# Patient Record
Sex: Male | Born: 1960 | Race: White | Hispanic: No | Smoking: Current every day smoker
Health system: Southern US, Community
[De-identification: ages and names within clinical notes are randomized; demographics above are authoritative.]

## PROBLEM LIST (undated history)

## (undated) DIAGNOSIS — I2510 Atherosclerotic heart disease of native coronary artery without angina pectoris: Secondary | ICD-10-CM

## (undated) DIAGNOSIS — E119 Type 2 diabetes mellitus without complications: Secondary | ICD-10-CM

## (undated) DIAGNOSIS — Q211 Atrial septal defect, unspecified: Secondary | ICD-10-CM

## (undated) DIAGNOSIS — Z8774 Personal history of (corrected) congenital malformations of heart and circulatory system: Secondary | ICD-10-CM

## (undated) DIAGNOSIS — I10 Essential (primary) hypertension: Secondary | ICD-10-CM

## (undated) DIAGNOSIS — M1990 Unspecified osteoarthritis, unspecified site: Secondary | ICD-10-CM

## (undated) DIAGNOSIS — R0902 Hypoxemia: Secondary | ICD-10-CM

## (undated) DIAGNOSIS — Z720 Tobacco use: Secondary | ICD-10-CM

## (undated) DIAGNOSIS — J449 Chronic obstructive pulmonary disease, unspecified: Secondary | ICD-10-CM

## (undated) DIAGNOSIS — E669 Obesity, unspecified: Secondary | ICD-10-CM

## (undated) DIAGNOSIS — I493 Ventricular premature depolarization: Secondary | ICD-10-CM

## (undated) DIAGNOSIS — R05 Cough: Secondary | ICD-10-CM

## (undated) DIAGNOSIS — D751 Secondary polycythemia: Secondary | ICD-10-CM

## (undated) DIAGNOSIS — Z1379 Encounter for other screening for genetic and chromosomal anomalies: Principal | ICD-10-CM

## (undated) DIAGNOSIS — J189 Pneumonia, unspecified organism: Secondary | ICD-10-CM

## (undated) DIAGNOSIS — E785 Hyperlipidemia, unspecified: Secondary | ICD-10-CM

## (undated) DIAGNOSIS — E039 Hypothyroidism, unspecified: Secondary | ICD-10-CM

## (undated) DIAGNOSIS — Z8719 Personal history of other diseases of the digestive system: Secondary | ICD-10-CM

## (undated) DIAGNOSIS — R7303 Prediabetes: Secondary | ICD-10-CM

## (undated) DIAGNOSIS — Z800 Family history of malignant neoplasm of digestive organs: Secondary | ICD-10-CM

## (undated) HISTORY — PX: TONSILLECTOMY: SUR1361

## (undated) HISTORY — DX: Secondary polycythemia: D75.1

## (undated) HISTORY — DX: Obesity, unspecified: E66.9

## (undated) HISTORY — DX: Encounter for other screening for genetic and chromosomal anomalies: Z13.79

## (undated) HISTORY — DX: Hyperlipidemia, unspecified: E78.5

## (undated) HISTORY — DX: Family history of malignant neoplasm of digestive organs: Z80.0

## (undated) HISTORY — PX: OTHER SURGICAL HISTORY: SHX169

## (undated) HISTORY — DX: Atrial septal defect, unspecified: Q21.10

## (undated) HISTORY — DX: Atrial septal defect: Q21.1

## (undated) HISTORY — DX: Atherosclerotic heart disease of native coronary artery without angina pectoris: I25.10

## (undated) HISTORY — DX: Cough: R05

## (undated) HISTORY — DX: Tobacco use: Z72.0

## (undated) HISTORY — DX: Prediabetes: R73.03

## (undated) HISTORY — DX: Personal history of (corrected) congenital malformations of heart and circulatory system: Z87.74

## (undated) HISTORY — DX: Hypothyroidism, unspecified: E03.9

---
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
## 2003-10-03 ENCOUNTER — Encounter: Admission: RE | Admit: 2003-10-03 | Discharge: 2003-10-03 | Payer: Self-pay | Admitting: Internal Medicine

## 2004-11-24 HISTORY — PX: OTHER SURGICAL HISTORY: SHX169

## 2007-01-04 ENCOUNTER — Emergency Department (HOSPITAL_COMMUNITY): Admission: EM | Admit: 2007-01-04 | Discharge: 2007-01-05 | Payer: Self-pay | Admitting: Emergency Medicine

## 2009-01-05 ENCOUNTER — Encounter: Admission: RE | Admit: 2009-01-05 | Discharge: 2009-01-05 | Payer: Self-pay | Admitting: Internal Medicine

## 2009-08-27 ENCOUNTER — Ambulatory Visit: Payer: Self-pay | Admitting: Cardiovascular Disease

## 2009-08-27 ENCOUNTER — Encounter: Payer: Self-pay | Admitting: Emergency Medicine

## 2009-08-28 ENCOUNTER — Inpatient Hospital Stay (HOSPITAL_COMMUNITY): Admission: EM | Admit: 2009-08-28 | Discharge: 2009-08-29 | Payer: Self-pay | Admitting: Cardiology

## 2009-08-28 HISTORY — PX: CARDIAC CATHETERIZATION: SHX172

## 2009-11-24 HISTORY — PX: COLONOSCOPY: SHX174

## 2010-06-19 ENCOUNTER — Encounter: Admission: RE | Admit: 2010-06-19 | Discharge: 2010-06-19 | Payer: Self-pay | Admitting: Internal Medicine

## 2010-07-06 ENCOUNTER — Encounter: Admission: RE | Admit: 2010-07-06 | Discharge: 2010-07-06 | Payer: Self-pay | Admitting: Gastroenterology

## 2010-07-09 ENCOUNTER — Ambulatory Visit: Payer: Self-pay | Admitting: Cardiology

## 2011-01-16 ENCOUNTER — Ambulatory Visit (INDEPENDENT_AMBULATORY_CARE_PROVIDER_SITE_OTHER): Payer: PRIVATE HEALTH INSURANCE | Admitting: Cardiology

## 2011-01-16 DIAGNOSIS — I10 Essential (primary) hypertension: Secondary | ICD-10-CM

## 2011-01-16 DIAGNOSIS — F17200 Nicotine dependence, unspecified, uncomplicated: Secondary | ICD-10-CM

## 2011-01-16 DIAGNOSIS — E7800 Pure hypercholesterolemia, unspecified: Secondary | ICD-10-CM

## 2011-02-27 LAB — LIPID PANEL
Cholesterol: 196 mg/dL (ref 0–200)
HDL: 33 mg/dL — ABNORMAL LOW (ref >39)
LDL Cholesterol: 116 mg/dL — ABNORMAL HIGH (ref 0–99)
Total CHOL/HDL Ratio: 5.9 RATIO
Triglycerides: 235 mg/dL — ABNORMAL HIGH (ref <150)
VLDL: 47 mg/dL — ABNORMAL HIGH (ref 0–40)

## 2011-02-27 LAB — BASIC METABOLIC PANEL
BUN: 14 mg/dL (ref 6–23)
BUN: 26 mg/dL — ABNORMAL HIGH (ref 6–23)
BUN: 26 mg/dL — ABNORMAL HIGH (ref 6–23)
CO2: 26 mEq/L (ref 19–32)
CO2: 27 mEq/L (ref 19–32)
CO2: 25 mEq/L (ref 19–32)
Calcium: 8.8 mg/dL (ref 8.4–10.5)
Calcium: 9 mg/dL (ref 8.4–10.5)
Calcium: 9.6 mg/dL (ref 8.4–10.5)
Chloride: 106 mEq/L (ref 96–112)
Chloride: 108 mEq/L (ref 96–112)
Chloride: 103 mEq/L (ref 96–112)
Creatinine, Ser: 1.02 mg/dL (ref 0.4–1.5)
Creatinine, Ser: 1.09 mg/dL (ref 0.4–1.5)
Creatinine, Ser: 1.13 mg/dL (ref 0.4–1.5)
GFR calc Af Amer: >60 mL/min (ref >60)
GFR calc Af Amer: >60 mL/min (ref >60)
GFR calc Af Amer: >60 mL/min (ref >60)
GFR calc non Af Amer: >60 mL/min (ref >60)
GFR calc non Af Amer: >60 mL/min (ref >60)
GFR calc non Af Amer: >60 mL/min (ref >60)
Glucose, Bld: 122 mg/dL — ABNORMAL HIGH (ref 70–99)
Glucose, Bld: 127 mg/dL — ABNORMAL HIGH (ref 70–99)
Glucose, Bld: 119 mg/dL — ABNORMAL HIGH (ref 70–99)
Potassium: 3.9 mEq/L (ref 3.5–5.1)
Potassium: 4.1 mEq/L (ref 3.5–5.1)
Potassium: 3.9 mEq/L (ref 3.5–5.1)
Sodium: 140 mEq/L (ref 135–145)
Sodium: 142 mEq/L (ref 135–145)
Sodium: 139 mEq/L (ref 135–145)

## 2011-02-27 LAB — DIFFERENTIAL
Basophils Absolute: 0 10*3/uL (ref 0.0–0.1)
Basophils Relative: 0 % (ref 0–1)
Eosinophils Absolute: 0.2 10*3/uL (ref 0.0–0.7)
Eosinophils Relative: 2 % (ref 0–5)
Lymphocytes Relative: 30 % (ref 12–46)
Lymphs Abs: 4 10*3/uL (ref 0.7–4.0)
Monocytes Absolute: 1.2 10*3/uL — ABNORMAL HIGH (ref 0.1–1.0)
Monocytes Relative: 9 % (ref 3–12)
Neutro Abs: 7.9 10*3/uL — ABNORMAL HIGH (ref 1.7–7.7)
Neutrophils Relative %: 59 % (ref 43–77)

## 2011-02-27 LAB — POCT CARDIAC MARKERS
CKMB, poc: 1.3 ng/mL (ref 1.0–8.0)
CKMB, poc: 1.6 ng/mL (ref 1.0–8.0)
Myoglobin, poc: 114 ng/mL (ref 12–200)
Myoglobin, poc: 71.5 ng/mL (ref 12–200)
Troponin i, poc: <0.05 ng/mL (ref 0.00–0.09)
Troponin i, poc: <0.05 ng/mL (ref 0.00–0.09)

## 2011-02-27 LAB — CBC
HCT: 44.9 % (ref 39.0–52.0)
HCT: 47.9 % (ref 39.0–52.0)
Hemoglobin: 15.1 g/dL (ref 13.0–17.0)
Hemoglobin: 16.6 g/dL (ref 13.0–17.0)
MCHC: 33.7 g/dL (ref 30.0–36.0)
MCHC: 34.7 g/dL (ref 30.0–36.0)
MCV: 93.7 fL (ref 78.0–100.0)
MCV: 92.5 fL (ref 78.0–100.0)
Platelets: 181 10*3/uL (ref 150–400)
Platelets: 216 10*3/uL (ref 150–400)
RBC: 4.79 MIL/uL (ref 4.22–5.81)
RBC: 5.17 MIL/uL (ref 4.22–5.81)
RDW: 13.3 % (ref 11.5–15.5)
RDW: 13 % (ref 11.5–15.5)
WBC: 8.5 10*3/uL (ref 4.0–10.5)
WBC: 13.3 10*3/uL — ABNORMAL HIGH (ref 4.0–10.5)

## 2011-02-27 LAB — CARDIAC PANEL(CRET KIN+CKTOT+MB+TROPI)
CK, MB: 1.1 ng/mL (ref 0.3–4.0)
CK, MB: 1.3 ng/mL (ref 0.3–4.0)
CK, MB: 1.4 ng/mL (ref 0.3–4.0)
Relative Index: 0.7 (ref 0.0–2.5)
Relative Index: 0.8 (ref 0.0–2.5)
Relative Index: 0.8 (ref 0.0–2.5)
Total CK: 160 U/L (ref 7–232)
Total CK: 172 U/L (ref 7–232)
Total CK: 184 U/L (ref 7–232)
Troponin I: 0.01 ng/mL (ref 0.00–0.06)
Troponin I: 0.01 ng/mL (ref 0.00–0.06)
Troponin I: 0.01 ng/mL (ref 0.00–0.06)

## 2011-02-27 LAB — PROTIME-INR
INR: 0.95 (ref 0.00–1.49)
Prothrombin Time: 12.6 seconds (ref 11.6–15.2)

## 2011-02-27 LAB — TSH: TSH: 5.085 u[IU]/mL — ABNORMAL HIGH (ref 0.350–4.500)

## 2011-05-08 ENCOUNTER — Other Ambulatory Visit: Payer: Self-pay | Admitting: *Deleted

## 2011-05-08 MED ORDER — METOPROLOL TARTRATE 50 MG PO TABS: ORAL_TABLET | ORAL | Status: DC

## 2011-05-08 NOTE — Telephone Encounter (Signed)
Fax received from pharmacy. Refill completed. Jodette Domingos Riggi RN  

## 2011-12-30 ENCOUNTER — Telehealth: Payer: Self-pay | Admitting: Cardiology

## 2011-12-30 NOTE — Telephone Encounter (Signed)
LOV,StressEcho,12 ( [place] Card) faxed to [place] @ [phone] 12/30/11/KM

## 2012-01-08 ENCOUNTER — Telehealth: Payer: Self-pay

## 2012-01-08 NOTE — Telephone Encounter (Signed)
ATTN: medical records. Patient needs to pick up a copy of his office notes for 09/21/11 and 10/27/11. Please call him when they are ready. Thank you

## 2012-01-29 NOTE — Telephone Encounter (Signed)
Record request printed and done by Elmyra Ricks.

## 2012-01-29 NOTE — Telephone Encounter (Signed)
Has this been taken care of,please f/u

## 2012-02-10 ENCOUNTER — Other Ambulatory Visit: Payer: Self-pay | Admitting: Cardiology

## 2012-02-13 ENCOUNTER — Encounter: Payer: Self-pay | Admitting: *Deleted

## 2012-02-16 ENCOUNTER — Ambulatory Visit (INDEPENDENT_AMBULATORY_CARE_PROVIDER_SITE_OTHER): Payer: PRIVATE HEALTH INSURANCE | Admitting: Nurse Practitioner

## 2012-02-16 ENCOUNTER — Encounter: Payer: Self-pay | Admitting: Nurse Practitioner

## 2012-02-16 VITALS — BP 122/100 | HR 68 | Ht 73.0 in | Wt 248.6 lb

## 2012-02-16 DIAGNOSIS — I2510 Atherosclerotic heart disease of native coronary artery without angina pectoris: Secondary | ICD-10-CM

## 2012-02-16 DIAGNOSIS — Z720 Tobacco use: Secondary | ICD-10-CM

## 2012-02-16 DIAGNOSIS — F17200 Nicotine dependence, unspecified, uncomplicated: Secondary | ICD-10-CM

## 2012-02-16 MED ORDER — METOPROLOL TARTRATE 50 MG PO TABS: 25.0000 mg | ORAL_TABLET | Freq: Two times a day (BID) | ORAL | Status: DC

## 2012-02-16 NOTE — Assessment & Plan Note (Signed)
He has mild CAD per cath back in 2010. Has multiple CV risk factors. Smoking cessation is encouraged. He has had recent labs with his PCP. I have refilled his metoprolol. We will tentatively see him back in one year. Would give consideration for repeat stress testing on his return visit. Patient is agreeable to this plan and will call if any problems develop in the interim.

## 2012-02-16 NOTE — Progress Notes (Signed)
   Chris Long Date of Birth: 1961/10/15 Medical Record #[id number]  History of Present Illness: Mr. Stooksbury is seen today for a follow up visit. He is seen for Dr. [place]. He is a former patient of Dr. Ronnald Nian. He has known mild CAD per cath back in 2010. Last stress echo in 2006. Has had remote ASD repair in 1972. He has ongoing tobacco abuse and HLD. He is here for medication refills.  He comes in today. He is here alone. He is doing ok from our standpoint. No chest pain. Not short of breath. He did try to stop smoking for about 3 weeks last year. Then his mom died. His dog died. His partner's mother died. He returned to smoking. He fell earlier this year and tore his rotator cuff. He had surgery back in February and did fine. He has had his labs checked with Dr. Timothy Lasso.   Current Outpatient Prescriptions on File Prior to Visit  Medication Sig Dispense Refill  . aspirin 325 MG tablet Take 325 mg by mouth daily.      . fish oil-omega-3 fatty acids 1000 MG capsule Take 2 g by mouth daily.      Marland Kitchen levothyroxine (SYNTHROID, LEVOTHROID) 88 MCG tablet Take 88 mcg by mouth daily.      . metoprolol (LOPRESSOR) 50 MG tablet TAKE 1/2 TABLET BY MOUTH TWICE DAILY  15 tablet  0  . Multiple Vitamin (MULTIVITAMIN) capsule Take 1 capsule by mouth daily.      . rosuvastatin (CRESTOR) 10 MG tablet Take 10 mg by mouth daily.        No Known Allergies  Past Medical History  Diagnosis Date  . Coronary artery disease     Mild CAD per cath in 2010  . Hyperlipidemia   . Hypothyroidism   . Obesity   . Tobacco abuse   . ASD (atrial septal defect)     with repair in 1972    Past Surgical History  Procedure Date  . Cardiac catheterization 08/28/2009    EF 60%; Mild CAD with normal LV function  . Asd repair [id number]  . Stress echo test 2006    NO ISCHEMIA, NORMAL    History  Smoking status  . Current Everyday Smoker  Smokeless tobacco  . Not on file    History  Alcohol Use No     Family History  Problem Relation Age of Onset  . Heart attack Mother     X2  . Hypertension Father     Review of Systems: The review of systems is per the HPI.  All other systems were reviewed and are negative.  Physical Exam: BP 122/100  Pulse 68  Ht 6\' 1"  (1.854 m)  Wt 248 lb 9.6 oz (112.764 kg)  BMI 32.80 kg/m2 Patient is very pleasant and in no acute distress. Skin is warm and dry. Color is normal.  HEENT is unremarkable. Normocephalic/atraumatic. PERRL. Sclera are nonicteric. Neck is supple. No masses. No JVD. Lungs are clear. Cardiac exam shows a regular rate and rhythm. Abdomen is soft. Extremities are without edema. Gait and ROM are intact. No gross neurologic deficits noted.   LABORATORY DATA:   Assessment / Plan:

## 2012-02-16 NOTE — Assessment & Plan Note (Signed)
Smoking cessation is encouraged 

## 2012-02-16 NOTE — Patient Instructions (Signed)
I encourage you to try and stop smoking.  I have refilled your Metoprolol today.  Stay active.  We will see you in a year.  Call the [facility] office at [phone] if you have any questions, problems or concerns.

## 2012-04-27 ENCOUNTER — Ambulatory Visit (INDEPENDENT_AMBULATORY_CARE_PROVIDER_SITE_OTHER): Payer: PRIVATE HEALTH INSURANCE | Admitting: Family Medicine

## 2012-04-27 VITALS — BP 146/82 | HR 88 | Temp 98.3°F | Resp 20 | Ht 72.0 in | Wt 247.2 lb

## 2012-04-27 DIAGNOSIS — H5710 Ocular pain, unspecified eye: Secondary | ICD-10-CM

## 2012-04-27 DIAGNOSIS — H109 Unspecified conjunctivitis: Secondary | ICD-10-CM

## 2012-04-27 MED ORDER — TOBRAMYCIN 0.3 % OP SOLN: 1.0000 [drp] | Freq: Four times a day (QID) | OPHTHALMIC | Status: AC

## 2012-04-27 NOTE — Patient Instructions (Signed)
Eye, Foreign Body  A foreign body is an object that should not be there. The object could be near, on, or in the eye.  HOME CARE  If your doctor prescribes an eye patch:   Keep the eye patch on. Do this until you see your doctor again.   Do not remove the patch to put in medicine unless your doctor tells you.   Retape it as it was before:   When replacing the patch.   If the patch comes loose.   Do not drive or use machinery.   Only take medicine as told by your doctor.  If your doctor does not prescribe an eye patch:   Keep the eye closed as much as possible.   Do not rub the eye.   Wear dark glasses in bright light.   Do not wear contact lenses until the eye feels normal, or as told by your doctor.   Wear protective eye covering, especially when using high speed tools.   Only take medicine as told by your doctor.  GET HELP RIGHT AWAY IF:    Your pain gets worse.   Your vision changes.   You have problems with the eye patch.   The injury gets larger.   There is fluid (discharge) coming from the eye.   You get puffiness (swelling) and soreness.   You have an oral temperature above 102 F (38.9 C), not controlled by medicine.   Your baby is older than 3 months with a rectal temperature of 102 F (38.9 C) or higher.   Your baby is 3 months old or younger with a rectal temperature of 100.4 F (38 C) or higher.  MAKE SURE YOU:    Understand these instructions.   Will watch your condition.   Will get help right away if you are not doing well or get worse.  Document Released: 04/30/2010 Document Revised: 10/30/2011 Document Reviewed: 04/30/2010  [employer] Patient Information 2012 [employer].

## 2012-04-27 NOTE — Progress Notes (Signed)
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    O:  Eyelash embedded into the conjunctiva Eye anesth with proparacaine Eyelash removed  A:  Conjunctival f.b./inflammation  P: tobrex

## 2012-08-18 ENCOUNTER — Ambulatory Visit
Admission: RE | Admit: 2012-08-18 | Discharge: 2012-08-18 | Disposition: A | Discharge status: Home / Self Care | Payer: PRIVATE HEALTH INSURANCE | Source: Ambulatory Visit | Attending: Internal Medicine | Admitting: Internal Medicine

## 2012-08-18 ENCOUNTER — Other Ambulatory Visit: Payer: Self-pay | Admitting: Internal Medicine

## 2012-08-18 DIAGNOSIS — R109 Unspecified abdominal pain: Secondary | ICD-10-CM

## 2012-08-18 DIAGNOSIS — R319 Hematuria, unspecified: Secondary | ICD-10-CM

## 2012-11-04 ENCOUNTER — Telehealth: Payer: Self-pay | Admitting: Oncology

## 2012-11-04 NOTE — Telephone Encounter (Signed)
C/D 11/04/12 for appt. 11/12/12

## 2012-11-04 NOTE — Telephone Encounter (Signed)
S/W pt in re NP appt 12/20 @ 10:30 w/Dr. Gaylyn Rong.  Referring Dr. [name] Dx-Eryhrocythosis Welcome packet mailed.

## 2012-11-11 ENCOUNTER — Encounter: Payer: Self-pay | Admitting: Oncology

## 2012-11-11 DIAGNOSIS — D751 Secondary polycythemia: Secondary | ICD-10-CM | POA: Insufficient documentation

## 2012-11-12 ENCOUNTER — Ambulatory Visit: Payer: PRIVATE HEALTH INSURANCE

## 2012-11-12 ENCOUNTER — Encounter: Payer: Self-pay | Admitting: Oncology

## 2012-11-12 ENCOUNTER — Telehealth: Payer: Self-pay | Admitting: Oncology

## 2012-11-12 ENCOUNTER — Other Ambulatory Visit (HOSPITAL_BASED_OUTPATIENT_CLINIC_OR_DEPARTMENT_OTHER): Payer: PRIVATE HEALTH INSURANCE | Admitting: Lab

## 2012-11-12 ENCOUNTER — Ambulatory Visit (HOSPITAL_BASED_OUTPATIENT_CLINIC_OR_DEPARTMENT_OTHER): Payer: PRIVATE HEALTH INSURANCE | Admitting: Oncology

## 2012-11-12 VITALS — BP 128/78 | HR 73 | Temp 97.9°F | Resp 18 | Ht 73.0 in | Wt 217.0 lb

## 2012-11-12 DIAGNOSIS — D45 Polycythemia vera: Secondary | ICD-10-CM

## 2012-11-12 DIAGNOSIS — F17200 Nicotine dependence, unspecified, uncomplicated: Secondary | ICD-10-CM

## 2012-11-12 DIAGNOSIS — D751 Secondary polycythemia: Secondary | ICD-10-CM

## 2012-11-12 LAB — CBC WITH DIFFERENTIAL/PLATELET
Basophils Absolute: 0.1 10*3/uL (ref 0.0–0.1)
Eosinophils Absolute: 0.2 10*3/uL (ref 0.0–0.5)
HCT: 51.9 % — ABNORMAL HIGH (ref 38.4–49.9)
HGB: 18 g/dL — ABNORMAL HIGH (ref 13.0–17.1)
LYMPH%: 33 % (ref 14.0–49.0)
MCV: 93 fL (ref 79.3–98.0)
MONO#: 0.8 10*3/uL (ref 0.1–0.9)
MONO%: 8.8 % (ref 0.0–14.0)
NEUT#: 5.2 10*3/uL (ref 1.5–6.5)
NEUT%: 55.4 % (ref 39.0–75.0)
Platelets: 202 10*3/uL (ref 140–400)
RBC: 5.58 10*6/uL (ref 4.20–5.82)
WBC: 9.4 10*3/uL (ref 4.0–10.3)

## 2012-11-12 LAB — CHCC SMEAR

## 2012-11-12 NOTE — Progress Notes (Signed)
[facility]  [phone] [phone]     INITIAL HEMATOLOGY CONSULTATION    Referral MD:  Dr. [name], M.D.  Reason for Referral: polycythemia.     HPI:  Mr. Chris Long is a 51 year-old man with current smoking habit, obesity among others.  He was recently noted by his PCP for slight leukocytosis and polycythemia.  A routine CBC on 11/01/2012 showed WBC 11.4; Hgb 19; Plt 211.  He was kindly referred to the [facility] for evaluation.  Chris Long presented to the Clinic by himself today.  He reported felling well.  He has been trying to lose weight intentionally. His face is rudy; however, this has been like this for years.  He denied fatigue, anorexia, adenopathy, erythromelalgia.  The rest of the 14 point review of system was negative.     Past Medical History  Diagnosis Date  . Coronary artery disease     Mild CAD per cath in 2010  . Hyperlipidemia   . Hypothyroidism   . Obesity   . Tobacco abuse   . ASD (atrial septal defect)     with repair in 1972  . Polycythemia   . Prediabetes   :    Past Surgical History  Procedure Date  . Cardiac catheterization 08/28/2009    EF 60%; Mild CAD with normal LV function  . Asd repair [id number]  . Stress echo test 2006    NO ISCHEMIA, NORMAL  . Tonsillectomy   . Colonoscopy 2011    outlaw; 5 polyps   :   CURRENT MEDS: Current Outpatient Prescriptions  Medication Sig Dispense Refill  . aspirin 325 MG tablet Take 325 mg by mouth daily.      . fish oil-omega-3 fatty acids 1000 MG capsule Take 2 g by mouth daily.      Marland Kitchen levothyroxine (SYNTHROID, LEVOTHROID) 88 MCG tablet Take 88 mcg by mouth daily.      . metoprolol (LOPRESSOR) 50 MG tablet Take 0.5 tablets (25 mg total) by mouth 2 (two) times daily.  90 tablet  3  . Multiple Vitamin (MULTIVITAMIN) capsule Take 1 capsule by mouth daily.      . rosuvastatin (CRESTOR) 10 MG tablet Take 20 mg by mouth daily.           No  Known Allergies:  Family History  Problem Relation Age of Onset  . Heart attack Mother     X2  . Heart failure Mother   . Hypertension Father   . COPD Father   . Cancer Father     bladder  . Cancer Paternal Uncle     GI cancer  :  History   Social History  . Marital Status: Divorced    Spouse Name: N/A    Number of Children: 0  . Years of Education: N/A   Occupational History  .      real estate.     Social History Main Topics  . Smoking status: Current Every Day Smoker -- 0.5 packs/day for 30 years    Types: Cigarettes  . Smokeless tobacco: Never Used  . Alcohol Use: No  . Drug Use: No  . Sexually Active: Yes   Other Topics Concern  . Not on file   Social History Narrative  . No narrative on file  :  REVIEW OF SYSTEM:  The rest of the 14-point review of sytem was negative.   Exam: ECOG 0  General:  well-nourished man, in  no acute distress.  Eyes:  no scleral icterus.  ENT:  There were no oropharyngeal lesions.  Neck was without thyromegaly.  Lymphatics:  Negative cervical, supraclavicular or axillary adenopathy.  Respiratory: lungs were clear bilaterally without wheezing or crackles.  Cardiovascular:  Regular rate and rhythm, S1/S2, without murmur, rub or gallop.  There was no pedal edema.  GI:  abdomen was soft, flat, nontender, nondistended, without organomegaly.  Muscoloskeletal:  no spinal tenderness of palpation of vertebral spine.  Skin exam was without echymosis, petichae.  Neuro exam was nonfocal.  Patient was able to get on and off exam table without assistance.  Gait was normal.  Patient was alerted and oriented.  Attention was good.   Language was appropriate.  Mood was normal without depression.  Speech was not pressured.  Thought content was not tangential.    LABS:  Lab Results  Component Value Date   WBC 9.4 11/12/2012   HGB 18.0* 11/12/2012   HCT 51.9* 11/12/2012   PLT 202 11/12/2012   GLUCOSE 127* 08/29/2009   CHOL  Value: 196        ATP III  CLASSIFICATION:  <200     mg/dL   Desirable  161-096  mg/dL   Borderline High  >=045    mg/dL   High        40/07/8118   TRIG 235* 08/28/2009   HDL 33* 08/28/2009   LDLCALC  Value: 116        Total Cholesterol/HDL:CHD Risk Coronary Heart Disease Risk Table                     Men   Women  1/2 Average Risk   3.4   3.3  Average Risk       5.0   4.4  2 X Average Risk   9.6   7.1  3 X Average Risk  23.4   11.0        Use the calculated Patient Ratio above and the CHD Risk Table to determine the patient's CHD Risk.        ATP III CLASSIFICATION (LDL):  <100     mg/dL   Optimal  147-829  mg/dL   Near or Above                    Optimal  130-159  mg/dL   Borderline  562-130  mg/dL   High  >865     mg/dL   Very High* 78/02/6961   NA 142 08/29/2009   K 4.1 08/29/2009   CL 108 08/29/2009   CREATININE 1.02 08/29/2009   BUN 14 DELTA CHECK NOTED 08/29/2009   CO2 26 08/29/2009   INR 0.95 08/28/2009     ASSESSMENT AND PLAN:   1.  Smoking:  I advised him to stop smoking since he has family history of vascular disease.  He is trying to use electronic cigarettes. 2.  HLP:  He is on rosuvastatin. 3.  Hypothyroidism:  He is on Synthroid. 4.  Polycythemia.  - Potential causes:  Smoking, COPD, obesity, sleep apnea.  Need to rule out polycythemia vera (PV).  PV is a condition where the bone marrow independently produces red blood cell without feedback mechanism.  PV patients are at slightly higher risk of blood clot. -  Work up:  JAK-2 mutation testing.   Consider work up for sleep apnea if hasn't done so.   - Treatment:  Aspirin 325mg  PO daily.  Stop smoking.  CPAP if he has OSA.  Even if testing shows PV, there is no indication for Hydrea given that he is only 52 years old without history of thrombosis. I will arrange for phlebotomy if he has PV.  There is no benefit in phlebotomy in patients with secondary polycythemia.  -  Follow up: CBC at the [facility] in about 6 months.  Follow up in about 1 year.  In the future, if  your Hgb significantly increases, we may consider bone marrow biopsy.  At this time, bone marrow biopsy has low utility.   Chris Long expressed informed understanding and wished to follow up with the recommendation.     Thank you for this referral.

## 2012-11-12 NOTE — Telephone Encounter (Signed)
appts made from chart note,no pof     anne

## 2012-11-12 NOTE — Patient Instructions (Addendum)
1.  Issue:  Elevated red blood cell. 2.  Potential causes:  Smoking, COPD, obesity, sleep apnea.  Need to rule out polycythemia vera (PV).  PV is a condition where the bone marrow independently produces red blood cell without feedback mechanism.  PV patients are at slightly higher risk of blood clot. 3.  Work up:  JAK-2 mutation testing.   Consider work up for sleep apnea if hasn't done so? 4.  Follow up:  Lab test in about 6 months.  Follow up in about 1 year.  In the future, if your Hgb significantly increases, we may consider bone marrow biopsy.  At this time, bone marrow biopsy has low utility.

## 2012-11-12 NOTE — Progress Notes (Signed)
Checked in new pt with non financial concerns.

## 2012-11-13 LAB — HEPATIC FUNCTION PANEL
ALT: 23 U/L (ref 0–53)
AST: 17 U/L (ref 0–37)
Albumin: 4.3 g/dL (ref 3.5–5.2)
Alkaline Phosphatase: 65 U/L (ref 39–117)
Total Bilirubin: 0.6 mg/dL (ref 0.3–1.2)

## 2012-11-18 ENCOUNTER — Encounter: Payer: Self-pay | Admitting: Oncology

## 2012-12-25 ENCOUNTER — Ambulatory Visit: Payer: BC Managed Care – PPO

## 2012-12-25 ENCOUNTER — Ambulatory Visit (INDEPENDENT_AMBULATORY_CARE_PROVIDER_SITE_OTHER): Payer: BC Managed Care – PPO | Admitting: Family Medicine

## 2012-12-25 VITALS — BP 124/79 | HR 96 | Temp 98.0°F | Resp 16 | Ht 73.0 in | Wt 242.6 lb

## 2012-12-25 DIAGNOSIS — M79605 Pain in left leg: Secondary | ICD-10-CM

## 2012-12-25 DIAGNOSIS — M79662 Pain in left lower leg: Secondary | ICD-10-CM

## 2012-12-25 DIAGNOSIS — S86119A Strain of other muscle(s) and tendon(s) of posterior muscle group at lower leg level, unspecified leg, initial encounter: Secondary | ICD-10-CM

## 2012-12-25 DIAGNOSIS — S838X9A Sprain of other specified parts of unspecified knee, initial encounter: Secondary | ICD-10-CM

## 2012-12-25 DIAGNOSIS — S86819A Strain of other muscle(s) and tendon(s) at lower leg level, unspecified leg, initial encounter: Secondary | ICD-10-CM

## 2012-12-25 DIAGNOSIS — M79609 Pain in unspecified limb: Secondary | ICD-10-CM

## 2012-12-25 NOTE — Patient Instructions (Signed)
Medial Head Gastrocnemius Tear (Tennis Leg)  with Rehab Medial head gastrocnemius tear, also called tennis leg, is a tear (strain) in a muscle or tendon of the inner portion (medial head) of one of the calf muscles (gastrocnemius). The inner portion of the calf muscle attaches to the thigh bone (femur) and is responsible for bending the knee and straightening the foot (standing on "tippy toes"). Strains are classified into three categories. Grade 1 strains cause pain, but the tendon is not lengthened. Grade 2 strains include a lengthened ligament, due to the ligament being stretched or partially ruptured. With grade 2 strains there is still function, although function may be decreased. Grade 3 strains involve a complete tear of the tendon or muscle, and function is usually impaired. SYMPTOMS   Sudden "pop" or tear felt at the time of injury.  Pain, tenderness, swelling, warmth, or redness over the middle inner calf.  Pain and weakness with ankle motion, especially flexing the ankle against resistance, as well as pain with lifting the foot up (extending the ankle).  Bruising (contusion) of the calf, heel and, sometimes, foot within 48 hours of injury.  Muscle spasm in the calf. CAUSES  Muscle and ligament strains occur when a force is placed on the muscle or ligament that is greater than it can handle. Common causes of injury include:  Direct hit (trauma) to the calf.  Sudden forceful pushing off or landing on the foot (jumping, landing, serving a tennis ball, lunging). RISK INCREASES WITH:  Sports that require sudden, explosive calf muscle contraction, such as those involving jumping (basketball), hill running, quick starts (running), or lunging (racquetball, tennis).  Contact sports (football, soccer, hockey).  Poor strength and flexibility.  Previous lower limb injury. PREVENTION  Warm up and stretch properly before activity.  Allow for adequate recovery between  workouts.  Maintain physical fitness:  Strength, flexibility, and endurance.  Cardiovascular fitness.  Learn and use proper exercise technique.  Complete rehabilitation after lower limb injury, before returning to competition or practice. PROGNOSIS  If treated properly, tennis leg usually heals within 6 weeks of non-surgical treatment.  RELATED COMPLICATIONS   Longer healing time, if not properly treated or if not given enough time to heal.  Recurring symptoms and injury, if activity is resumed too soon, with overuse, with a direct blow, or with poor technique.  If untreated, may progress to a complete tear (rare) or other injury, due to limping and favoring of the injured leg.  Persistent limping, due to scarring and shortening of the calf muscles, as a result of inadequate rehabilitation.  Prolonged disability. TREATMENT  Treatment first involves the use of ice and medication to help reduce pain and inflammation. The use of strengthening and stretching exercises may help reduce pain with activity. These exercises may be performed at home or with a therapist. For severe injuries, referral to a therapist may be needed for further evaluation and treatment. Your caregiver may advise that you wear a brace to help healing. Sometimes, crutches are needed until you can walk without limping. Rarely, surgery is needed.  MEDICATION   If pain medicine is needed, nonsteroidal anti-inflammatory medicines (aspirin and ibuprofen), or other minor pain relievers (acetaminophen), are often advised.  Do not take pain medicine for 7 days before surgery.  Prescription pain relievers may be given, if your caregiver thinks they are needed. Use only as directed and only as much as you need. HEAT AND COLD  Cold treatment (icing) should be applied for 10 to   15 minutes every 2 to 3 hours for inflammation and pain, and immediately after activity that aggravates your symptoms. Use ice packs or an ice  massage.  Heat treatment may be used before performing stretching and strengthening activities prescribed by your caregiver, physical therapist, or athletic trainer. Use a heat pack or a warm water soak. SEEK MEDICAL CARE IF:   Symptoms get worse or do not improve in 2 weeks, despite treatment.  Numbness or tingling develops.  New, unexplained symptoms develop. (Drugs used in treatment may produce side effects.) EXERCISES  RANGE OF MOTION (ROM) AND STRETCHING EXERCISES - Medial Head Gastrocnemius Tear (Tennis Leg) These exercises may help you when beginning to rehabilitate your injury. Your symptoms may resolve with or without further involvement from your physician, physical therapist or athletic trainer. While completing these exercises, remember:   Restoring tissue flexibility helps normal motion to return to the joints. This allows healthier, less painful movement and activity.  An effective stretch should be held for at least 30 seconds.  A stretch should never be painful. You should only feel a gentle lengthening or release in the stretched tissue. STRETCH - Gastrocsoleus  Sit with your right / left leg extended. Holding onto both ends of a belt or towel, loop it around the ball of your foot.  Keeping your right / left ankle and foot relaxed and your knee straight, pull your foot and ankle toward you using the belt.  You should feel a gentle stretch behind your calf or knee. Hold this position for __________ seconds. Repeat __________ times. Complete this stretch __________ times per day.  RANGE OF MOTION - Ankle Dorsiflexion, Active Assisted   Remove your shoes and sit on a chair, preferably not on a carpeted surface.  Place your right / left foot directly under the knee. Extend your opposite leg for support.  Keeping your heel down, slide your right / left foot back toward the chair, until you feel a stretch at your ankle or calf. If you do not feel a stretch, slide your  bottom forward to the edge of the chair, while still keeping your heel down.  Hold this stretch for __________ seconds. Repeat __________ times. Complete this stretch __________ times per day.  STRETCH  Gastroc, Standing   Place your hands on a wall.  Extend your right / left leg behind you, keeping the front knee somewhat bent.  Slightly point your toes inward on your back foot.  Keeping your right / left heel on the floor and your knee straight, shift your weight toward the wall, not allowing your back to arch.  You should feel a gentle stretch in the right / left calf. Hold this position for __________ seconds. Repeat __________ times. Complete this stretch __________ times per day. STRETCH  Soleus, Standing   Place your hands on a wall.  Extend your right / left leg behind you, keeping the other knee somewhat bent.  Slightly point your toes inward on your back foot.  Keep your right / left heel on the floor, bend your back knee, and slightly shift your weight over the back leg so that you feel a gentle stretch deep in your back calf.  Hold this position for __________ seconds. Repeat __________ times. Complete this stretch __________ times per day. STRETCH  Gastrocsoleus, Standing Note: This exercise can place a lot of stress on your foot and ankle. Please complete this exercise only if specifically instructed by your caregiver.   Place the ball   of your right / left foot on a step, keeping your other foot firmly on the same step.  Hold on to the wall or a rail for balance.  Slowly lift your other foot, allowing your body weight to press your heel down over the edge of the step.  You should feel a stretch in your right / left calf.  Hold this position for __________ seconds.  Repeat this exercise with a slight bend in your right / left knee. Repeat __________ times. Complete this stretch __________ times per day.  STRENGTHENING EXERCISES - Medial Head Gastrocnemius Tear  (Tennis Leg) These exercises may help you when beginning to rehabilitate your injury. They may resolve your symptoms with or without further involvement from your physician, physical therapist or athletic trainer. While completing these exercises, remember:   Muscles can gain both the endurance and the strength needed for everyday activities through controlled exercises.  Complete these exercises as instructed by your physician, physical therapist or athletic trainer. Increase the resistance and repetitions only as guided by your caregiver. STRENGTH - Plantar-flexors  Sit with your right / left leg extended. Holding onto both ends of a rubber exercise band or tubing, loop it around the ball of your foot. Keep a slight tension in the band.  Slowly push your toes away from you, pointing them downward.  Hold this position for __________ seconds. Return slowly, controlling the tension in the band. Repeat __________ times. Complete this exercise __________ times per day.  STRENGTH - Plantar-flexors  Stand with your feet shoulder width apart. Steady yourself with a wall or table, using as little support as needed.  Keeping your weight evenly spread over the width of your feet, rise up on your toes.*  Hold this position for __________ seconds. Repeat __________ times. Complete this exercise __________ times per day.  *If this is too easy, shift your weight toward your right / left leg until you feel challenged. Ultimately, you may be asked to do this exercise while standing on your right / left foot only. STRENGTH  Plantar-flexors, Eccentric Note: This exercise can place a lot of stress on your foot and ankle. Please complete this exercise only if specifically instructed by your caregiver.   Place the balls of your feet on a step. With your hands, use only enough support from a wall or rail to keep your balance.  Keep your knees straight and rise up on your toes.  Slowly shift your weight  entirely to your right / left toes and pick up your opposite foot. Gently and with controlled movement, lower your weight through your right / left foot so that your heel drops below the level of the step. You will feel a slight stretch in the back of your right / left calf.  Use the healthy leg to help rise up onto the balls of both feet, then lower weight only onto the right / left leg again. Build up to 15 repetitions. Then progress to 3 sets of 15 repetitions.*  After completing the above exercise, complete the same exercise with a slight knee bend (about 30 degrees). Again, build up to 15 repetitions. Then progress to 3 sets of 15 repetitions.* Perform this exercise __________ times per day.  *When you easily complete 3 sets of 15, your physician, physical therapist or athletic trainer may advise you to add resistance, by wearing a backpack filled with additional weight. Document Released: 11/10/2005 Document Revised: 02/02/2012 Document Reviewed: 02/22/2009 [facility] Patient Information 2013 [place], [place].

## 2012-12-25 NOTE — Progress Notes (Signed)
[facility]:  Office Visit  Chief Complaint:  Chief Complaint  Patient presents with  . Leg Pain    walking today and felt something pop is his left calf    HPI: Chris Long is a 52 y.o. male who complains of acute left calf pain s/p hearing a pop while walking this AM at work. Has tried ice for it. No recent  prior injuries/surgeries. NO SOB. He had swelling he felt and pain with ambulation and certain ROM of foot. 8-10/10 when moving.   Past Medical History  Diagnosis Date  . Coronary artery disease     Mild CAD per cath in 2010  . Hyperlipidemia   . Hypothyroidism   . Obesity   . Tobacco abuse   . ASD (atrial septal defect)     with repair in 1972  . Polycythemia   . Prediabetes    Past Surgical History  Procedure Date  . Cardiac catheterization 08/28/2009    EF 60%; Mild CAD with normal LV function  . Asd repair [id number]  . Stress echo test 2006    NO ISCHEMIA, NORMAL  . Tonsillectomy   . Colonoscopy 2011    outlaw; 5 polyps    History   Social History  . Marital Status: Divorced    Spouse Name: N/A    Number of Children: 0  . Years of Education: N/A   Occupational History  .      real estate.     Social History Main Topics  . Smoking status: Current Every Day Smoker -- 0.5 packs/day for 30 years    Types: Cigarettes  . Smokeless tobacco: Never Used  . Alcohol Use: No  . Drug Use: No  . Sexually Active: Yes   Other Topics Concern  . None   Social History Narrative  . None   Family History  Problem Relation Age of Onset  . Heart attack Mother     X2  . Heart failure Mother   . Hypertension Father   . COPD Father   . Cancer Father     bladder  . Cancer Paternal Uncle     GI cancer   No Known Allergies Prior to Admission medications   Medication Sig Start Date End Date Taking? Authorizing Provider  aspirin 325 MG tablet Take 325 mg by mouth daily.   Yes Historical Provider, MD  fish oil-omega-3 fatty acids 1000 MG  capsule Take 2 g by mouth daily.   Yes Historical Provider, MD  levothyroxine (SYNTHROID, LEVOTHROID) 88 MCG tablet Take 88 mcg by mouth daily.   Yes Historical Provider, MD  metoprolol (LOPRESSOR) 50 MG tablet Take 0.5 tablets (25 mg total) by mouth 2 (two) times daily. 02/16/12  Yes Rosalio Macadamia, NP  Multiple Vitamin (MULTIVITAMIN) capsule Take 1 capsule by mouth daily.   Yes Historical Provider, MD  rosuvastatin (CRESTOR) 10 MG tablet Take 20 mg by mouth daily.    Yes Historical Provider, MD     ROS: The patient denies fevers, chills, night sweats, unintentional weight loss, chest pain, palpitations, wheezing, dyspnea on exertion, nausea, vomiting, abdominal pain, dysuria, hematuria, melena, numbness, weakness, or tingling.   All other systems have been reviewed and were otherwise negative with the exception of those mentioned in the HPI and as above.    PHYSICAL EXAM: Filed Vitals:   12/25/12 1746  BP: 124/79  Pulse: 96  Temp: 98 F (36.7 C)  Resp: 16   Filed Vitals:  12/25/12 1746  Height: 6\' 1"  (1.854 m)  Weight: 242 lb 9.6 oz (110.043 kg)   Body mass index is 32.01 kg/(m^2).  General: Alert, no acute distress HEENT:  Normocephalic, atraumatic, oropharynx patent.  Cardiovascular:  Regular rate and rhythm, no rubs murmurs or gallops.  No Carotid bruits, radial pulse intact. No pedal edema.  Respiratory: Clear to auscultation bilaterally.  No wheezes, rales, or rhonchi.  No cyanosis, no use of accessory musculature GI: No organomegaly, abdomen is soft and non-tender, positive bowel sounds.  No masses. Skin: No rashes. Neurologic: Facial musculature symmetric. Psychiatric: Patient is appropriate throughout our interaction. Lymphatic: No cervical lymphadenopathy Musculoskeletal: Gait intact. + left medial calf tenderness, slight warmath but on both sides. + pain with ankle ROM, dorsiflexion. Nopain with plantarflexion.  + DP, sensationintact. 17 cm diameter of calf on both  sides.   LABS: Results for orders placed in visit on 11/12/12  CBC WITH DIFFERENTIAL      Component Value Range   WBC 9.4  4.0 - 10.3 10e3/uL   NEUT# 5.2  1.5 - 6.5 10e3/uL   HGB 18.0 (*) 13.0 - 17.1 g/dL   HCT 16.1 (*) 09.6 - 04.5 %   Platelets 202  140 - 400 10e3/uL   MCV 93.0  79.3 - 98.0 fL   MCH 32.3  27.2 - 33.4 pg   MCHC 34.7  32.0 - 36.0 g/dL   RBC 4.09  8.11 - 9.14 10e6/uL   RDW 13.1  11.0 - 14.6 %   lymph# 3.1  0.9 - 3.3 10e3/uL   MONO# 0.8  0.1 - 0.9 10e3/uL   Eosinophils Absolute 0.2  0.0 - 0.5 10e3/uL   Basophils Absolute 0.1  0.0 - 0.1 10e3/uL   NEUT% 55.4  39.0 - 75.0 %   LYMPH% 33.0  14.0 - 49.0 %   MONO% 8.8  0.0 - 14.0 %   EOS% 2.2  0.0 - 7.0 %   BASO% 0.6  0.0 - 2.0 %  HEPATIC FUNCTION PANEL      Component Value Range   Total Bilirubin 0.6  0.3 - 1.2 mg/dL   Bilirubin, Direct 0.1  0.0 - 0.3 mg/dL   Indirect Bilirubin 0.5  0.0 - 0.9 mg/dL   Alkaline Phosphatase 65  39 - 117 U/L   AST 17  0 - 37 U/L   ALT 23  0 - 53 U/L   Total Protein 6.7  6.0 - 8.3 g/dL   Albumin 4.3  3.5 - 5.2 g/dL  [facility] SMEAR      Component Value Range   Smear Result Smear Available       EKG/XRAY:   Primary read interpreted by Dr. Conley Rolls at [facility]. No fx/dislocation, ? Soft tissue swelling   ASSESSMENT/PLAN: Encounter Diagnoses  Name Primary?  . Left leg pain Yes  . Pain of left calf    Left Gastrocnemius strain/sprain vs tear Crutches and camwalker Weightbearing as tolerated Patient declined rx meds, will take OTC NSAIDs prn Monitor for blood clot sxs F/u in 1 week prn   LE, THAO PHUONG, DO 12/25/2012 7:12 PM

## 2013-03-16 ENCOUNTER — Telehealth: Payer: Self-pay | Admitting: Neurology

## 2013-03-16 NOTE — Telephone Encounter (Signed)
Dr. [name] is referring this patient to rule out OSA.  Wt. 239 lbs., HT. 72 in., BMI 32.41  Obesity Witnessed Apneas Snoring Fatigue Excessive Daytime Sleepiness   Medications: Aspirin 325 MG Fish oil-omega 1000 MG Levothyroxine 88 MCG Metoprolol 50 MG Multivitamin Rosuvastatin 10 MG Chantix 0.5 MG    Dr. [name] requesting sleep study for this patient due to risk of sleep apnea.  Patient notes excessive daytime sleepiness, witnessed apneas, and non-restorative sleep.  He endorses [facility] at 9.  Insurance: [employer]

## 2013-03-17 ENCOUNTER — Other Ambulatory Visit: Payer: Self-pay | Admitting: Neurology

## 2013-03-17 DIAGNOSIS — G4733 Obstructive sleep apnea (adult) (pediatric): Secondary | ICD-10-CM

## 2013-03-22 ENCOUNTER — Telehealth: Payer: Self-pay | Admitting: *Deleted

## 2013-03-22 NOTE — Telephone Encounter (Signed)
Message left requesting a call back to schedule a sleep study

## 2013-03-30 ENCOUNTER — Telehealth: Payer: Self-pay | Admitting: *Deleted

## 2013-03-30 NOTE — Telephone Encounter (Signed)
Message left that we have a referral from Dr. Timothy Lasso for a Sleep Study.  Please call to schedule.

## 2013-04-19 ENCOUNTER — Ambulatory Visit (INDEPENDENT_AMBULATORY_CARE_PROVIDER_SITE_OTHER): Payer: BC Managed Care – PPO | Admitting: Neurology

## 2013-04-19 VITALS — BP 126/78

## 2013-04-19 DIAGNOSIS — G4710 Hypersomnia, unspecified: Secondary | ICD-10-CM

## 2013-04-19 DIAGNOSIS — G4733 Obstructive sleep apnea (adult) (pediatric): Secondary | ICD-10-CM

## 2013-04-19 DIAGNOSIS — R0683 Snoring: Secondary | ICD-10-CM

## 2013-04-20 ENCOUNTER — Telehealth: Payer: Self-pay | Admitting: Neurology

## 2013-04-20 NOTE — Telephone Encounter (Signed)
Please change provider to me, my order was a SPLIt at lowest  AHI possible, 3% score. CD

## 2013-04-20 NOTE — Patient Instructions (Signed)

## 2013-05-05 ENCOUNTER — Telehealth: Payer: Self-pay | Admitting: *Deleted

## 2013-05-05 NOTE — Telephone Encounter (Signed)
Left message for patient regarding sleep study results, asked patient to call me back to discuss results and have questions answered.  Explained that a copy of the sleep study was sent to referring physician and copy of study is coming to them in the mail.  Mentioned that Dr. Vickey Huger states she will be happy to meet with patient to discuss treatment options.  Study was faxed to Dr. [name] 05/05/2013

## 2013-05-10 ENCOUNTER — Encounter: Payer: Self-pay | Admitting: *Deleted

## 2013-05-10 NOTE — Progress Notes (Deleted)
Subjective:    Patient ID: Chris Long is a 52 y.o. male.  HPI {Common ambulatory SmartLinks:[id number]}  Review of Systems  Objective:  Neurologic Exam  Physical Exam  Assessment:   ***  Plan:   ***

## 2013-05-10 NOTE — Progress Notes (Signed)
See media tab for full report  

## 2013-05-10 NOTE — Progress Notes (Signed)
Quick Note:  This study was read and signed on 04-25-13 . Marvell Stavola, MD  ______

## 2013-05-12 ENCOUNTER — Other Ambulatory Visit: Payer: Self-pay | Admitting: Oncology

## 2013-05-12 DIAGNOSIS — D751 Secondary polycythemia: Secondary | ICD-10-CM

## 2013-05-13 ENCOUNTER — Other Ambulatory Visit: Payer: PRIVATE HEALTH INSURANCE | Admitting: Lab

## 2013-11-10 ENCOUNTER — Other Ambulatory Visit: Payer: Self-pay | Admitting: Hematology and Oncology

## 2013-11-10 DIAGNOSIS — D751 Secondary polycythemia: Secondary | ICD-10-CM

## 2013-11-11 ENCOUNTER — Ambulatory Visit: Payer: PRIVATE HEALTH INSURANCE | Admitting: Hematology and Oncology

## 2013-11-11 ENCOUNTER — Other Ambulatory Visit: Payer: PRIVATE HEALTH INSURANCE

## 2013-11-11 ENCOUNTER — Encounter: Payer: Self-pay | Admitting: *Deleted

## 2014-01-02 ENCOUNTER — Telehealth: Payer: Self-pay | Admitting: Hematology and Oncology

## 2014-01-02 NOTE — Telephone Encounter (Signed)
NEW PATIENT SCHEDULED FOR 02/10 @ 8 W/DR. [place].  REFERRING DR. Jenny Reichmann RUSSO DX- ERYTHROCYTOSIS.   PATIENT CONFIRMED APPT

## 2014-01-02 NOTE — Telephone Encounter (Signed)
C/D 01/02/14 for appt. 01/03/14

## 2014-01-03 ENCOUNTER — Encounter: Payer: Self-pay | Admitting: Hematology and Oncology

## 2014-01-03 ENCOUNTER — Ambulatory Visit: Payer: 59

## 2014-01-03 ENCOUNTER — Encounter (INDEPENDENT_AMBULATORY_CARE_PROVIDER_SITE_OTHER): Payer: Self-pay

## 2014-01-03 ENCOUNTER — Telehealth: Payer: Self-pay | Admitting: Hematology and Oncology

## 2014-01-03 ENCOUNTER — Ambulatory Visit (HOSPITAL_BASED_OUTPATIENT_CLINIC_OR_DEPARTMENT_OTHER): Payer: 59 | Admitting: Hematology and Oncology

## 2014-01-03 VITALS — BP 123/81 | HR 72 | Temp 98.5°F | Resp 18 | Ht 73.0 in | Wt 248.2 lb

## 2014-01-03 DIAGNOSIS — F17200 Nicotine dependence, unspecified, uncomplicated: Secondary | ICD-10-CM

## 2014-01-03 DIAGNOSIS — E669 Obesity, unspecified: Secondary | ICD-10-CM

## 2014-01-03 DIAGNOSIS — G4730 Sleep apnea, unspecified: Secondary | ICD-10-CM

## 2014-01-03 DIAGNOSIS — D751 Secondary polycythemia: Secondary | ICD-10-CM

## 2014-01-03 NOTE — Progress Notes (Signed)
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 Peripheral blood for JAK 2 mutation was negative. The patient was advised to stop smoking. He also underwent sleep study and was told he had mild obstructive sleep apnea but declined treatment for now INTERVAL HISTORY: Chris Long 53 y.o. male returns for further followup. His primary care referred the patient back here because of persistent erythrocytosis. On 12/13/2013, repeat CBC showed white count 10.7, hemoglobin 18.9, hematocrit of 53.5% and platelet count of 222,000. He denies any sensation of headache, leg cramps, chest pain or shortness of breath. Denies prior diagnosis of blood clots. The patient continued to smoke half a packs of cigarettes a day and is attempting to quit on his own.  I have reviewed the past medical history, past surgical history, social history and family history with the patient and they are unchanged from previous note.  ALLERGIES:  has No Known Allergies.  MEDICATIONS:  Current Outpatient Prescriptions  Medication Sig Dispense Refill  . aspirin 325 MG tablet Take 325 mg by mouth daily.      . fish oil-omega-3 fatty acids 1000 MG capsule Take 2 g by mouth daily.      Marland Kitchen levothyroxine (SYNTHROID, LEVOTHROID) 88 MCG tablet Take 88 mcg by mouth daily.      . metoprolol (LOPRESSOR) 50 MG tablet Take 0.5 tablets (25 mg total) by mouth 2 (two) times daily.  90 tablet  3  . Multiple Vitamin (MULTIVITAMIN) capsule Take 1 capsule by mouth daily.      . rosuvastatin (CRESTOR) 10 MG tablet Take 20 mg by mouth daily.        No current facility-administered medications for this visit.     REVIEW OF SYSTEMS:   Constitutional: Denies fevers, chills or night sweats Eyes:  Denies blurriness of vision Ears, nose, mouth, throat, and face: Denies mucositis or sore throat Respiratory: Denies cough, dyspnea or wheezes Cardiovascular: Denies palpitation, chest discomfort or lower extremity swelling Gastrointestinal:  Denies nausea, heartburn or change in bowel habits Skin: Denies abnormal skin rashes Lymphatics: Denies new lymphadenopathy or easy bruising Neurological:Denies numbness, tingling or new weaknesses Behavioral/Psych: Mood is stable, no new changes  All other systems were reviewed with the patient and are negative.  PHYSICAL EXAMINATION: ECOG PERFORMANCE STATUS: 0 - Asymptomatic  Filed Vitals:   01/03/14 0811  BP: 123/81  Pulse: 72  Temp: 98.5 F (36.9 C)  Resp: 18   Filed Weights   01/03/14 0811  Weight: 248 lb 3.2 oz (112.583 kg)    GENERAL:alert, no distress and comfortable. He is morbidly obese SKIN: skin color, texture, turgor are normal, no rashes or significant lesions EYES: normal, Conjunctiva are pink and non-injected, sclera clear OROPHARYNX:no exudate, no erythema and lips, buccal mucosa, and tongue normal  NECK: supple, thyroid normal size, non-tender, without nodularity LYMPH:  no palpable lymphadenopathy in the cervical, axillary or inguinal LUNGS: clear to auscultation and percussion with normal breathing effort HEART: regular rate & rhythm and no murmurs and no lower extremity edema ABDOMEN:abdomen soft, non-tender and normal bowel sounds. No palpable splenomegaly Musculoskeletal:no cyanosis of digits and no clubbing  NEURO: alert & oriented x 3 with fluent speech, no focal motor/sensory deficits  LABORATORY DATA:  I have reviewed the data as listed No results found for this  or any previous visit (from the past 48 hour(s)).  Lab Results  Component Value Date   WBC 9.4 11/12/2012   HGB 18.0* 11/12/2012   HCT 51.9* 11/12/2012   MCV 93.0 11/12/2012   PLT 202 11/12/2012    ASSESSMENT & PLAN:  #1 secondary  erythrocytosis #2 tobacco abuse #3 obesity #4 mild obstructive sleep apnea, untreated I discussed with the patient the cause of his erythrocytosis is likely benign, secondary to all the above. I am concerned about his risk of thrombosis. I recommend he continue on aspirin. I recommend phlebotomy session. The risks, benefits and side effects of phlebotomy would discuss with the patient and he agreed to proceed. However, due to his time schedule, he would not be able to have it done until end of next week. I will proceed with ordering one unit of blood to be removed and have his CBC, ferritin and erythropoietin level drawn after the phlebotomy session to serve as a new baseline. I plan to see him on a yearly basis. He has an appointment to see his PCP in about 3-4 months. At that time I recommend he has his CBC rechecked and if his hemoglobin is above 16, the patient will call me and we will order another phlebotomy session. I spent some time educating the patient the importance of nicotine cessation and the patient wants to try to quit smoking himself All questions were answered. The patient knows to call the clinic with any problems, questions or concerns. No barriers to learning was detected.  I spent 25 minutes counseling the patient face to face. The total time spent in the appointment was 40 minutes and more than 50% was on counseling.     [facility], [place], MD 01/03/2014 8:48 AM

## 2014-01-03 NOTE — Telephone Encounter (Signed)
gv and printed appt sched and avs forpt for Feb 2015 and 2016

## 2014-01-13 ENCOUNTER — Other Ambulatory Visit (HOSPITAL_BASED_OUTPATIENT_CLINIC_OR_DEPARTMENT_OTHER): Payer: 59

## 2014-01-13 ENCOUNTER — Ambulatory Visit (HOSPITAL_BASED_OUTPATIENT_CLINIC_OR_DEPARTMENT_OTHER): Payer: 59

## 2014-01-13 DIAGNOSIS — D751 Secondary polycythemia: Secondary | ICD-10-CM

## 2014-01-13 DIAGNOSIS — F17200 Nicotine dependence, unspecified, uncomplicated: Secondary | ICD-10-CM

## 2014-01-13 LAB — CBC & DIFF AND RETIC
BASO%: 0.6 % (ref 0.0–2.0)
Basophils Absolute: 0.1 10*3/uL (ref 0.0–0.1)
EOS ABS: 0.2 10*3/uL (ref 0.0–0.5)
EOS%: 2.9 % (ref 0.0–7.0)
HCT: 51 % — ABNORMAL HIGH (ref 38.4–49.9)
HGB: 17.3 g/dL — ABNORMAL HIGH (ref 13.0–17.1)
Immature Retic Fract: 2.2 % — ABNORMAL LOW (ref 3.00–10.60)
LYMPH%: 39.5 % (ref 14.0–49.0)
MCH: 31.3 pg (ref 27.2–33.4)
MCHC: 33.9 g/dL (ref 32.0–36.0)
MCV: 92.4 fL (ref 79.3–98.0)
MONO#: 0.8 10*3/uL (ref 0.1–0.9)
MONO%: 10.1 % (ref 0.0–14.0)
NEUT%: 46.9 % (ref 39.0–75.0)
NEUTROS ABS: 3.9 10*3/uL (ref 1.5–6.5)
PLATELETS: 203 10*3/uL (ref 140–400)
RBC: 5.52 10*6/uL (ref 4.20–5.82)
RDW: 13.2 % (ref 11.0–14.6)
RETIC %: 1.17 % (ref 0.80–1.80)
Retic Ct Abs: 64.58 10*3/uL (ref 34.80–93.90)
WBC: 8.3 10*3/uL (ref 4.0–10.3)
lymph#: 3.3 10*3/uL (ref 0.9–3.3)

## 2014-01-13 LAB — FERRITIN CHCC: Ferritin: 143 ng/ml (ref 22–316)

## 2014-01-13 NOTE — Patient Instructions (Signed)

## 2014-01-13 NOTE — Progress Notes (Signed)
Pt provided snack and drink prior to start of phlebotomy.  Performed via right antecubital.  500cc removed, pt tolerated well. Pt observed for 30 minutes post procedure.

## 2014-01-16 LAB — ERYTHROPOIETIN: ERYTHROPOIETIN: 6 m[IU]/mL (ref 2.6–18.5)

## 2014-02-09 ENCOUNTER — Ambulatory Visit (INDEPENDENT_AMBULATORY_CARE_PROVIDER_SITE_OTHER): Payer: 59 | Admitting: Cardiology

## 2014-02-09 ENCOUNTER — Encounter: Payer: Self-pay | Admitting: Cardiology

## 2014-02-09 VITALS — BP 142/88 | HR 74 | Ht 73.0 in | Wt 241.0 lb

## 2014-02-09 DIAGNOSIS — F17200 Nicotine dependence, unspecified, uncomplicated: Secondary | ICD-10-CM

## 2014-02-09 DIAGNOSIS — Z8774 Personal history of (corrected) congenital malformations of heart and circulatory system: Secondary | ICD-10-CM

## 2014-02-09 DIAGNOSIS — I2510 Atherosclerotic heart disease of native coronary artery without angina pectoris: Secondary | ICD-10-CM

## 2014-02-09 DIAGNOSIS — Z9889 Other specified postprocedural states: Secondary | ICD-10-CM

## 2014-02-09 DIAGNOSIS — Z720 Tobacco use: Secondary | ICD-10-CM

## 2014-02-09 HISTORY — DX: Personal history of (corrected) congenital malformations of heart and circulatory system: Z87.74

## 2014-02-09 NOTE — Progress Notes (Signed)
Chris Long Date of Birth: [birth year] Medical Record #[id number]  History of Present Illness: Mr. Chris Long is seen today for a follow up visit.  He has known mild CAD per cath back in 2010. Last stress echo in 2006. Has had remote surgical ASD repair in 1972. He has ongoing tobacco abuse and HLD. He continues to do well from a cardiac standpoint. He denies any chest pain or SOB. He notes infrequent flutters in his chest that only last a few seconds. He is interested in smoking cessation. He states nicotine products have not helped in the past. Chantix helped before but caused severe nightmares.  Current Outpatient Prescriptions on File Prior to Visit  Medication Sig Dispense Refill  . aspirin 325 MG tablet Take 325 mg by mouth daily.      . fish oil-omega-3 fatty acids 1000 MG capsule Take 2 g by mouth daily.      Marland Kitchen levothyroxine (SYNTHROID, LEVOTHROID) 88 MCG tablet Take 88 mcg by mouth daily.      . metoprolol (LOPRESSOR) 50 MG tablet Take 0.5 tablets (25 mg total) by mouth 2 (two) times daily.  90 tablet  3  . Multiple Vitamin (MULTIVITAMIN) capsule Take 1 capsule by mouth daily.      . rosuvastatin (CRESTOR) 10 MG tablet Take 20 mg by mouth daily.        No current facility-administered medications on file prior to visit.    No Known Allergies  Past Medical History  Diagnosis Date  . Coronary artery disease     Mild CAD per cath in 2010  . Hyperlipidemia   . Hypothyroidism   . Obesity   . Tobacco abuse   . ASD (atrial septal defect)     with repair in 1972  . Polycythemia   . Prediabetes   . Status post patch closure of ASD 02/09/2014    Past Surgical History  Procedure Laterality Date  . Cardiac catheterization  08/28/2009    EF 60%; Mild CAD with normal LV function  . Asd repair  [id number]  . Stress echo test  2006    NO ISCHEMIA, NORMAL  . Tonsillectomy    . Colonoscopy  2011    outlaw; 5 polyps     History  Smoking status  . Current Every Day Smoker --  0.50 packs/day for 30 years  . Types: Cigarettes  Smokeless tobacco  . Never Used    History  Alcohol Use No    Family History  Problem Relation Age of Onset  . Heart attack Mother     X2  . Heart failure Mother   . Hypertension Father   . COPD Father   . Cancer Father     bladder  . Cancer Paternal Uncle     GI cancer    Review of Systems: The review of systems is per the HPI.  All other systems were reviewed and are negative.  Physical Exam: BP 142/88  Pulse 74  Ht 6\' 1"  (1.854 m)  Wt 241 lb (109.317 kg)  BMI 31.80 kg/m2 Patient is very pleasant and in no acute distress. Skin is warm and dry. Color is normal.  HEENT is unremarkable. Normocephalic/atraumatic. PERRL. Sclera are nonicteric. Neck is supple. No masses. No JVD. Lungs are clear. Cardiac exam shows a regular rate and rhythm. Abdomen is soft. Extremities are without edema. Gait and ROM are intact. No gross neurologic deficits noted.   LABORATORY DATA: Ecg: NSR, normal Ecg. Rate 74 bpm  Assessment / Plan: 1. S/p ASD repair. Normal exam and Ecg.  2. Palpitations. Mild. With history of ASD repair he is at higher risk of atrial arrhythmias. Will continue metoprolol.   3. Mild CAD  4. Tobacco abuse. Consider trial of Wellbutrin. He will discuss with Dr. Virgina Jock.  5. Hyperlipidemia- on Crestor and fish oil.

## 2014-02-09 NOTE — Patient Instructions (Signed)
Continue your current therapy.  I will see you in one year.  Consider Wellbutrin to help you quit smoking -- discuss with Dr. Virgina Jock.

## 2014-07-20 ENCOUNTER — Other Ambulatory Visit: Payer: Self-pay | Admitting: Hematology and Oncology

## 2014-07-20 ENCOUNTER — Telehealth: Payer: Self-pay | Admitting: *Deleted

## 2014-07-20 NOTE — Telephone Encounter (Signed)
I ordered phlebotomy. Can you ask if he can get CBC in 1 month with PCP of does he wants to come here for CBC and then phlebotomy

## 2014-07-20 NOTE — Telephone Encounter (Signed)
Received Labs from [facility]..   Dr. Alvy Bimler reviewed and instructs for pt to either donate blood or come in to clinic for Phlebotomy due to Hgb greater than 17.0.   Hgb was 17.7 and Hct 50.7 on 07/19/14.  Spoke w/ pt and he understands need for phlebotomy and prefers to come into clinic.  Informed him to expect call from Scheduler to get scheduled w/i one week.  He verbalized understanding.  Order sent to [facility].

## 2014-07-21 ENCOUNTER — Other Ambulatory Visit: Payer: Self-pay | Admitting: *Deleted

## 2014-07-21 ENCOUNTER — Telehealth: Payer: Self-pay | Admitting: Hematology and Oncology

## 2014-07-21 NOTE — Telephone Encounter (Signed)
lvm for pt regarding to Sept appt.... °

## 2014-07-21 NOTE — Telephone Encounter (Signed)
Left VM for pt Dr. Alvy Bimler recommends repeat CBC in one month. Please let us know if he wants to have it checked at PCP or done here?

## 2014-07-24 ENCOUNTER — Telehealth: Payer: Self-pay | Admitting: Hematology and Oncology

## 2014-07-24 NOTE — Telephone Encounter (Signed)
lvm for pt regarding to Sept and Feb 2016 appt....mailed pt appt sched and letter

## 2014-08-04 ENCOUNTER — Ambulatory Visit (HOSPITAL_BASED_OUTPATIENT_CLINIC_OR_DEPARTMENT_OTHER): Payer: 59

## 2014-08-04 VITALS — BP 106/74 | HR 75 | Temp 98.0°F | Resp 18

## 2014-08-04 DIAGNOSIS — Z23 Encounter for immunization: Secondary | ICD-10-CM

## 2014-08-04 DIAGNOSIS — D751 Secondary polycythemia: Secondary | ICD-10-CM

## 2014-08-04 MED ORDER — INFLUENZA VAC SPLIT QUAD 0.5 ML IM SUSY
0.5000 mL | PREFILLED_SYRINGE | Freq: Once | INTRAMUSCULAR | Status: AC
  Administered 2014-08-04: 0.5 mL via INTRAMUSCULAR
  Filled 2014-08-04: qty 0.5

## 2014-08-04 NOTE — Progress Notes (Signed)
500 grams removed from left AC. Patient tolerated well. Drinks provided before and after phlebotomy

## 2014-08-04 NOTE — Progress Notes (Signed)
Pt monitored for 30 minutes post phlebotomy. States he had breakfast prior to and was going to get some more food when leaving. Pt refused snacks but had drink during and after phlebotomy procedure. Pt denied any symptoms of dizziness or light-headedness upon standing. Pt ambulatory with without distress upon discharge.

## 2014-08-04 NOTE — Patient Instructions (Signed)

## 2014-09-13 ENCOUNTER — Emergency Department (HOSPITAL_COMMUNITY): Payer: 59

## 2014-09-13 ENCOUNTER — Emergency Department (HOSPITAL_COMMUNITY)
Admission: EM | Admit: 2014-09-13 | Discharge: 2014-09-13 | Disposition: A | Discharge status: Home / Self Care | Payer: 59 | Attending: Emergency Medicine | Admitting: Emergency Medicine

## 2014-09-13 ENCOUNTER — Encounter (HOSPITAL_COMMUNITY): Payer: Self-pay | Admitting: Emergency Medicine

## 2014-09-13 DIAGNOSIS — I2510 Atherosclerotic heart disease of native coronary artery without angina pectoris: Secondary | ICD-10-CM | POA: Insufficient documentation

## 2014-09-13 DIAGNOSIS — R002 Palpitations: Secondary | ICD-10-CM

## 2014-09-13 DIAGNOSIS — E039 Hypothyroidism, unspecified: Secondary | ICD-10-CM | POA: Diagnosis not present

## 2014-09-13 DIAGNOSIS — R079 Chest pain, unspecified: Secondary | ICD-10-CM | POA: Diagnosis present

## 2014-09-13 DIAGNOSIS — Z862 Personal history of diseases of the blood and blood-forming organs and certain disorders involving the immune mechanism: Secondary | ICD-10-CM | POA: Diagnosis not present

## 2014-09-13 DIAGNOSIS — E669 Obesity, unspecified: Secondary | ICD-10-CM | POA: Insufficient documentation

## 2014-09-13 DIAGNOSIS — I10 Essential (primary) hypertension: Secondary | ICD-10-CM | POA: Insufficient documentation

## 2014-09-13 DIAGNOSIS — E785 Hyperlipidemia, unspecified: Secondary | ICD-10-CM | POA: Diagnosis not present

## 2014-09-13 DIAGNOSIS — Z720 Tobacco use: Secondary | ICD-10-CM | POA: Insufficient documentation

## 2014-09-13 DIAGNOSIS — Z79899 Other long term (current) drug therapy: Secondary | ICD-10-CM | POA: Diagnosis not present

## 2014-09-13 DIAGNOSIS — I491 Atrial premature depolarization: Secondary | ICD-10-CM

## 2014-09-13 DIAGNOSIS — Z9889 Other specified postprocedural states: Secondary | ICD-10-CM | POA: Diagnosis not present

## 2014-09-13 HISTORY — DX: Essential (primary) hypertension: I10

## 2014-09-13 LAB — COMPREHENSIVE METABOLIC PANEL
ALT: 32 U/L (ref 0–53)
AST: 21 U/L (ref 0–37)
Albumin: 3.9 g/dL (ref 3.5–5.2)
Alkaline Phosphatase: 60 U/L (ref 39–117)
Anion gap: 14 (ref 5–15)
BUN: 15 mg/dL (ref 6–23)
CALCIUM: 9.7 mg/dL (ref 8.4–10.5)
CO2: 23 mEq/L (ref 19–32)
Chloride: 102 mEq/L (ref 96–112)
Creatinine, Ser: 0.9 mg/dL (ref 0.50–1.35)
GFR calc Af Amer: >90 mL/min (ref >90)
GFR calc non Af Amer: >90 mL/min (ref >90)
Glucose, Bld: 114 mg/dL — ABNORMAL HIGH (ref 70–99)
Potassium: 4.7 mEq/L (ref 3.7–5.3)
Sodium: 139 mEq/L (ref 137–147)
TOTAL PROTEIN: 7.2 g/dL (ref 6.0–8.3)
Total Bilirubin: 0.3 mg/dL (ref 0.3–1.2)

## 2014-09-13 LAB — CBC WITH DIFFERENTIAL/PLATELET
Basophils Absolute: 0.1 10*3/uL (ref 0.0–0.1)
Basophils Relative: 1 % (ref 0–1)
EOS PCT: 2 % (ref 0–5)
Eosinophils Absolute: 0.2 10*3/uL (ref 0.0–0.7)
HCT: 49.2 % (ref 39.0–52.0)
Hemoglobin: 16.9 g/dL (ref 13.0–17.0)
LYMPHS ABS: 2.5 10*3/uL (ref 0.7–4.0)
Lymphocytes Relative: 28 % (ref 12–46)
MCH: 31.5 pg (ref 26.0–34.0)
MCHC: 34.3 g/dL (ref 30.0–36.0)
MCV: 91.8 fL (ref 78.0–100.0)
Monocytes Absolute: 0.9 10*3/uL (ref 0.1–1.0)
Monocytes Relative: 11 % (ref 3–12)
Neutro Abs: 5.1 10*3/uL (ref 1.7–7.7)
Neutrophils Relative %: 58 % (ref 43–77)
Platelets: 212 10*3/uL (ref 150–400)
RBC: 5.36 MIL/uL (ref 4.22–5.81)
RDW: 13 % (ref 11.5–15.5)
WBC: 8.7 10*3/uL (ref 4.0–10.5)

## 2014-09-13 LAB — PROTIME-INR
INR: 0.98 (ref 0.00–1.49)
Prothrombin Time: 13.1 seconds (ref 11.6–15.2)

## 2014-09-13 LAB — I-STAT TROPONIN, ED: TROPONIN I, POC: 0 ng/mL (ref 0.00–0.08)

## 2014-09-13 LAB — MAGNESIUM: Magnesium: 2.1 mg/dL (ref 1.5–2.5)

## 2014-09-13 LAB — PHOSPHORUS: PHOSPHORUS: 4 mg/dL (ref 2.3–4.6)

## 2014-09-13 LAB — PRO B NATRIURETIC PEPTIDE: PRO B NATRI PEPTIDE: 58.8 pg/mL (ref 0–125)

## 2014-09-13 LAB — D-DIMER, QUANTITATIVE: D-Dimer, Quant: <0.27 ug/mL-FEU (ref 0.00–0.48)

## 2014-09-13 LAB — APTT: APTT: 28 s (ref 24–37)

## 2014-09-13 NOTE — Discharge Instructions (Signed)
Premature Beats A premature beat is an extra heartbeat that happens earlier than normal. Premature beats are called premature atrial contractions (PACs) or premature ventricular contractions (PVCs) depending on the area of the heart where they start. CAUSES  Premature beats may be brought on by a variety of factors including:  Emotional stress.  Lack of sleep.  Caffeine.  Asthma medicines.  Stimulants.  Herbal teas.  Dietary supplements.  Alcohol. In most cases, premature beats are not dangerous and are not a sign of serious heart disease. Most patients evaluated for premature beats have completely normal heart function. Rarely, premature beats may be a sign of more significant heart problems or medical illness. SYMPTOMS  Premature beats may cause palpitations. This means you feel like your heart is skipping a beat or beating harder than usual. Sometimes, slight chest pain occurs with premature beats, lasting only a few seconds. This pain has been described as a "flopping" feeling inside the chest. In many cases, premature beats do not cause any symptoms and they are only detected when an electrocardiography test (EKG) or heart monitoring is performed. DIAGNOSIS  Your caregiver may run some tests to evaluate your heart such as an EKG or echocardiography. You may need to wear a portable heart monitor for several days to record the electrical activity of your heart. Blood testing may also be performed to check your electrolytes and thyroid function. TREATMENT  Premature beats usually go away with rest. If the problem continues, your caregiver will determine a treatment plan for you.  HOME CARE INSTRUCTIONS  Get plenty of rest over the next few days until your symptoms improve.  Avoid coffee, tea, alcohol, and soda (pop, cola).  Do not smoke. SEEK MEDICAL CARE IF:  Your symptoms continue after 1 to 2 days of rest.  You have new symptoms, such as chest pain or trouble  breathing. SEEK IMMEDIATE MEDICAL CARE IF:  You have severe chest pain or abdominal pain.  You have pain that radiates into the neck, arm, or jaw.  You faint or have extreme weakness.  You have shortness of breath.  Your heartbeat races for more than 5 seconds. MAKE SURE YOU:  Understand these instructions.  Will watch your condition.  Will get help right away if you are not doing well or get worse. Document Released: 12/18/2004 Document Revised: 02/02/2012 Document Reviewed: 07/14/2011 Exit[employer] Patient Information 2015 [employer]. This information is not intended to replace advice given to you by your health care provider. Make sure you discuss any questions you have with your health care provider.  

## 2014-09-13 NOTE — ED Notes (Signed)
Pt c/o central chest discomfort, SOB, dizziness, nausea, and back pain x 3 hours.  Pain score 2/10.  Pt reports previously having this pain, but was not given a diagnosis.  Hx of smoking, HTN, and high cholesterol.

## 2014-09-13 NOTE — ED Provider Notes (Signed)
CSN: [id number]     Arrival date & time 09/13/14  1210 History   First MD Initiated Contact with Patient 09/13/14 1235     Chief Complaint  Patient presents with  . Chest Pain     (Consider location/radiation/quality/duration/timing/severity/associated sxs/prior Treatment) HPI Patient experienced palpitations this morning. He had a sensation of pressure or tightness in his chest and intermittent thumping. In association with this he felt slightly nauseated mildly short of breath and dizzy. No radiation of pain. Patient denies history of similar symptoms. It spontaneously resolved. Patient has a history of cardiac catheterization 5 years ago without significant abnormality. He reports that as a 53 year old he had an ASO repair. No subsequent complications. No medication changes.  Past Medical History  Diagnosis Date  . Coronary artery disease     Mild CAD per cath in 2010  . Hyperlipidemia   . Hypothyroidism   . Obesity   . Tobacco abuse   . ASD (atrial septal defect)     with repair in 1972  . Polycythemia   . Prediabetes   . Status post patch closure of ASD 02/09/2014  . Hypertension    Past Surgical History  Procedure Laterality Date  . Cardiac catheterization  08/28/2009    EF 60%; Mild CAD with normal LV function  . Asd repair  [id number]  . Stress echo test  2006    NO ISCHEMIA, NORMAL  . Tonsillectomy    . Colonoscopy  2011    outlaw; 5 polyps    Family History  Problem Relation Age of Onset  . Heart attack Mother     X2  . Heart failure Mother   . Hypertension Father   . COPD Father   . Cancer Father     bladder  . Cancer Paternal Uncle     GI cancer   History  Substance Use Topics  . Smoking status: Current Every Day Smoker -- 0.50 packs/day for 30 years    Types: Cigarettes  . Smokeless tobacco: Never Used  . Alcohol Use: No    Review of Systems  10 Systems reviewed and are negative for acute change except as noted in the HPI.   Allergies  Review of  patient's allergies indicates no known allergies.  Home Medications   Prior to Admission medications   Medication Sig Start Date End Date Taking? Authorizing Provider  aspirin 325 MG tablet Take 325 mg by mouth daily.   Yes Historical Provider, MD  fish oil-omega-3 fatty acids 1000 MG capsule Take 2 g by mouth daily.   Yes Historical Provider, MD  levothyroxine (SYNTHROID, LEVOTHROID) 88 MCG tablet Take 88 mcg by mouth daily.   Yes Historical Provider, MD  metoprolol (LOPRESSOR) 50 MG tablet Take 0.5 tablets (25 mg total) by mouth 2 (two) times daily. 02/16/12  Yes Burtis Junes, NP  Multiple Vitamin (MULTIVITAMIN) capsule Take 1 capsule by mouth daily.   Yes Historical Provider, MD  rosuvastatin (CRESTOR) 10 MG tablet Take 20 mg by mouth daily.    Yes Historical Provider, MD   BP 128/83  Pulse 70  Temp(Src) 98.1 F (36.7 C) (Oral)  Resp 15  SpO2 95% Physical Exam  Constitutional: He is oriented to person, place, and time. He appears well-developed and well-nourished.  HENT:  Head: Normocephalic and atraumatic.  Eyes: EOM are normal. Pupils are equal, round, and reactive to light.  Neck: Neck supple.  Cardiovascular: Normal rate, regular rhythm, normal heart sounds and intact distal pulses.  Pulmonary/Chest: Effort normal and breath sounds normal.  Abdominal: Soft. Bowel sounds are normal. He exhibits no distension. There is no tenderness.  Musculoskeletal: Normal range of motion. He exhibits no edema.  Neurological: He is alert and oriented to person, place, and time. He has normal strength. Coordination normal. GCS eye subscore is 4. GCS verbal subscore is 5. GCS motor subscore is 6.  Skin: Skin is warm, dry and intact.  Psychiatric: He has a normal mood and affect.    ED Course  Procedures (including critical care time) Labs Review Labs Reviewed  COMPREHENSIVE METABOLIC PANEL - Abnormal; Notable for the following:    Glucose, Bld 114 (*)    All other components within  normal limits  CBC WITH DIFFERENTIAL  PRO B NATRIURETIC PEPTIDE  APTT  PROTIME-INR  MAGNESIUM  PHOSPHORUS  D-DIMER, QUANTITATIVE  I-STAT TROPOININ, ED    Imaging Review Dg Chest 2 View  09/13/2014   CLINICAL DATA:  Mid thoracic non radiating chest pain; history of hypertension and high cholesterol and mild coronary artery disease on previous catheterization; chronic tobacco use  EXAM: CHEST  2 VIEW  COMPARISON:  PA and lateral chest X ray of August 27, 2009.  FINDINGS: The lungs are adequately inflated. There is no focal infiltrate. The interstitial markings are chronically increased but stable. The heart and pulmonary vascularity are normal. The mediastinum is normal in width. There is no pleural effusion or pneumothorax. There is degenerative disc change at multiple thoracic levels.  IMPRESSION: There is no evidence of CHF nor of pneumonia. Mild prominence of the pulmonary interstitial markings likely reflects the patient's chronic smoking history.   Electronically Signed   By: David  Martinique   On: 09/13/2014 14:15     EKG Interpretation   Date/Time:  Wednesday September 13 2014 12:16:23 EDT Ventricular Rate:  85 PR Interval:  176 QRS Duration: 99 QT Interval:  382 QTC Calculation: 143 R Axis:   34 Text Interpretation:  Sinus rhythm Atrial premature complexes Otherwise  normal ECG Confirmed by KOHUT  MD, STEPHEN ([id number]) on 09/13/2014 12:25:14  PM      MDM   Final diagnoses:  Heart palpitations  PAC (premature atrial contraction)   The patient has been perceiving palpitations. EKG confirms PACs. These are occasional. Patient also reported some chest discomfort in terms of a pressure-like sensation while this was occurring. This was self-limited and spontaneously resolved. Patient does have a history of cardiac catheterization without significant any coronary artery disease. EKG is nonischemic and cardiac enzymes are negative. At this time for patient safety followup with his  family physician for repeat assessment. He does take daily metoprolol which 11 continue and provide precautions for any caffeine and stimulant use.   Charlesetta Shanks, MD 09/13/14 [phone]

## 2014-09-13 NOTE — ED Notes (Signed)
MD at bedside. 

## 2014-10-04 ENCOUNTER — Telehealth: Payer: Self-pay | Admitting: Cardiology

## 2014-10-04 NOTE — Telephone Encounter (Signed)
Chris Long is calling because he had to go to E/R and he would rather see Dr.Jordan versus seeing the PA . Please Call    Thanks

## 2014-10-06 NOTE — Telephone Encounter (Signed)
Returned call to patient follow up ER visit scheduled with Truitt Merle NP 10/31/14 at 10:00 am at [facility] office.

## 2014-10-31 ENCOUNTER — Ambulatory Visit (INDEPENDENT_AMBULATORY_CARE_PROVIDER_SITE_OTHER): Payer: 59 | Admitting: Nurse Practitioner

## 2014-10-31 ENCOUNTER — Encounter: Payer: Self-pay | Admitting: Nurse Practitioner

## 2014-10-31 VITALS — BP 126/92 | HR 91 | Ht 73.0 in | Wt 244.4 lb

## 2014-10-31 DIAGNOSIS — R002 Palpitations: Secondary | ICD-10-CM

## 2014-10-31 DIAGNOSIS — I2510 Atherosclerotic heart disease of native coronary artery without angina pectoris: Secondary | ICD-10-CM

## 2014-10-31 DIAGNOSIS — E785 Hyperlipidemia, unspecified: Secondary | ICD-10-CM

## 2014-10-31 DIAGNOSIS — Q211 Atrial septal defect, unspecified: Secondary | ICD-10-CM

## 2014-10-31 DIAGNOSIS — Z720 Tobacco use: Secondary | ICD-10-CM

## 2014-10-31 NOTE — Patient Instructions (Addendum)
Stay on your current medicines  We will get an echocardiogram   See Dr. Martinique in 6 months  Try to limit caffeine/stimulant  Ok to take an extra dose of your metoprolol for palpitations if needed.  Call the [place] office at [phone] if you have any questions, problems or concerns.

## 2014-10-31 NOTE — Progress Notes (Signed)
Chris Long Date of Birth: 1961-07-02 Medical Record #[id number]  History of Present Illness: Chris Long is seen today for a follow up visit.  This is a 7 month check. He is seen for Chris Long.  He has known mild CAD per cath back in 2010. Last stress echo in 2006. Has had remote ASD repair in 1972. He has ongoing tobacco abuse and HLD. Other issues include prediabetes, polycythemia, HTN, obesity and hypothyroidism.   No recent studies noted in EPIC.   Last seen by Chris Long in March of 2015 - was doing ok. Still smoking. Was going to discuss with his PCP about Wellbutrin.   In the ER in October with palpitations/chest pain - PACs on EKG noted. Negative enzymes. Continued with beta blocker and advised to cut out stimulants/caffeine.   Comes in today. Here alone. He is doing ok. Has had some rare palpitations - nothing like what took him to the ER in October. He was a little lightheaded with the spells in October. No chest pain. BP fair. Has had phlebotomy x 2 recently. Still smoking. 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 Labs are followed by Chris Long.  Current Outpatient Prescriptions  Medication Sig Dispense Refill  . aspirin 325 MG tablet Take 325 mg by mouth daily.    . fish oil-omega-3 fatty acids 1000 MG capsule Take 2 g by mouth daily.    Marland Kitchen levothyroxine (SYNTHROID, LEVOTHROID) 88 MCG tablet Take 88 mcg by mouth daily.    . metoprolol (LOPRESSOR) 50 MG tablet Take 0.5 tablets (25 mg total) by mouth 2 (two) times daily. 90 tablet 3  . Multiple Vitamin (MULTIVITAMIN) capsule Take 1 capsule by mouth daily.    . rosuvastatin (CRESTOR) 10 MG tablet Take 20 mg by mouth daily.      No current facility-administered medications for this visit.    No Known Allergies  Past Medical History  Diagnosis Date  . Coronary artery disease     Mild CAD per cath in 2010  . Hyperlipidemia   . Hypothyroidism   . Obesity   . Tobacco abuse   . ASD (atrial septal  defect)     with repair in 1972  . Polycythemia   . Prediabetes   . Status post patch closure of ASD 02/09/2014  . Hypertension     Past Surgical History  Procedure Laterality Date  . Cardiac catheterization  08/28/2009    EF 60%; Mild CAD with normal LV function  . Asd repair  [id number]  . Stress echo test  2006    NO ISCHEMIA, NORMAL  . Tonsillectomy    . Colonoscopy  2011    outlaw; 5 polyps     History  Smoking status  . Current Every Day Smoker -- 0.50 packs/day for 30 years  . Types: Cigarettes  Smokeless tobacco  . Never Used    History  Alcohol Use No    Family History  Problem Relation Age of Onset  . Heart attack Mother     X2  . Heart failure Mother   . Hypertension Father   . COPD Father   . Cancer Father     bladder  . Cancer Paternal Uncle     GI cancer    Review of Systems: The review of systems is per the HPI.  All other systems were reviewed and are negative.  Physical Exam: BP 126/92 mmHg  Pulse 91  Ht 6\' 1"  (1.854 m)  Wt  244 lb 6.4 oz (110.859 kg)  BMI 32.25 kg/m2  SpO2 96% Patient is very pleasant and in no acute distress. Skin is warm and dry. Color is normal.  HEENT is unremarkable. Normocephalic/atraumatic. PERRL. Sclera are nonicteric. Neck is supple. No masses. No JVD. Lungs are clear. Cardiac exam shows a regular rate and rhythm. Abdomen is soft. Extremities are without edema. Gait and ROM are intact. No gross neurologic deficits noted.  Wt Readings from Last 3 Encounters:  10/31/14 244 lb 6.4 oz (110.859 kg)  02/09/14 241 lb (109.317 kg)  01/03/14 248 lb 3.2 oz (112.583 kg)    LABORATORY DATA/PROCEDURES:  Lab Results  Component Value Date   WBC 8.7 09/13/2014   HGB 16.9 09/13/2014   HCT 49.2 09/13/2014   PLT 212 09/13/2014   GLUCOSE 114* 09/13/2014   CHOL  08/28/2009    196        ATP III CLASSIFICATION:  <200     mg/dL   Desirable  200-239  mg/dL   Borderline High  >=240    mg/dL   High          TRIG 235* 08/28/2009     HDL 33* 08/28/2009   LDLCALC * 08/28/2009    116        Total Cholesterol/HDL:CHD Risk Coronary Heart Disease Risk Table                     Men   Women  1/2 Average Risk   3.4   3.3  Average Risk       5.0   4.4  2 X Average Risk   9.6   7.1  3 X Average Risk  23.4   11.0        Use the calculated Patient Ratio above and the CHD Risk Table to determine the patient's CHD Risk.        ATP III CLASSIFICATION (LDL):  <100     mg/dL   Optimal  100-129  mg/dL   Near or Above                    Optimal  130-159  mg/dL   Borderline  160-189  mg/dL   High  >190     mg/dL   Very High   ALT 32 09/13/2014   AST 21 09/13/2014   NA 139 09/13/2014   K 4.7 09/13/2014   CL 102 09/13/2014   CREATININE 0.90 09/13/2014   BUN 15 09/13/2014   CO2 23 09/13/2014   TSH 5.085 Test methodology is 3rd generation TSH 08/28/2009   INR 0.98 09/13/2014    BNP (last 3 results)  Recent Labs  09/13/14 1315  PROBNP 58.8     Assessment / Plan: 1. CAD - mild per cath from 2010 - no active symptoms - continue with CV risk factor management.  2. Prior ASD repair - will get his echo updated.   3. Palpitations - PACs noted on prior EKG - will get his echo updated. He may take extra dose of his metoprolol prn.   4. Ongoing tobacco abuse -  Counseled at length -  Has not tolerated Chantix due to nightmares.   5. HLD  Will get his echo updated. Encouraged to stop smoking. Limit caffeine/stimulant use. See back in 6 months. I will be happy to see back as well. Labs by Chris Long.   Patient is agreeable to this plan and will call if any problems develop  in the interim.   Chris Junes, RN, [facility] [address] [place], [place]  [id number] [phone]

## 2014-11-13 ENCOUNTER — Ambulatory Visit (HOSPITAL_COMMUNITY): Payer: 59 | Attending: Cardiovascular Disease | Admitting: Cardiology

## 2014-11-13 DIAGNOSIS — E669 Obesity, unspecified: Secondary | ICD-10-CM | POA: Diagnosis not present

## 2014-11-13 DIAGNOSIS — Q211 Atrial septal defect, unspecified: Secondary | ICD-10-CM

## 2014-11-13 DIAGNOSIS — F17210 Nicotine dependence, cigarettes, uncomplicated: Secondary | ICD-10-CM | POA: Diagnosis not present

## 2014-11-13 DIAGNOSIS — I10 Essential (primary) hypertension: Secondary | ICD-10-CM | POA: Diagnosis not present

## 2014-11-13 DIAGNOSIS — I2510 Atherosclerotic heart disease of native coronary artery without angina pectoris: Secondary | ICD-10-CM | POA: Insufficient documentation

## 2014-11-13 DIAGNOSIS — R002 Palpitations: Secondary | ICD-10-CM | POA: Diagnosis not present

## 2014-11-13 DIAGNOSIS — Z720 Tobacco use: Secondary | ICD-10-CM

## 2014-11-13 DIAGNOSIS — E785 Hyperlipidemia, unspecified: Secondary | ICD-10-CM | POA: Diagnosis not present

## 2014-11-13 NOTE — Progress Notes (Signed)
Echo performed. 

## 2014-12-04 ENCOUNTER — Telehealth: Payer: Self-pay | Admitting: Hematology and Oncology

## 2014-12-04 NOTE — Telephone Encounter (Signed)
s.w pt and advised on 2.12.16 appt moved to 2.19 due to MD on pal..Marland KitchenMarland KitchenMarland Kitchenpt ok and aware

## 2015-01-05 ENCOUNTER — Ambulatory Visit: Payer: 59 | Admitting: Hematology and Oncology

## 2015-01-12 ENCOUNTER — Ambulatory Visit (HOSPITAL_BASED_OUTPATIENT_CLINIC_OR_DEPARTMENT_OTHER): Payer: 59 | Admitting: Hematology and Oncology

## 2015-01-12 ENCOUNTER — Ambulatory Visit (HOSPITAL_BASED_OUTPATIENT_CLINIC_OR_DEPARTMENT_OTHER): Payer: 59

## 2015-01-12 ENCOUNTER — Encounter: Payer: Self-pay | Admitting: Hematology and Oncology

## 2015-01-12 ENCOUNTER — Telehealth: Payer: Self-pay | Admitting: Hematology and Oncology

## 2015-01-12 ENCOUNTER — Other Ambulatory Visit: Payer: Self-pay | Admitting: Hematology and Oncology

## 2015-01-12 VITALS — BP 142/84 | HR 76 | Temp 98.3°F | Resp 18 | Ht 73.0 in | Wt 244.6 lb

## 2015-01-12 DIAGNOSIS — D751 Secondary polycythemia: Secondary | ICD-10-CM

## 2015-01-12 DIAGNOSIS — Z720 Tobacco use: Secondary | ICD-10-CM

## 2015-01-12 LAB — CBC WITH DIFFERENTIAL/PLATELET
BASO%: 0.6 % (ref 0.0–2.0)
Basophils Absolute: 0.1 10*3/uL (ref 0.0–0.1)
EOS%: 2.2 % (ref 0.0–7.0)
Eosinophils Absolute: 0.2 10*3/uL (ref 0.0–0.5)
HEMATOCRIT: 51.3 % — AB (ref 38.4–49.9)
HGB: 17.5 g/dL — ABNORMAL HIGH (ref 13.0–17.1)
LYMPH%: 32 % (ref 14.0–49.0)
MCH: 31.9 pg (ref 27.2–33.4)
MCHC: 34.1 g/dL (ref 32.0–36.0)
MCV: 93.6 fL (ref 79.3–98.0)
MONO#: 0.8 10*3/uL (ref 0.1–0.9)
MONO%: 9.8 % (ref 0.0–14.0)
NEUT#: 4.7 10*3/uL (ref 1.5–6.5)
NEUT%: 55.4 % (ref 39.0–75.0)
PLATELETS: 203 10*3/uL (ref 140–400)
RBC: 5.48 10*6/uL (ref 4.20–5.82)
RDW: 13.1 % (ref 11.0–14.6)
WBC: 8.5 10*3/uL (ref 4.0–10.3)
lymph#: 2.7 10*3/uL (ref 0.9–3.3)

## 2015-01-12 NOTE — Telephone Encounter (Signed)
gv and printd appt sched and avs for pt for today...added phlbot per charge nurse

## 2015-01-12 NOTE — Patient Instructions (Signed)

## 2015-01-12 NOTE — Progress Notes (Signed)
Phlebotomy performed via left AC.  500gm removed over approximately 5 minutes without problems.  Drink provided/pt refused snack.  Pt observed for 30 minutes post procedure.  Pt tolerated well/without difficultly.

## 2015-01-13 ENCOUNTER — Encounter: Payer: Self-pay | Admitting: Hematology and Oncology

## 2015-01-13 NOTE — Assessment & Plan Note (Signed)
I spent some time counseling the patient the importance of tobacco cessation. he is currently attempting to quit on his own  I gave him patient education handout and encouraged him to sign up for smoking cessation class.  

## 2015-01-13 NOTE — Assessment & Plan Note (Signed)
The patient has not quit smoking. He has persistent polycythemia related to smoking. Spent a lot of time with him just discussing the importance of nicotine cessation. I recommend 1 unit of phlebotomy He has appointment to see PCP in 2 months with plan to get his blood count rechecked. In the future, once he stops smoking, the secondary erythrocytosis should resolve.

## 2015-01-13 NOTE — Progress Notes (Signed)
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 Peripheral blood for JAK 2 mutation was negative. The patient was advised to stop smoking. He also underwent sleep study and was told he had mild obstructive sleep apnea but declined treatment for now. In 2015, he had multiple phlebotomy sessions INTERVAL HISTORY: Chris Long 54 y.o. male returns for further follow-up. He continues to smoke. He complained of occasional chest palpitation. Denies recent chest pain. No recent diagnosis of blood clot. I have reviewed the past medical history, past surgical history, social history and family history with the patient and they are unchanged from previous note.  ALLERGIES:  has No Known Allergies.  MEDICATIONS:  Current Outpatient Prescriptions  Medication Sig Dispense Refill  . aspirin 325 MG tablet Take 325 mg by mouth daily.    . fish oil-omega-3 fatty acids 1000 MG capsule Take 2 g by mouth daily.    Marland Kitchen levothyroxine (SYNTHROID, LEVOTHROID) 88 MCG tablet Take 88 mcg by mouth daily.    . metoprolol (LOPRESSOR) 50 MG tablet Take 0.5 tablets (25 mg total) by mouth 2 (two) times daily. 90 tablet 3  . Multiple Vitamin (MULTIVITAMIN) capsule Take 1 capsule by mouth daily.    . rosuvastatin (CRESTOR) 10 MG tablet Take 20 mg by mouth daily.      No current facility-administered medications for this visit.     REVIEW OF SYSTEMS:   Constitutional: Denies fevers, chills or night sweats Eyes: Denies blurriness of vision Ears, nose, mouth, throat, and face: Denies mucositis or sore throat Respiratory: Denies cough, dyspnea or wheezes Cardiovascular: Denies palpitation, chest discomfort or lower extremity  swelling Gastrointestinal:  Denies nausea, heartburn or change in bowel habits Skin: Denies abnormal skin rashes Lymphatics: Denies new lymphadenopathy or easy bruising Neurological:Denies numbness, tingling or new weaknesses Behavioral/Psych: Mood is stable, no new changes  All other systems were reviewed with the patient and are negative.  PHYSICAL EXAMINATION: ECOG PERFORMANCE STATUS: 0 - Asymptomatic  Filed Vitals:   01/12/15 0912  BP: 142/84  Pulse: 76  Temp: 98.3 F (36.8 C)  Resp: 18   Filed Weights   01/12/15 0912  Weight: 244 lb 9.6 oz (110.95 kg)    GENERAL:alert, no distress and comfortable SKIN: skin color, texture, turgor are normal, no rashes or significant lesions EYES: normal, Conjunctiva are pink and non-injected, sclera clear OROPHARYNX:no exudate, no erythema and lips, buccal mucosa, and tongue normal  NECK: supple, thyroid normal size, non-tender, without nodularity LYMPH:  no palpable lymphadenopathy in the cervical, axillary or inguinal LUNGS: clear to auscultation and percussion with normal breathing effort HEART: regular rate & rhythm and no murmurs and no lower extremity edema ABDOMEN:abdomen soft, non-tender and normal bowel sounds Musculoskeletal:no cyanosis of digits and no clubbing  NEURO: alert & oriented x 3 with fluent speech, no focal motor/sensory deficits  LABORATORY DATA:  I have reviewed the data as listed No results found for this or any previous visit (from the past 48 hour(s)).  Lab Results  Component Value Date   WBC 8.5 01/12/2015   HGB 17.5* 01/12/2015   HCT 51.3* 01/12/2015   MCV 93.6 01/12/2015   PLT 203 01/12/2015   ASSESSMENT & PLAN:  Polycythemia The patient has not quit smoking. He has persistent polycythemia  related to smoking. Spent a lot of time with him just discussing the importance of nicotine cessation. I recommend 1 unit of phlebotomy He has appointment to see PCP in 2 months with plan to get his blood count  rechecked. In the future, once he stops smoking, the secondary erythrocytosis should resolve.    Tobacco abuse I spent some time counseling the patient the importance of tobacco cessation. he is currently attempting to quit on his own  I gave him patient education handout and encouraged him to sign up for smoking cessation class.     All questions were answered. The patient knows to call the clinic with any problems, questions or concerns. No barriers to learning was detected.  I spent 15 minutes counseling the patient face to face. The total time spent in the appointment was 20 minutes and more than 50% was on counseling.     [facility], Lisa-Marie Rueger, MD 2/20/20168:20 AM

## 2015-08-06 ENCOUNTER — Ambulatory Visit: Payer: 59 | Admitting: Hematology and Oncology

## 2015-08-06 ENCOUNTER — Telehealth: Payer: Self-pay | Admitting: Hematology and Oncology

## 2015-08-06 NOTE — Telephone Encounter (Signed)
s.w. pt and advised on Sept appt pt ok and aware °

## 2015-08-08 ENCOUNTER — Telehealth: Payer: Self-pay | Admitting: *Deleted

## 2015-08-08 ENCOUNTER — Ambulatory Visit: Payer: 59 | Admitting: Hematology and Oncology

## 2015-08-08 ENCOUNTER — Telehealth: Payer: Self-pay | Admitting: Hematology and Oncology

## 2015-08-08 ENCOUNTER — Encounter: Payer: Self-pay | Admitting: Hematology and Oncology

## 2015-08-08 NOTE — Telephone Encounter (Signed)
Spoke with patient re NG/phleb 9/26 @ 3 pm.

## 2015-08-08 NOTE — Telephone Encounter (Signed)
Called pt regarding his missed appt today.  He says he forgot about the appts because he has been so busy at work.  Informed pt I will ask a Scheduler to call him back to get him r/s as soon as possible. Informed him it is important for him to keep appt to see Dr. Alvy Bimler and he needs phlebotomy.  He verbalized understanding.

## 2015-08-14 ENCOUNTER — Telehealth: Payer: Self-pay | Admitting: Hematology and Oncology

## 2015-08-14 NOTE — Telephone Encounter (Signed)
s.w. pt and confirmed appt....pt ok and aware °

## 2015-08-20 ENCOUNTER — Ambulatory Visit (HOSPITAL_BASED_OUTPATIENT_CLINIC_OR_DEPARTMENT_OTHER): Payer: 59

## 2015-08-20 ENCOUNTER — Telehealth: Payer: Self-pay | Admitting: Hematology and Oncology

## 2015-08-20 ENCOUNTER — Encounter: Payer: Self-pay | Admitting: Hematology and Oncology

## 2015-08-20 ENCOUNTER — Ambulatory Visit (HOSPITAL_BASED_OUTPATIENT_CLINIC_OR_DEPARTMENT_OTHER): Payer: 59 | Admitting: Hematology and Oncology

## 2015-08-20 VITALS — BP 129/79 | HR 80 | Temp 98.6°F | Resp 18 | Ht 73.0 in | Wt 245.7 lb

## 2015-08-20 VITALS — BP 113/76 | HR 68 | Temp 98.7°F

## 2015-08-20 DIAGNOSIS — Z720 Tobacco use: Secondary | ICD-10-CM | POA: Diagnosis not present

## 2015-08-20 DIAGNOSIS — D751 Secondary polycythemia: Secondary | ICD-10-CM

## 2015-08-20 DIAGNOSIS — Z299 Encounter for prophylactic measures, unspecified: Secondary | ICD-10-CM

## 2015-08-20 DIAGNOSIS — Z418 Encounter for other procedures for purposes other than remedying health state: Secondary | ICD-10-CM

## 2015-08-20 DIAGNOSIS — Z23 Encounter for immunization: Secondary | ICD-10-CM

## 2015-08-20 MED ORDER — INFLUENZA VAC SPLIT QUAD 0.5 ML IM SUSY
0.5000 mL | PREFILLED_SYRINGE | Freq: Once | INTRAMUSCULAR | Status: AC
  Administered 2015-08-20: 0.5 mL via INTRAMUSCULAR
  Filled 2015-08-20: qty 0.5

## 2015-08-20 NOTE — Assessment & Plan Note (Signed)
I spent some time counseling the patient the importance of tobacco cessation. The patient is not ready to quit smoking.

## 2015-08-20 NOTE — Progress Notes (Signed)
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 Peripheral blood for JAK 2 mutation was negative. The patient was advised to stop smoking. He also underwent sleep study and was told he had mild obstructive sleep apnea but declined treatment for now. In 2015 & 2016, he had multiple phlebotomy sessions. INTERVAL HISTORY: Chris Long 54 y.o. male returns for further follow-up. He continues to smoke and unwilling to quit. He denies any chest pain or shortness of breath. He has occasional headaches. Denies recent diagnosis of blood clots.  I have reviewed the past medical history, past surgical history, social history and family history with the patient and they are unchanged from previous note.  ALLERGIES:  has No Known Allergies.  MEDICATIONS:  Current Outpatient Prescriptions  Medication Sig Dispense Refill  . aspirin 325 MG tablet Take 325 mg by mouth daily.    . fish oil-omega-3 fatty acids 1000 MG capsule Take 2 g by mouth daily.    Marland Kitchen levothyroxine (SYNTHROID, LEVOTHROID) 88 MCG tablet Take 88 mcg by mouth daily.    . metoprolol (LOPRESSOR) 50 MG tablet Take 0.5 tablets (25 mg total) by mouth 2 (two) times daily. 90 tablet 3  . Multiple Vitamin (MULTIVITAMIN) capsule Take 1 capsule by mouth daily.    . rosuvastatin (CRESTOR) 10 MG tablet Take 20 mg by mouth daily.      No current facility-administered medications for this visit.     REVIEW OF SYSTEMS:   Constitutional: Denies fevers, chills or night sweats Eyes: Denies blurriness of vision Ears, nose, mouth, throat, and face: Denies mucositis or sore throat Respiratory: Denies cough, dyspnea or wheezes Cardiovascular: Denies palpitation, chest discomfort or lower extremity  swelling Gastrointestinal:  Denies nausea, heartburn or change in bowel habits Skin: Denies abnormal skin rashes Lymphatics: Denies new lymphadenopathy or easy bruising Neurological:Denies numbness, tingling or new weaknesses Behavioral/Psych: Mood is stable, no new changes  All other systems were reviewed with the patient and are negative.  PHYSICAL EXAMINATION: ECOG PERFORMANCE STATUS: 1 - Symptomatic but completely ambulatory  Filed Vitals:   08/20/15 1501  BP: 129/79  Pulse: 80  Temp: 98.6 F (37 C)  Resp: 18   Filed Weights   08/20/15 1501  Weight: 245 lb 11.2 oz (111.449 kg)    GENERAL:alert, no distress and comfortable. He is obese and appears plethoric SKIN: skin color, texture, turgor are normal, no rashes or significant lesions. There is a lipoma on his right forehead and on his back EYES: normal, Conjunctiva are pink and non-injected, sclera clear OROPHARYNX:no exudate, no erythema and lips, buccal mucosa, and tongue normal  Musculoskeletal:no cyanosis of digits and no clubbing  NEURO: alert & oriented x 3 with fluent speech, no focal motor/sensory deficits  LABORATORY DATA:  I have reviewed the data as listed No results found for this or any previous visit (from the past 48 hour(s)).  Lab Results  Component Value Date   WBC 8.5 01/12/2015   HGB 17.5* 01/12/2015   HCT 51.3* 01/12/2015   MCV 93.6 01/12/2015   PLT 203 01/12/2015  ASSESSMENT & PLAN:  Polycythemia The patient has not quit smoking. He has persistent polycythemia related to smoking. Spent a lot of time with him just discussing the importance of nicotine cessation. I recommend 1 unit of phlebotomy monthly  to keep hemoglobin less than 15 g. We will get CBC check on the monthly basis. He will continue aspirin therapy to reduce risk of blood clots.    Tobacco abuse I spent some time counseling the patient the importance of tobacco cessation. The patient is not ready to quit smoking.  Preventive  measure We discussed the importance of preventive care and reviewed the vaccination programs. He does not have any prior allergic reactions to influenza vaccination. He agrees to proceed with influenza vaccination today and we will administer it today at the clinic.    All questions were answered. The patient knows to call the clinic with any problems, questions or concerns. No barriers to learning was detected.  I spent 15 minutes counseling the patient face to face. The total time spent in the appointment was 20 minutes and more than 50% was on counseling.     [facility], NI, MD 9/26/20163:35 PM

## 2015-08-20 NOTE — Assessment & Plan Note (Signed)
The patient has not quit smoking. He has persistent polycythemia related to smoking. Spent a lot of time with him just discussing the importance of nicotine cessation. I recommend 1 unit of phlebotomy monthly to keep hemoglobin less than 15 g. We will get CBC check on the monthly basis. He will continue aspirin therapy to reduce risk of blood clots.

## 2015-08-20 NOTE — Progress Notes (Signed)
Phlebotomy - 512 ml removed.  Pt observed for 30 minutes.  Discharged to home ambulatory

## 2015-08-20 NOTE — Assessment & Plan Note (Signed)
We discussed the importance of preventive care and reviewed the vaccination programs. He does not have any prior allergic reactions to influenza vaccination. He agrees to proceed with influenza vaccination today and we will administer it today at the clinic.  

## 2015-08-20 NOTE — Telephone Encounter (Signed)
Gave and printed pt appt for OCT..his work sched changes from month to month. So he will stop by every month to sched that up coming month.

## 2015-08-21 ENCOUNTER — Other Ambulatory Visit: Payer: Self-pay | Admitting: Hematology and Oncology

## 2015-09-21 ENCOUNTER — Other Ambulatory Visit: Payer: 59

## 2015-10-09 ENCOUNTER — Telehealth: Payer: Self-pay | Admitting: Hematology and Oncology

## 2015-10-09 NOTE — Telephone Encounter (Signed)
S.w. Pt and r/s missed appt...the patient ok and aware of new d.t °

## 2015-10-12 ENCOUNTER — Other Ambulatory Visit: Payer: Self-pay | Admitting: Hematology and Oncology

## 2015-10-16 ENCOUNTER — Other Ambulatory Visit (HOSPITAL_BASED_OUTPATIENT_CLINIC_OR_DEPARTMENT_OTHER): Payer: 59

## 2015-10-16 ENCOUNTER — Ambulatory Visit (HOSPITAL_BASED_OUTPATIENT_CLINIC_OR_DEPARTMENT_OTHER): Payer: 59

## 2015-10-16 ENCOUNTER — Other Ambulatory Visit: Payer: Self-pay | Admitting: *Deleted

## 2015-10-16 ENCOUNTER — Telehealth: Payer: Self-pay | Admitting: Hematology and Oncology

## 2015-10-16 VITALS — BP 106/82 | HR 80 | Temp 98.0°F | Resp 20

## 2015-10-16 DIAGNOSIS — D751 Secondary polycythemia: Secondary | ICD-10-CM | POA: Diagnosis not present

## 2015-10-16 LAB — CBC WITH DIFFERENTIAL/PLATELET
BASO%: 0.6 % (ref 0.0–2.0)
BASOS ABS: 0.1 10*3/uL (ref 0.0–0.1)
EOS ABS: 0.2 10*3/uL (ref 0.0–0.5)
EOS%: 2.2 % (ref 0.0–7.0)
HEMATOCRIT: 48.4 % (ref 38.4–49.9)
HGB: 16.2 g/dL (ref 13.0–17.1)
LYMPH#: 3.3 10*3/uL (ref 0.9–3.3)
LYMPH%: 29.7 % (ref 14.0–49.0)
MCH: 31.4 pg (ref 27.2–33.4)
MCHC: 33.5 g/dL (ref 32.0–36.0)
MCV: 93.8 fL (ref 79.3–98.0)
MONO#: 1.1 10*3/uL — ABNORMAL HIGH (ref 0.1–0.9)
MONO%: 10.1 % (ref 0.0–14.0)
NEUT#: 6.3 10*3/uL (ref 1.5–6.5)
NEUT%: 57.4 % (ref 39.0–75.0)
Platelets: 205 10*3/uL (ref 140–400)
RBC: 5.16 10*6/uL (ref 4.20–5.82)
RDW: 12.7 % (ref 11.0–14.6)
WBC: 11 10*3/uL — ABNORMAL HIGH (ref 4.0–10.3)

## 2015-10-16 NOTE — Patient Instructions (Signed)
Therapeutic Phlebotomy, Care After  Refer to this sheet in the next few weeks. These instructions provide you with information about caring for yourself after your procedure. Your health care provider may also give you more specific instructions. Your treatment has been planned according to current medical practices, but problems sometimes occur. Call your health care provider if you have any problems or questions after your procedure.  WHAT TO EXPECT AFTER THE PROCEDURE  After your procedure, it is common to have:   Light-headedness or dizziness. You may feel faint.   Nausea.   Tiredness.  HOME CARE INSTRUCTIONS  Activities   Return to your normal activities as directed by your health care provider. Most people can go back to their normal activities right away.   Avoid strenuous physical activity and heavy lifting or pulling for about 5 hours after the procedure. Do not lift anything that is heavier than 10 lb (4.5 kg).   Athletes should avoid strenuous exercise for at least 12 hours.   Change positions slowly for the remainder of the day. This will help to prevent light-headedness or fainting.   If you feel light-headed, lie down until the feeling goes away.  Eating and Drinking   Be sure to eat well-balanced meals for the next 24 hours.   Drink enough fluid to keep your urine clear or pale yellow.   Avoid drinking alcohol on the day that you had the procedure.  Care of the Needle Insertion Site   Keep your bandage dry. You can remove the bandage after about 5 hours or as directed by your health care provider.   If you have bleeding from the needle insertion site, elevate your arm and press firmly on the site until the bleeding stops.   If you have bruising at the site, apply ice to the area:   Put ice in a plastic bag.   Place a towel between your skin and the bag.   Leave the ice on for 20 minutes, 2-3 times a day for the first 24 hours.   If the swelling does not go away after 24 hours, apply  a warm, moist washcloth to the area for 20 minutes, 2-3 times a day.  General Instructions   Avoid smoking for at least 30 minutes after the procedure.   Keep all follow-up visits as directed by your health care provider. It is important to continue with further therapeutic phlebotomy treatments as directed.  SEEK MEDICAL CARE IF:   You have redness, swelling, or pain at the needle insertion site.   You have fluid, blood, or pus coming from the needle insertion site.   You feel light-headed, dizzy, or nauseated, and the feeling does not go away.   You notice new bruising at the needle insertion site.   You feel weaker than normal.   You have a fever or chills.  SEEK IMMEDIATE MEDICAL CARE IF:   You have severe nausea or vomiting.   You have chest pain.   You have trouble breathing.    This information is not intended to replace advice given to you by your health care provider. Make sure you discuss any questions you have with your health care provider.    Document Released: 04/14/2011 Document Revised: 03/27/2015 Document Reviewed: 11/06/2014  Elsevier Interactive Patient Education 2016 [employer] Inc[employer]

## 2015-10-16 NOTE — Progress Notes (Signed)
Phlebotomy to left ac without difficulty.  550 grams removed from 1039-1044AM.  Pressure dressing applied to site at left ac.  Pt ate snack and drink after phlebotomy.  Pt discharged 30 minutes after phleobotmy complete with no difficulties or complaints.  Dressing remains clean, dry and intact to left ac.  Phlebotomy discharge instructions reviewed with pt prior to discharge and he has no questions at this time.  VS stable.

## 2015-10-16 NOTE — Telephone Encounter (Signed)
s.w. pt and advised on appt....pt ok and aware...Chris Kitchenhe could not do any appts before Jan due to being out of town for the holidays

## 2015-10-19 ENCOUNTER — Ambulatory Visit (INDEPENDENT_AMBULATORY_CARE_PROVIDER_SITE_OTHER): Payer: 59 | Admitting: Family Medicine

## 2015-10-19 VITALS — BP 112/64 | HR 80 | Temp 98.5°F | Resp 16 | Ht 73.0 in | Wt 240.0 lb

## 2015-10-19 DIAGNOSIS — L02212 Cutaneous abscess of back [any part, except buttock]: Secondary | ICD-10-CM | POA: Diagnosis not present

## 2015-10-19 NOTE — Progress Notes (Signed)
Patient ID: Chris Long, male    DOB: 04-02-1961  Age: 54 y.o. MRN: [id number]  Chief Complaint  Patient presents with  . 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 It then seemed to be festering up and he went in about 3 days ago and they opened a section backup, draining and packing the wound. He was placed on sulfamethoxazole. He had the packing removed this morning and there is a lot of bloody drainage became with it. Over here to get it checked. That is only been about 2 hours ago.  Current allergies, medications, problem list, past/family and social histories reviewed.  Objective:  BP 112/64 mmHg  Pulse 80  Temp(Src) 98.5 F (36.9 C) (Oral)  Resp 16  Ht 6\' 1"  (1.854 m)  Wt 240 lb (108.863 kg)  BMI 31.67 kg/m2  SpO2 97%  The wound line is already sealed fairly tight, with gentle traction I could not open it. It does not feel fluctuant and does not look inflamed. Decided to not try and reopen for any further drainage.  Assessment & Plan:   Assessment: No diagnosis found.    Plan: Return if he gets worse.  No orders of the defined types were placed in this encounter.    Meds ordered this encounter  Medications  . sulfamethoxazole-trimethoprim (BACTRIM DS,SEPTRA DS) 800-160 MG tablet    Sig: Take 1 tablet by mouth 2 (two) times daily.         There are no Patient Instructions on file for this visit.   No Follow-up on file.   Renella Steig, MD 10/19/2015

## 2015-10-19 NOTE — Patient Instructions (Signed)
Continue to do local care of the wound, but it is my impression that this should heal on up uneventfully from this point. Return if problems. Continue your antibiotics.

## 2015-11-15 ENCOUNTER — Telehealth: Payer: Self-pay | Admitting: *Deleted

## 2015-11-15 NOTE — Telephone Encounter (Signed)
I have called and left a message from the patient to call me back. Need to move appts from the 1/3 to 1/4.

## 2015-11-27 ENCOUNTER — Other Ambulatory Visit: Payer: 59

## 2015-12-25 ENCOUNTER — Other Ambulatory Visit (HOSPITAL_BASED_OUTPATIENT_CLINIC_OR_DEPARTMENT_OTHER): Payer: 59

## 2015-12-25 ENCOUNTER — Ambulatory Visit (HOSPITAL_BASED_OUTPATIENT_CLINIC_OR_DEPARTMENT_OTHER): Payer: 59

## 2015-12-25 VITALS — BP 106/82 | HR 80 | Temp 98.7°F | Resp 18

## 2015-12-25 DIAGNOSIS — D751 Secondary polycythemia: Secondary | ICD-10-CM | POA: Diagnosis not present

## 2015-12-25 LAB — CBC WITH DIFFERENTIAL/PLATELET
BASO%: 0.4 % (ref 0.0–2.0)
BASOS ABS: 0 10*3/uL (ref 0.0–0.1)
EOS%: 2.8 % (ref 0.0–7.0)
Eosinophils Absolute: 0.3 10*3/uL (ref 0.0–0.5)
HCT: 47.4 % (ref 38.4–49.9)
HEMOGLOBIN: 16.1 g/dL (ref 13.0–17.1)
LYMPH#: 3.1 10*3/uL (ref 0.9–3.3)
LYMPH%: 30.7 % (ref 14.0–49.0)
MCH: 31.1 pg (ref 27.2–33.4)
MCHC: 34 g/dL (ref 32.0–36.0)
MCV: 91.5 fL (ref 79.3–98.0)
MONO#: 1 10*3/uL — AB (ref 0.1–0.9)
MONO%: 9.7 % (ref 0.0–14.0)
NEUT%: 56.4 % (ref 39.0–75.0)
NEUTROS ABS: 5.7 10*3/uL (ref 1.5–6.5)
Platelets: 196 10*3/uL (ref 140–400)
RBC: 5.18 10*6/uL (ref 4.20–5.82)
RDW: 12.8 % (ref 11.0–14.6)
WBC: 10.1 10*3/uL (ref 4.0–10.3)

## 2015-12-25 NOTE — Patient Instructions (Signed)
Therapeutic Phlebotomy, Care After  Refer to this sheet in the next few weeks. These instructions provide you with information about caring for yourself after your procedure. Your health care provider may also give you more specific instructions. Your treatment has been planned according to current medical practices, but problems sometimes occur. Call your health care provider if you have any problems or questions after your procedure.  WHAT TO EXPECT AFTER THE PROCEDURE  After your procedure, it is common to have:   Light-headedness or dizziness. You may feel faint.   Nausea.   Tiredness.  HOME CARE INSTRUCTIONS  Activities   Return to your normal activities as directed by your health care provider. Most people can go back to their normal activities right away.   Avoid strenuous physical activity and heavy lifting or pulling for about 5 hours after the procedure. Do not lift anything that is heavier than 10 lb (4.5 kg).   Athletes should avoid strenuous exercise for at least 12 hours.   Change positions slowly for the remainder of the day. This will help to prevent light-headedness or fainting.   If you feel light-headed, lie down until the feeling goes away.  Eating and Drinking   Be sure to eat well-balanced meals for the next 24 hours.   Drink enough fluid to keep your urine clear or pale yellow.   Avoid drinking alcohol on the day that you had the procedure.  Care of the Needle Insertion Site   Keep your bandage dry. You can remove the bandage after about 5 hours or as directed by your health care provider.   If you have bleeding from the needle insertion site, elevate your arm and press firmly on the site until the bleeding stops.   If you have bruising at the site, apply ice to the area:   Put ice in a plastic bag.   Place a towel between your skin and the bag.   Leave the ice on for 20 minutes, 2-3 times a day for the first 24 hours.   If the swelling does not go away after 24 hours, apply  a warm, moist washcloth to the area for 20 minutes, 2-3 times a day.  General Instructions   Avoid smoking for at least 30 minutes after the procedure.   Keep all follow-up visits as directed by your health care provider. It is important to continue with further therapeutic phlebotomy treatments as directed.  SEEK MEDICAL CARE IF:   You have redness, swelling, or pain at the needle insertion site.   You have fluid, blood, or pus coming from the needle insertion site.   You feel light-headed, dizzy, or nauseated, and the feeling does not go away.   You notice new bruising at the needle insertion site.   You feel weaker than normal.   You have a fever or chills.  SEEK IMMEDIATE MEDICAL CARE IF:   You have severe nausea or vomiting.   You have chest pain.   You have trouble breathing.    This information is not intended to replace advice given to you by your health care provider. Make sure you discuss any questions you have with your health care provider.    Document Released: 04/14/2011 Document Revised: 03/27/2015 Document Reviewed: 11/06/2014  Elsevier Interactive Patient Education 2016 [employer] Inc[employer]

## 2016-01-11 ENCOUNTER — Other Ambulatory Visit: Payer: Self-pay | Admitting: Hematology and Oncology

## 2016-01-22 ENCOUNTER — Other Ambulatory Visit: Payer: 59

## 2016-02-22 ENCOUNTER — Other Ambulatory Visit (HOSPITAL_BASED_OUTPATIENT_CLINIC_OR_DEPARTMENT_OTHER): Payer: 59

## 2016-02-22 ENCOUNTER — Ambulatory Visit (HOSPITAL_BASED_OUTPATIENT_CLINIC_OR_DEPARTMENT_OTHER): Payer: 59 | Admitting: Hematology and Oncology

## 2016-02-22 ENCOUNTER — Ambulatory Visit (HOSPITAL_BASED_OUTPATIENT_CLINIC_OR_DEPARTMENT_OTHER): Payer: 59

## 2016-02-22 ENCOUNTER — Encounter: Payer: Self-pay | Admitting: Hematology and Oncology

## 2016-02-22 VITALS — BP 128/82 | HR 87 | Temp 98.9°F | Resp 18 | Ht 73.0 in | Wt 240.2 lb

## 2016-02-22 VITALS — BP 111/74 | HR 90 | Temp 98.3°F

## 2016-02-22 DIAGNOSIS — R05 Cough: Secondary | ICD-10-CM

## 2016-02-22 DIAGNOSIS — D751 Secondary polycythemia: Secondary | ICD-10-CM

## 2016-02-22 DIAGNOSIS — Z720 Tobacco use: Secondary | ICD-10-CM | POA: Diagnosis not present

## 2016-02-22 DIAGNOSIS — R053 Chronic cough: Secondary | ICD-10-CM

## 2016-02-22 HISTORY — DX: Chronic cough: R05.3

## 2016-02-22 LAB — CBC WITH DIFFERENTIAL/PLATELET
BASO%: 0.9 % (ref 0.0–2.0)
Basophils Absolute: 0.1 10*3/uL (ref 0.0–0.1)
EOS ABS: 0.2 10*3/uL (ref 0.0–0.5)
EOS%: 1.9 % (ref 0.0–7.0)
HCT: 51.9 % — ABNORMAL HIGH (ref 38.4–49.9)
HGB: 17 g/dL (ref 13.0–17.1)
LYMPH%: 31.2 % (ref 14.0–49.0)
MCH: 29.8 pg (ref 27.2–33.4)
MCHC: 32.7 g/dL (ref 32.0–36.0)
MCV: 91.3 fL (ref 79.3–98.0)
MONO#: 1.2 10*3/uL — AB (ref 0.1–0.9)
MONO%: 9.9 % (ref 0.0–14.0)
NEUT#: 6.9 10*3/uL — ABNORMAL HIGH (ref 1.5–6.5)
NEUT%: 56.1 % (ref 39.0–75.0)
Platelets: 205 10*3/uL (ref 140–400)
RBC: 5.68 10*6/uL (ref 4.20–5.82)
RDW: 13.4 % (ref 11.0–14.6)
WBC: 12.3 10*3/uL — ABNORMAL HIGH (ref 4.0–10.3)
lymph#: 3.8 10*3/uL — ABNORMAL HIGH (ref 0.9–3.3)

## 2016-02-22 NOTE — Patient Instructions (Signed)

## 2016-02-22 NOTE — Assessment & Plan Note (Signed)
He has mild, chronic persistent coughing. The patient is a chronic smoker. Examination is benign but I recommend low-dose CT chest for lung cancer screening. I will call him with test results next week.

## 2016-02-22 NOTE — Progress Notes (Signed)
Therapeutic phlebotomy performed. Tolerated procedure well. PO fluids and snack provided. Discharged in stable condition.

## 2016-02-22 NOTE — Progress Notes (Signed)
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 Peripheral blood for JAK 2 mutation was negative. The patient was advised to stop smoking. He also underwent sleep study and was told he had mild obstructive sleep apnea but declined treatment for now. Since 2015 he had multiple phlebotomy sessions.  INTERVAL HISTORY: Please see below for problem oriented charting. His only complaint is chronic cough. The cough is typically dry cough. He denies hemoptysis. Denies chest pain or recent weight loss. He continues to smoke and unwilling to quit. He tolerated phlebotomy well.  REVIEW OF SYSTEMS:   Constitutional: Denies fevers, chills or abnormal weight loss Eyes: Denies blurriness of vision Ears, nose, mouth, throat, and face: Denies mucositis or sore throat Cardiovascular: Denies palpitation, chest discomfort or lower extremity swelling Gastrointestinal:  Denies nausea, heartburn or change in bowel habits Skin: Denies abnormal skin rashes Lymphatics: Denies new lymphadenopathy or easy bruising Neurological:Denies numbness, tingling or new weaknesses Behavioral/Psych: Mood is stable, no new changes  All other systems were reviewed with the patient and are negative.  I have reviewed the past medical history, past surgical history, social history and family history with the patient and they are unchanged from previous note.  ALLERGIES:  has No Known Allergies.  MEDICATIONS:  Current Outpatient Prescriptions  Medication Sig Dispense Refill  . aspirin  325 MG tablet Take 325 mg by mouth daily.    Marland Kitchen ezetimibe (ZETIA) 10 MG tablet Take 10 mg by mouth daily.  3  . fish oil-omega-3 fatty acids 1000 MG capsule Take 2 g by mouth daily.    Marland Kitchen JARDIANCE 10 MG TABS tablet Take 10 mg by mouth daily.  6  . levothyroxine (SYNTHROID, LEVOTHROID) 88 MCG tablet Take 88 mcg by mouth daily.    Marland Kitchen lisinopril (PRINIVIL,ZESTRIL) 10 MG tablet Take 10 mg by mouth daily.  3  . metFORMIN (GLUCOPHAGE-XR) 500 MG 24 hr tablet Take 500 mg by mouth daily.  6  . metoprolol (LOPRESSOR) 50 MG tablet Take 0.5 tablets (25 mg total) by mouth 2 (two) times daily. 90 tablet 3  . Multiple Vitamin (MULTIVITAMIN) capsule Take 1 capsule by mouth daily.    . rosuvastatin (CRESTOR) 10 MG tablet Take 20 mg by mouth daily.      No current facility-administered medications for this visit.    PHYSICAL EXAMINATION: ECOG PERFORMANCE STATUS: 0 - Asymptomatic  Filed Vitals:   02/22/16 1247  BP: 128/82  Pulse: 87  Temp: 98.9 F (37.2 C)  Resp: 18   Filed Weights   02/22/16 1247  Weight: 240 lb 3.2 oz (108.954 kg)    GENERAL:alert, no distress and comfortable SKIN: skin color, texture, turgor are normal, no rashes or significant lesions EYES: normal, Conjunctiva are pink and non-injected, sclera clear OROPHARYNX:no exudate, no erythema and lips, buccal mucosa, and tongue normal  NECK: supple, thyroid normal size, non-tender, without nodularity LYMPH:  no palpable lymphadenopathy in the cervical, axillary or inguinal LUNGS: clear to auscultation and percussion with normal breathing effort HEART: regular rate & rhythm and no murmurs and no lower extremity edema ABDOMEN:abdomen soft, non-tender and normal bowel sounds Musculoskeletal:no  cyanosis of digits and no clubbing  NEURO: alert & oriented x 3 with fluent speech, no focal motor/sensory deficits  LABORATORY DATA:  I have reviewed the data as listed    Component Value Date/Time   NA 139 09/13/2014 1315   K 4.7 09/13/2014  1315   CL 102 09/13/2014 1315   CO2 23 09/13/2014 1315   GLUCOSE 114* 09/13/2014 1315   BUN 15 09/13/2014 1315   CREATININE 0.90 09/13/2014 1315   CALCIUM 9.7 09/13/2014 1315   PROT 7.2 09/13/2014 1315   ALBUMIN 3.9 09/13/2014 1315   AST 21 09/13/2014 1315   ALT 32 09/13/2014 1315   ALKPHOS 60 09/13/2014 1315   BILITOT 0.3 09/13/2014 1315   GFRNONAA >90 09/13/2014 1315   GFRAA >90 09/13/2014 1315    No results found for: SPEP, UPEP  Lab Results  Component Value Date   WBC 12.3* 02/22/2016   NEUTROABS 6.9* 02/22/2016   HGB 17.0 02/22/2016   HCT 51.9* 02/22/2016   MCV 91.3 02/22/2016   PLT 205 02/22/2016      Chemistry      Component Value Date/Time   NA 139 09/13/2014 1315   K 4.7 09/13/2014 1315   CL 102 09/13/2014 1315   CO2 23 09/13/2014 1315   BUN 15 09/13/2014 1315   CREATININE 0.90 09/13/2014 1315      Component Value Date/Time   CALCIUM 9.7 09/13/2014 1315   ALKPHOS 60 09/13/2014 1315   AST 21 09/13/2014 1315   ALT 32 09/13/2014 1315   BILITOT 0.3 09/13/2014 1315      ASSESSMENT & PLAN:  Polycythemia The patient has not quit smoking. 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 I recommend 1 unit of phlebotomy monthly to keep hemoglobin less than 17 g. He will continue aspirin therapy to reduce risk of blood clots. With recent changes in healthcare law, there is no contraindication for him to donate blood. I recommend he proceed to do blood donation every 8 weeks and to continue close follow-up with his primary care doctor for blood count monitoring. If for some reasons, he was refused blood donation, I will bring him back here for phlebotomy every other month.  Tobacco abuse I spent some time counseling the patient the importance of tobacco cessation. The patient is not ready to quit smoking.    Cough, persistent He has mild, chronic  persistent coughing. The patient is a chronic smoker. Examination is benign but I recommend low-dose CT chest for lung cancer screening. I will call him with test results next week.   Orders Placed This Encounter  Procedures  . CT Chest Limited W/O Cm    Standing Status: Future     Number of Occurrences:      Standing Expiration Date: 03/28/2017    Order Specific Question:  Reason for exam:    Answer:  chronic cough, smoker    Order Specific Question:  Preferred imaging location?    Answer:  [facility]   All questions were answered. The patient knows to call the clinic with any problems, questions or concerns. No barriers to learning was detected. I spent 15 minutes counseling the patient face to face. The total time spent in the appointment was 20 minutes and more than 50% was on counseling and review of test results     [facility], [place], MD 02/22/2016 1:08 PM

## 2016-02-22 NOTE — Assessment & Plan Note (Signed)
I spent some time counseling the patient the importance of tobacco cessation. The patient is not ready to quit smoking. 

## 2016-02-22 NOTE — Assessment & Plan Note (Signed)
The patient has not quit smoking. 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 I recommend 1 unit of phlebotomy monthly to keep hemoglobin less than 17 g. He will continue aspirin therapy to reduce risk of blood clots. With recent changes in healthcare law, there is no contraindication for him to donate blood. I recommend he proceed to do blood donation every 8 weeks and to continue close follow-up with his primary care doctor for blood count monitoring. If for some reasons, he was refused blood donation, I will bring him back here for phlebotomy every other month.

## 2016-02-29 ENCOUNTER — Other Ambulatory Visit: Payer: Self-pay | Admitting: Hematology and Oncology

## 2016-02-29 ENCOUNTER — Ambulatory Visit (HOSPITAL_COMMUNITY)
Admission: RE | Admit: 2016-02-29 | Discharge: 2016-02-29 | Disposition: A | Discharge status: Home / Self Care | Payer: 59 | Source: Ambulatory Visit | Attending: Hematology and Oncology | Admitting: Hematology and Oncology

## 2016-02-29 DIAGNOSIS — R05 Cough: Secondary | ICD-10-CM | POA: Diagnosis not present

## 2016-02-29 DIAGNOSIS — Z720 Tobacco use: Secondary | ICD-10-CM

## 2016-02-29 DIAGNOSIS — J439 Emphysema, unspecified: Secondary | ICD-10-CM | POA: Diagnosis not present

## 2016-02-29 DIAGNOSIS — R053 Chronic cough: Secondary | ICD-10-CM

## 2016-03-11 ENCOUNTER — Telehealth: Payer: Self-pay | Admitting: *Deleted

## 2016-03-11 NOTE — Telephone Encounter (Signed)
Informed pt of CT results per Dr. Alvy Bimler and encouraged him to quit smoking to reduce his chances of developing cancer and worsening Emphysema in the future.  Informed him of Quit Smoking classes offered by [facility]  Pt verbalized understanding and declined information about the classes at this time.  Informed him he can find information in future if interested.

## 2016-03-11 NOTE — Telephone Encounter (Signed)
-----   Message from Heath Lark, MD sent at 03/11/2016  1:32 PM EDT ----- Regarding: CT scan CT negative Showed some mild emphysema. Encourage him to quit smoking pls ----- Message -----    From: Rad Results In Interface    Sent: 03/11/2016  10:24 AM      To: Heath Lark, MD

## 2016-07-25 ENCOUNTER — Telehealth: Payer: Self-pay | Admitting: *Deleted

## 2016-07-25 NOTE — Telephone Encounter (Signed)
Received lab results and note from Dr. Keane Police office stating pt needs to schedule appt w/ our office for Phlebotomy.  His Hgb is 18.7. According to Dr. Calton Dach last office note, pt was planning to donate blood monthly at blood donation center.   I called pt and he says he has not been able to donate blood. He says "they still don't allow gay men to donate."  He is agreeable to be scheduled here for phlebotomies as needed.  Fridays work best for him.  Informed pt will notify Dr. Alvy Bimler and will call him back next week.  He verbalized understanding.

## 2016-07-29 ENCOUNTER — Other Ambulatory Visit: Payer: Self-pay | Admitting: Hematology and Oncology

## 2016-07-29 NOTE — Telephone Encounter (Signed)
I placed scheduling msg The patient should get a callback to start phlebotomy this Friday

## 2016-07-31 ENCOUNTER — Telehealth: Payer: Self-pay | Admitting: *Deleted

## 2016-07-31 NOTE — Telephone Encounter (Signed)
Per LOS I have scheduled appts for tomorrow per desk RN. Desk RN will call the patient

## 2016-07-31 NOTE — Telephone Encounter (Signed)
Notified pt of appt tomorrow for lab and phlebotomy.   Arrive at 3 pm for 3:15 lab appt to be followed by phlebotomy.  He verbalized understanding.

## 2016-08-01 ENCOUNTER — Other Ambulatory Visit (HOSPITAL_BASED_OUTPATIENT_CLINIC_OR_DEPARTMENT_OTHER): Payer: 59

## 2016-08-01 ENCOUNTER — Ambulatory Visit (HOSPITAL_BASED_OUTPATIENT_CLINIC_OR_DEPARTMENT_OTHER): Payer: 59

## 2016-08-01 VITALS — BP 115/80 | HR 82 | Temp 98.2°F | Resp 16

## 2016-08-01 DIAGNOSIS — D751 Secondary polycythemia: Secondary | ICD-10-CM | POA: Diagnosis not present

## 2016-08-01 LAB — CBC WITH DIFFERENTIAL/PLATELET
BASO%: 0.5 % (ref 0.0–2.0)
Basophils Absolute: 0.1 10*3/uL (ref 0.0–0.1)
EOS%: 1.7 % (ref 0.0–7.0)
Eosinophils Absolute: 0.2 10*3/uL (ref 0.0–0.5)
HEMATOCRIT: 52 % — AB (ref 38.4–49.9)
HGB: 18 g/dL — ABNORMAL HIGH (ref 13.0–17.1)
LYMPH#: 4.2 10*3/uL — AB (ref 0.9–3.3)
LYMPH%: 32.5 % (ref 14.0–49.0)
MCH: 31.3 pg (ref 27.2–33.4)
MCHC: 34.6 g/dL (ref 32.0–36.0)
MCV: 90.4 fL (ref 79.3–98.0)
MONO#: 1.2 10*3/uL — AB (ref 0.1–0.9)
MONO%: 9.1 % (ref 0.0–14.0)
NEUT%: 56.2 % (ref 39.0–75.0)
NEUTROS ABS: 7.3 10*3/uL — AB (ref 1.5–6.5)
Platelets: 181 10*3/uL (ref 140–400)
RBC: 5.75 10*6/uL (ref 4.20–5.82)
RDW: 13.9 % (ref 11.0–14.6)
WBC: 13 10*3/uL — AB (ref 4.0–10.3)

## 2016-08-01 NOTE — Patient Instructions (Signed)

## 2016-08-01 NOTE — Progress Notes (Signed)
Performed phlebotomy treatment today per Dr. Alvy Bimler parameters of Hg 17 or greater. Pt Hg level at 18 today. Used 16g phlebotomy kit on RAC. Pt tolerated well over 5 minutes of phlebotomy treatment. Blood output total of 550cc. Pt asymptomatic. Hydration and nutrition provided during 30 min post observation. VSS upon discharge. AVS and lab results given.

## 2016-08-07 ENCOUNTER — Other Ambulatory Visit: Payer: Self-pay | Admitting: Internal Medicine

## 2016-08-07 DIAGNOSIS — N6310 Unspecified lump in the right breast, unspecified quadrant: Secondary | ICD-10-CM

## 2016-08-19 ENCOUNTER — Other Ambulatory Visit: Payer: Self-pay | Admitting: Internal Medicine

## 2016-08-19 DIAGNOSIS — N6310 Unspecified lump in the right breast, unspecified quadrant: Secondary | ICD-10-CM

## 2016-08-26 ENCOUNTER — Ambulatory Visit
Admission: RE | Admit: 2016-08-26 | Discharge: 2016-08-26 | Disposition: A | Discharge status: Home / Self Care | Payer: 59 | Source: Ambulatory Visit | Attending: Internal Medicine | Admitting: Internal Medicine

## 2016-08-26 DIAGNOSIS — N6310 Unspecified lump in the right breast, unspecified quadrant: Secondary | ICD-10-CM

## 2016-08-28 ENCOUNTER — Other Ambulatory Visit: Payer: Self-pay | Admitting: Hematology and Oncology

## 2016-08-28 DIAGNOSIS — D751 Secondary polycythemia: Secondary | ICD-10-CM

## 2016-08-29 ENCOUNTER — Other Ambulatory Visit: Payer: 59

## 2016-09-03 ENCOUNTER — Other Ambulatory Visit: Payer: Self-pay | Admitting: Hematology and Oncology

## 2016-09-03 ENCOUNTER — Other Ambulatory Visit (HOSPITAL_BASED_OUTPATIENT_CLINIC_OR_DEPARTMENT_OTHER): Payer: 59

## 2016-09-03 ENCOUNTER — Ambulatory Visit (HOSPITAL_BASED_OUTPATIENT_CLINIC_OR_DEPARTMENT_OTHER): Payer: 59

## 2016-09-03 VITALS — BP 128/78 | HR 76 | Temp 98.6°F | Resp 18

## 2016-09-03 DIAGNOSIS — D751 Secondary polycythemia: Secondary | ICD-10-CM

## 2016-09-03 LAB — CBC WITH DIFFERENTIAL/PLATELET
BASO%: 0.4 % (ref 0.0–2.0)
BASOS ABS: 0 10*3/uL (ref 0.0–0.1)
EOS ABS: 0.2 10*3/uL (ref 0.0–0.5)
EOS%: 1.9 % (ref 0.0–7.0)
HEMATOCRIT: 49.9 % (ref 38.4–49.9)
HEMOGLOBIN: 17 g/dL (ref 13.0–17.1)
LYMPH%: 32.8 % (ref 14.0–49.0)
MCH: 31.3 pg (ref 27.2–33.4)
MCHC: 34.1 g/dL (ref 32.0–36.0)
MCV: 91.7 fL (ref 79.3–98.0)
MONO#: 0.8 10*3/uL (ref 0.1–0.9)
MONO%: 8.9 % (ref 0.0–14.0)
NEUT#: 4.8 10*3/uL (ref 1.5–6.5)
NEUT%: 56 % (ref 39.0–75.0)
Platelets: 165 10*3/uL (ref 140–400)
RBC: 5.44 10*6/uL (ref 4.20–5.82)
RDW: 13.8 % (ref 11.0–14.6)
WBC: 8.5 10*3/uL (ref 4.0–10.3)
lymph#: 2.8 10*3/uL (ref 0.9–3.3)

## 2016-09-03 MED ORDER — SODIUM CHLORIDE 0.9 % IV SOLN
Freq: Once | INTRAVENOUS | Status: AC
  Administered 2016-09-03: 11:00:00 via INTRAVENOUS

## 2016-09-03 NOTE — Progress Notes (Signed)
Pt BP 100/85 prior to phlebotomy. Notified MD, since pt does need phlebotomy per treatment parameters. Hg. 17 today.Pt states that he takes 2 bp medications and just started on losartan 1 week ago. Awaiting for further orders at this time.  1110- Pt okay to continue with phlebotomy treatment today, followed by IVF's 500/hr over 30 min, due to low BP. Pt agreeable.  1125- Pt completed phlebotomy with 530 ml of blood output, using 18g needle. Pt was asymptomatic throughout phlebotomy. On 30 min post phlebotomy observation with IVF's. Provided pt with nutrition and oral hydration.   1150- Pt done with observation. VSS upon discharge. Refused AVS at this time.

## 2016-09-22 IMAGING — CR DG CHEST 2V
2 series · 2 of 2 positions shown · non-contrast
Comparison: PA and lateral chest X ray August 27, 2009.

CLINICAL DATA: Mid thoracic non radiating chest pain; history of
hypertension and high cholesterol and mild coronary artery disease
on previous catheterization; chronic tobacco use

EXAM:
CHEST  2 VIEW

[w chest pa]
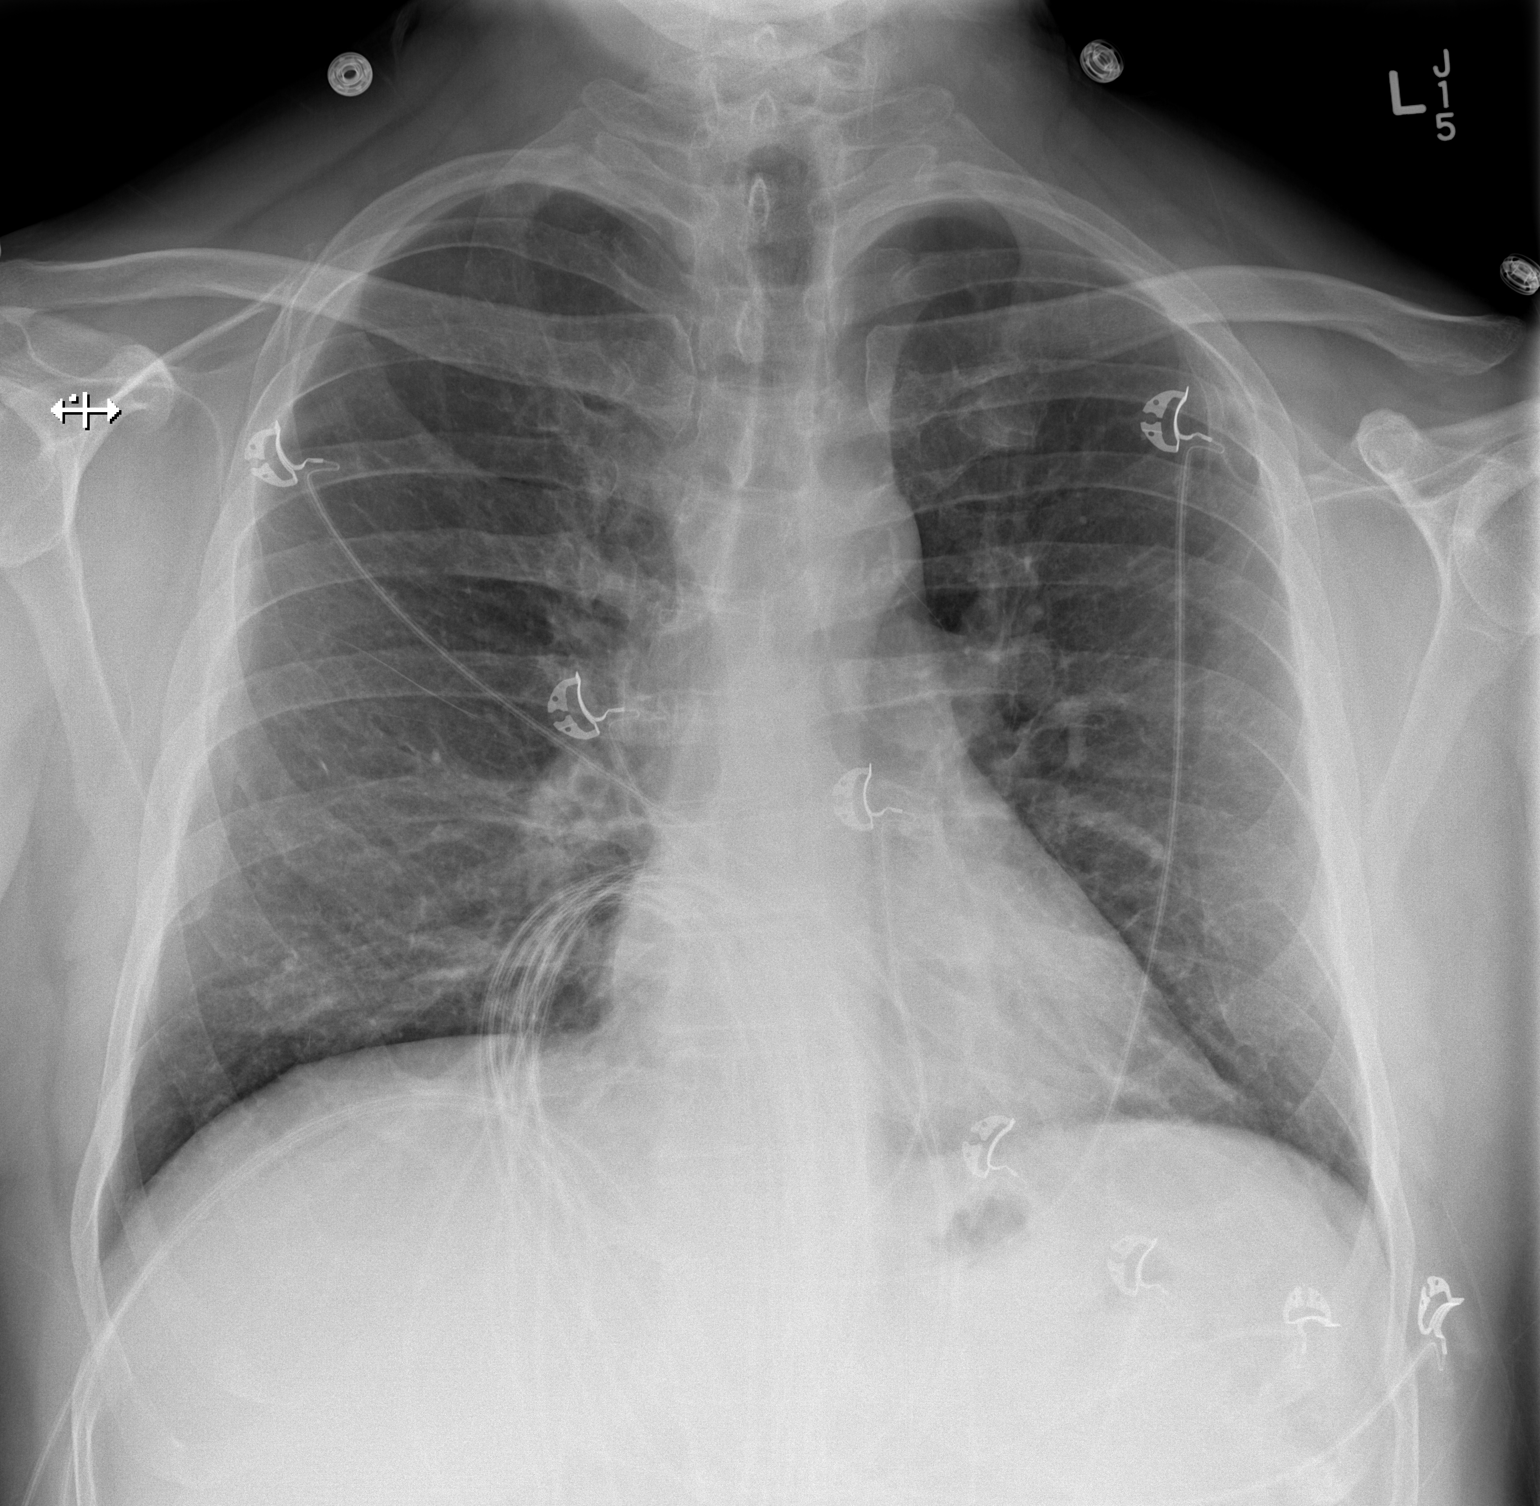

[w chest lat]
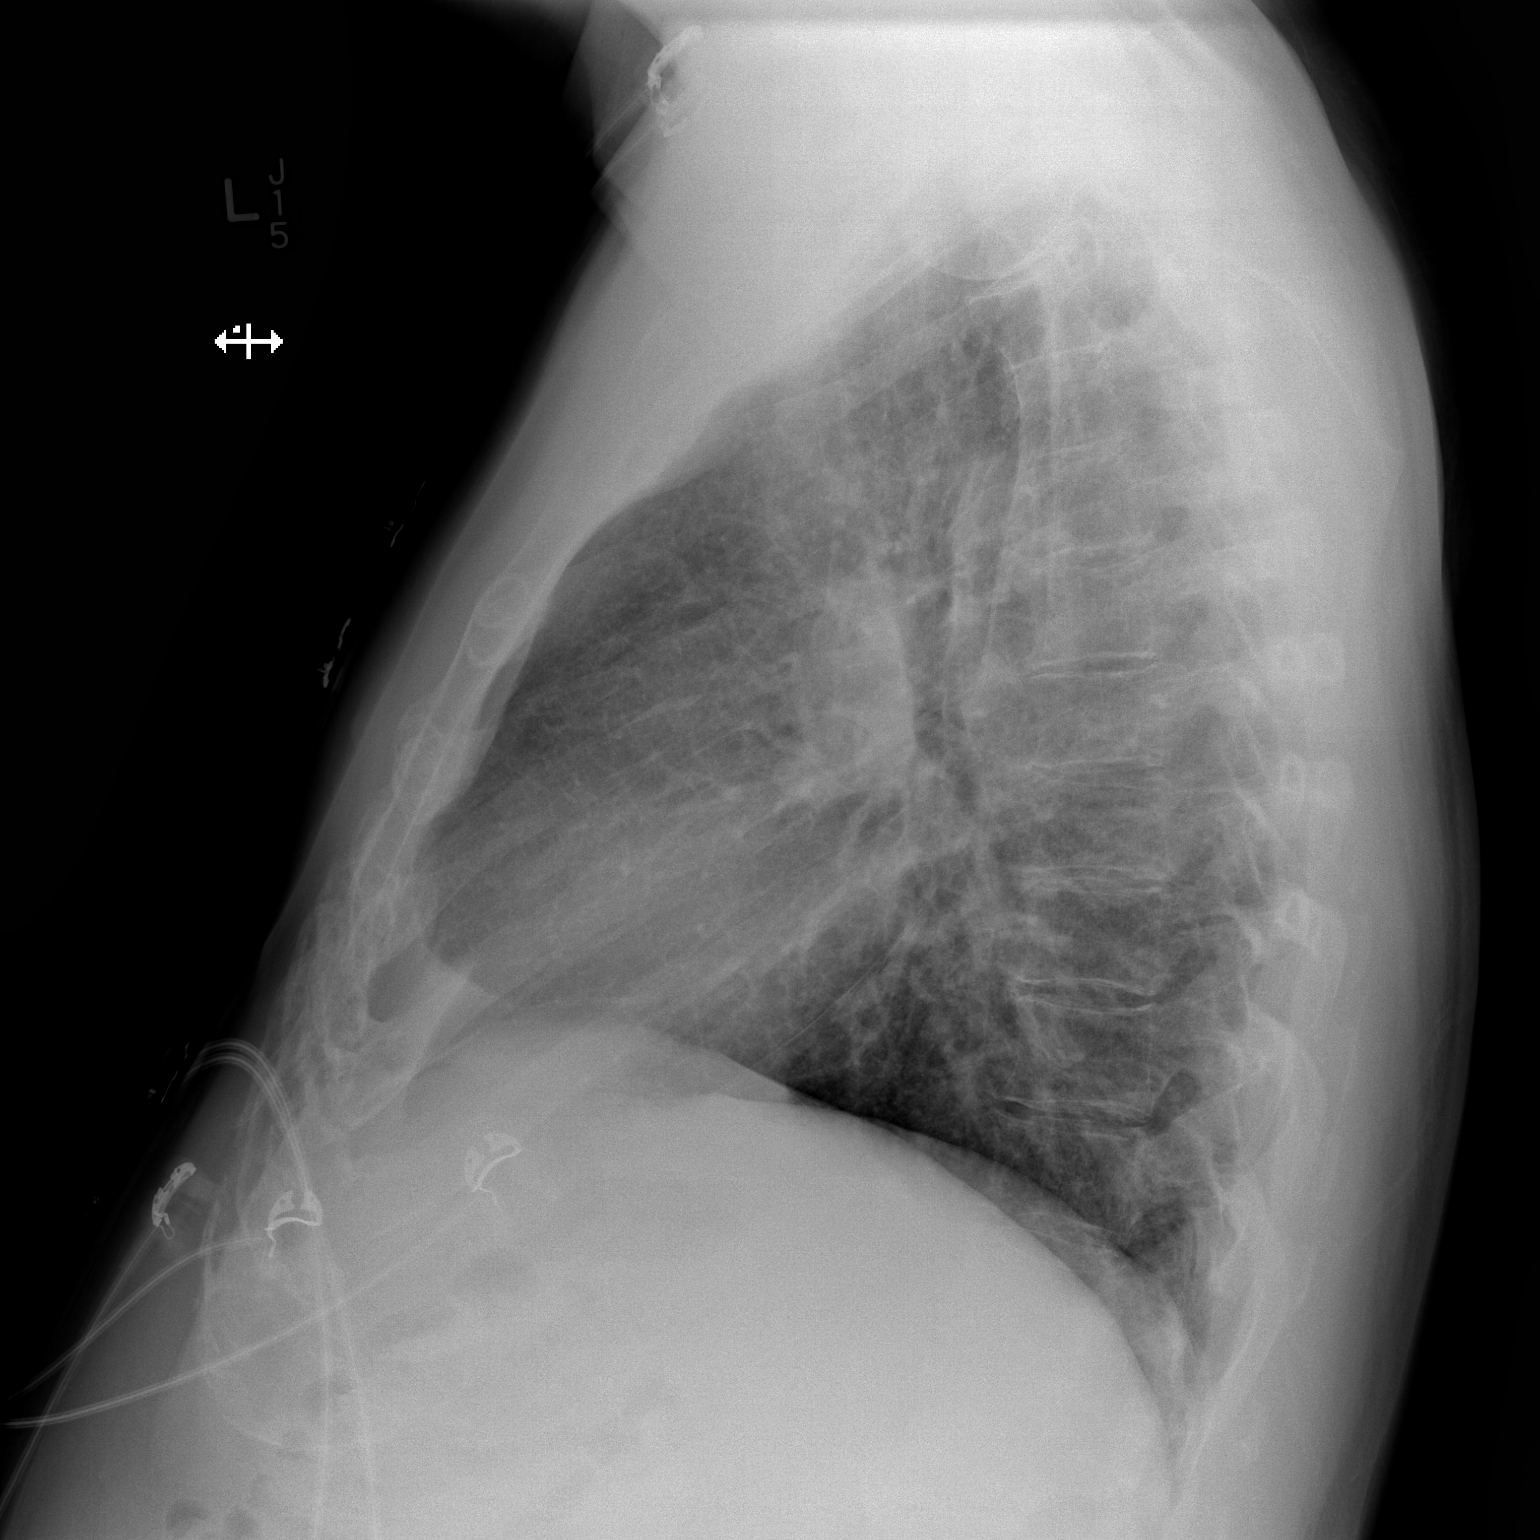

[2 of 2 positions shown; findings below may reference images not displayed]

FINDINGS: The lungs are adequately inflated. There is no focal infiltrate. The
interstitial markings are chronically increased but stable. The
heart and pulmonary vascularity are normal. The mediastinum is
normal in width. There is no pleural effusion or pneumothorax. There
is degenerative disc change at multiple thoracic levels.
IMPRESSION: There is no evidence of CHF nor of pneumonia. Mild prominence of the
pulmonary interstitial markings likely reflects the patient's
chronic smoking history.

## 2016-09-26 ENCOUNTER — Ambulatory Visit: Payer: 59

## 2016-09-26 ENCOUNTER — Other Ambulatory Visit (HOSPITAL_BASED_OUTPATIENT_CLINIC_OR_DEPARTMENT_OTHER): Payer: 59

## 2016-09-26 DIAGNOSIS — D751 Secondary polycythemia: Secondary | ICD-10-CM

## 2016-09-26 LAB — CBC WITH DIFFERENTIAL/PLATELET
BASO%: 1.1 % (ref 0.0–2.0)
Basophils Absolute: 0.1 10*3/uL (ref 0.0–0.1)
EOS ABS: 0.3 10*3/uL (ref 0.0–0.5)
EOS%: 2.2 % (ref 0.0–7.0)
HCT: 49.5 % (ref 38.4–49.9)
HEMOGLOBIN: 16.2 g/dL (ref 13.0–17.1)
LYMPH%: 32.2 % (ref 14.0–49.0)
MCH: 30 pg (ref 27.2–33.4)
MCHC: 32.7 g/dL (ref 32.0–36.0)
MCV: 91.7 fL (ref 79.3–98.0)
MONO#: 1.3 10*3/uL — AB (ref 0.1–0.9)
MONO%: 11.5 % (ref 0.0–14.0)
NEUT%: 53 % (ref 39.0–75.0)
NEUTROS ABS: 6.2 10*3/uL (ref 1.5–6.5)
PLATELETS: 208 10*3/uL (ref 140–400)
RBC: 5.4 10*6/uL (ref 4.20–5.82)
RDW: 13.4 % (ref 11.0–14.6)
WBC: 11.7 10*3/uL — AB (ref 4.0–10.3)
lymph#: 3.8 10*3/uL — ABNORMAL HIGH (ref 0.9–3.3)

## 2016-09-26 NOTE — Progress Notes (Unsigned)
1600- Patient did not meet parameters for needing a phlebotomy today. Spoke with patient in lobby and printed his lab results. Patient is feeling well and will call us with any issues between now and his next appointment.

## 2016-10-24 ENCOUNTER — Other Ambulatory Visit (HOSPITAL_BASED_OUTPATIENT_CLINIC_OR_DEPARTMENT_OTHER): Payer: 59

## 2016-10-24 ENCOUNTER — Ambulatory Visit: Payer: 59

## 2016-10-24 DIAGNOSIS — D751 Secondary polycythemia: Secondary | ICD-10-CM

## 2016-10-24 LAB — CBC WITH DIFFERENTIAL/PLATELET
BASO%: 0.3 % (ref 0.0–2.0)
BASOS ABS: 0 10*3/uL (ref 0.0–0.1)
EOS%: 2.3 % (ref 0.0–7.0)
Eosinophils Absolute: 0.3 10*3/uL (ref 0.0–0.5)
HCT: 49.8 % (ref 38.4–49.9)
HGB: 16.7 g/dL (ref 13.0–17.1)
LYMPH%: 29.1 % (ref 14.0–49.0)
MCH: 31.2 pg (ref 27.2–33.4)
MCHC: 33.5 g/dL (ref 32.0–36.0)
MCV: 93.1 fL (ref 79.3–98.0)
MONO#: 1 10*3/uL — ABNORMAL HIGH (ref 0.1–0.9)
MONO%: 8.7 % (ref 0.0–14.0)
NEUT#: 7 10*3/uL — ABNORMAL HIGH (ref 1.5–6.5)
NEUT%: 59.6 % (ref 39.0–75.0)
Platelets: 201 10*3/uL (ref 140–400)
RBC: 5.35 10*6/uL (ref 4.20–5.82)
RDW: 13.3 % (ref 11.0–14.6)
WBC: 11.8 10*3/uL — ABNORMAL HIGH (ref 4.0–10.3)
lymph#: 3.4 10*3/uL — ABNORMAL HIGH (ref 0.9–3.3)

## 2016-10-24 NOTE — Progress Notes (Signed)
Hgb 16.7.  Per physician's note, no phlebotomy needed for Hgb less than 17.  Spoke with pt in lobby.  No c/o any symptoms.  No concerns.  Pt given a copy of lab work and will return for next scheduled appt.

## 2016-11-19 ENCOUNTER — Telehealth: Payer: Self-pay | Admitting: Hematology and Oncology

## 2016-11-19 NOTE — Telephone Encounter (Signed)
Pt called to r/s 12/29 appt to 1/5

## 2016-11-21 ENCOUNTER — Other Ambulatory Visit: Payer: 59

## 2016-11-28 ENCOUNTER — Other Ambulatory Visit (HOSPITAL_BASED_OUTPATIENT_CLINIC_OR_DEPARTMENT_OTHER): Payer: 59

## 2016-11-28 ENCOUNTER — Ambulatory Visit (HOSPITAL_BASED_OUTPATIENT_CLINIC_OR_DEPARTMENT_OTHER): Payer: 59

## 2016-11-28 VITALS — BP 112/75 | HR 67 | Temp 98.5°F | Resp 18

## 2016-11-28 DIAGNOSIS — D751 Secondary polycythemia: Secondary | ICD-10-CM

## 2016-11-28 LAB — CBC WITH DIFFERENTIAL/PLATELET
BASO%: 0.5 % (ref 0.0–2.0)
Basophils Absolute: 0.1 10*3/uL (ref 0.0–0.1)
EOS%: 2.1 % (ref 0.0–7.0)
Eosinophils Absolute: 0.2 10*3/uL (ref 0.0–0.5)
HCT: 53.7 % — ABNORMAL HIGH (ref 38.4–49.9)
HGB: 18 g/dL — ABNORMAL HIGH (ref 13.0–17.1)
LYMPH#: 3.2 10*3/uL (ref 0.9–3.3)
LYMPH%: 30.1 % (ref 14.0–49.0)
MCH: 30.4 pg (ref 27.2–33.4)
MCHC: 33.5 g/dL (ref 32.0–36.0)
MCV: 90.7 fL (ref 79.3–98.0)
MONO#: 1 10*3/uL — ABNORMAL HIGH (ref 0.1–0.9)
MONO%: 9.2 % (ref 0.0–14.0)
NEUT#: 6.2 10*3/uL (ref 1.5–6.5)
NEUT%: 58.1 % (ref 39.0–75.0)
NRBC: 0 % (ref 0–0)
Platelets: 193 10*3/uL (ref 140–400)
RBC: 5.92 10*6/uL — AB (ref 4.20–5.82)
RDW: 13.4 % (ref 11.0–14.6)
WBC: 10.7 10*3/uL — ABNORMAL HIGH (ref 4.0–10.3)

## 2016-11-28 NOTE — Progress Notes (Signed)
Phlebotomy performed using 16 g needle and phlebotomy set on Right AC.  500 grams blood drained over about 5 minutes.  Pt tolerated well.  Encouraged to drink water and eat snack while waiting for 30 min post observation.

## 2016-11-28 NOTE — Patient Instructions (Signed)
Therapeutic Phlebotomy Discharge Instructions  - Increase your fluid intake over the next 4 hours  - No smoking for 30 minutes  - Avoid using the affected arm (the one you had the blood drawn from) for heavy lifting or other activities.  - You may resume all normal activities after 30 minutes.  You are to notify the office if you experience:   - Persistent dizziness and/or lightheadedness -Uncontrolled or excessive bleeding at the site.   Therapeutic Phlebotomy, Care After Refer to this sheet in the next few weeks. These instructions provide you with information about caring for yourself after your procedure. Your health care provider may also give you more specific instructions. Your treatment has been planned according to current medical practices, but problems sometimes occur. Call your health care provider if you have any problems or questions after your procedure. What can I expect after the procedure? After the procedure, it is common to have:  Light-headedness or dizziness. You may feel faint.  Nausea.  Tiredness. Follow these instructions at home: Activity  Return to your normal activities as directed by your health care provider. Most people can go back to their normal activities right away.  Avoid strenuous physical activity and heavy lifting or pulling for about 5 hours after the procedure. Do not lift anything that is heavier than 10 lb (4.5 kg).  Athletes should avoid strenuous exercise for at least 12 hours.  Change positions slowly for the remainder of the day. This will help to prevent light-headedness or fainting.  If you feel light-headed, lie down until the feeling goes away. Eating and drinking  Be sure to eat well-balanced meals for the next 24 hours.  Drink enough fluid to keep your urine clear or pale yellow.  Avoid drinking alcohol on the day that you had the procedure. Care of the Needle Insertion Site  Keep your bandage dry. You can remove the  bandage after about 5 hours or as directed by your health care provider.  If you have bleeding from the needle insertion site, elevate your arm and press firmly on the site until the bleeding stops.  If you have bruising at the site, apply ice to the area:  Put ice in a plastic bag.  Place a towel between your skin and the bag.  Leave the ice on for 20 minutes, 2-3 times a day for the first 24 hours.  If the swelling does not go away after 24 hours, apply a warm, moist washcloth to the area for 20 minutes, 2-3 times a day. General instructions  Avoid smoking for at least 30 minutes after the procedure.  Keep all follow-up visits as directed by your health care provider. It is important to continue with further therapeutic phlebotomy treatments as directed. Contact a health care provider if:  You have redness, swelling, or pain at the needle insertion site.  You have fluid, blood, or pus coming from the needle insertion site.  You feel light-headed, dizzy, or nauseated, and the feeling does not go away.  You notice new bruising at the needle insertion site.  You feel weaker than normal.  You have a fever or chills. Get help right away if:  You have severe nausea or vomiting.  You have chest pain.  You have trouble breathing. This information is not intended to replace advice given to you by your health care provider. Make sure you discuss any questions you have with your health care provider. Document Released: 04/14/2011 Document Revised: 07/12/2016 Document  Reviewed: 11/06/2014 Elsevier Interactive Patient Education  2017 [employer].

## 2016-12-19 ENCOUNTER — Ambulatory Visit: Payer: 59

## 2016-12-19 ENCOUNTER — Other Ambulatory Visit (HOSPITAL_BASED_OUTPATIENT_CLINIC_OR_DEPARTMENT_OTHER): Payer: 59

## 2016-12-19 DIAGNOSIS — D751 Secondary polycythemia: Secondary | ICD-10-CM | POA: Diagnosis not present

## 2016-12-19 LAB — CBC WITH DIFFERENTIAL/PLATELET
BASO%: 0.7 % (ref 0.0–2.0)
Basophils Absolute: 0.1 10*3/uL (ref 0.0–0.1)
EOS%: 2.3 % (ref 0.0–7.0)
Eosinophils Absolute: 0.3 10*3/uL (ref 0.0–0.5)
HEMATOCRIT: 50.1 % — AB (ref 38.4–49.9)
HGB: 16.8 g/dL (ref 13.0–17.1)
LYMPH#: 3.6 10*3/uL — AB (ref 0.9–3.3)
LYMPH%: 29.5 % (ref 14.0–49.0)
MCH: 30.3 pg (ref 27.2–33.4)
MCHC: 33.6 g/dL (ref 32.0–36.0)
MCV: 90.3 fL (ref 79.3–98.0)
MONO#: 1.1 10*3/uL — ABNORMAL HIGH (ref 0.1–0.9)
MONO%: 9.1 % (ref 0.0–14.0)
NEUT#: 7.2 10*3/uL — ABNORMAL HIGH (ref 1.5–6.5)
NEUT%: 58.4 % (ref 39.0–75.0)
Platelets: 208 10*3/uL (ref 140–400)
RBC: 5.54 10*6/uL (ref 4.20–5.82)
RDW: 13.7 % (ref 11.0–14.6)
WBC: 12.3 10*3/uL — AB (ref 4.0–10.3)

## 2016-12-19 NOTE — Progress Notes (Signed)
Patient Hgb of 16.8 out of parameter for therapeutic phlebotomy. Assessed patient in lobby and provided lab work. Patient denied any ill effects associated with Hgb level.   Wylene Simmer, BSN, RN 12/19/2016 3:50 PM

## 2017-01-16 ENCOUNTER — Ambulatory Visit (HOSPITAL_BASED_OUTPATIENT_CLINIC_OR_DEPARTMENT_OTHER): Payer: 59

## 2017-01-16 ENCOUNTER — Other Ambulatory Visit (HOSPITAL_BASED_OUTPATIENT_CLINIC_OR_DEPARTMENT_OTHER): Payer: 59

## 2017-01-16 VITALS — BP 118/78 | HR 85 | Temp 98.0°F | Resp 16

## 2017-01-16 DIAGNOSIS — D751 Secondary polycythemia: Secondary | ICD-10-CM | POA: Diagnosis not present

## 2017-01-16 LAB — CBC WITH DIFFERENTIAL/PLATELET
BASO%: 0.6 % (ref 0.0–2.0)
BASOS ABS: 0.1 10*3/uL (ref 0.0–0.1)
EOS ABS: 0.3 10*3/uL (ref 0.0–0.5)
EOS%: 2.5 % (ref 0.0–7.0)
HCT: 52 % — ABNORMAL HIGH (ref 38.4–49.9)
HGB: 17.3 g/dL — ABNORMAL HIGH (ref 13.0–17.1)
LYMPH%: 29.1 % (ref 14.0–49.0)
MCH: 30 pg (ref 27.2–33.4)
MCHC: 33.3 g/dL (ref 32.0–36.0)
MCV: 90.3 fL (ref 79.3–98.0)
MONO#: 0.8 10*3/uL (ref 0.1–0.9)
MONO%: 7.9 % (ref 0.0–14.0)
NEUT#: 6.3 10*3/uL (ref 1.5–6.5)
NEUT%: 59.9 % (ref 39.0–75.0)
NRBC: 0 % (ref 0–0)
PLATELETS: 193 10*3/uL (ref 140–400)
RBC: 5.76 10*6/uL (ref 4.20–5.82)
RDW: 14.6 % (ref 11.0–14.6)
WBC: 10.6 10*3/uL — ABNORMAL HIGH (ref 4.0–10.3)
lymph#: 3.1 10*3/uL (ref 0.9–3.3)

## 2017-01-16 NOTE — Patient Instructions (Signed)
     Therapeutic Phlebotomy, Care After Refer to this sheet in the next few weeks. These instructions provide you with information about caring for yourself after your procedure. Your health care provider may also give you more specific instructions. Your treatment has been planned according to current medical practices, but problems sometimes occur. Call your health care provider if you have any problems or questions after your procedure. What can I expect after the procedure? After the procedure, it is common to have:  Light-headedness or dizziness. You may feel faint.  Nausea.  Tiredness. Follow these instructions at home: Activity  Return to your normal activities as directed by your health care provider. Most people can go back to their normal activities right away.  Avoid strenuous physical activity and heavy lifting or pulling for about 5 hours after the procedure. Do not lift anything that is heavier than 10 lb (4.5 kg).  Athletes should avoid strenuous exercise for at least 12 hours.  Change positions slowly for the remainder of the day. This will help to prevent light-headedness or fainting.  If you feel light-headed, lie down until the feeling goes away. Eating and drinking  Be sure to eat well-balanced meals for the next 24 hours.  Drink enough fluid to keep your urine clear or pale yellow.  Avoid drinking alcohol on the day that you had the procedure. Care of the Needle Insertion Site  Keep your bandage dry. You can remove the bandage after about 5 hours or as directed by your health care provider.  If you have bleeding from the needle insertion site, elevate your arm and press firmly on the site until the bleeding stops.  If you have bruising at the site, apply ice to the area:  Put ice in a plastic bag.  Place a towel between your skin and the bag.  Leave the ice on for 20 minutes, 2-3 times a day for the first 24 hours.  If the swelling does not go away  after 24 hours, apply a warm, moist washcloth to the area for 20 minutes, 2-3 times a day. General instructions  Avoid smoking for at least 30 minutes after the procedure.  Keep all follow-up visits as directed by your health care provider. It is important to continue with further therapeutic phlebotomy treatments as directed. Contact a health care provider if:  You have redness, swelling, or pain at the needle insertion site.  You have fluid, blood, or pus coming from the needle insertion site.  You feel light-headed, dizzy, or nauseated, and the feeling does not go away.  You notice new bruising at the needle insertion site.  You feel weaker than normal.  You have a fever or chills. Get help right away if:  You have severe nausea or vomiting.  You have chest pain.  You have trouble breathing. This information is not intended to replace advice given to you by your health care provider. Make sure you discuss any questions you have with your health care provider. Document Released: 04/14/2011 Document Revised: 07/12/2016 Document Reviewed: 11/06/2014 Elsevier Interactive Patient Education  2017 [employer]  

## 2017-01-16 NOTE — Progress Notes (Signed)
VSS at discharge with no complaints/concerns.

## 2017-01-16 NOTE — Progress Notes (Signed)
Therapeutic phlebotomy performed. 1st attempt unsuccessful d/t clotted tubing. 2nd attempt with PIV. Patient unable to tolerate. States the site of the blood made him feel dizzy. Noted diaphoresis.  IV N/S bolus 250 mL initiated. Pt quickly recovered and 3rd attempt successful and phlebotomy completed. Patient states he feels "back to normal" after phlebotomy.

## 2017-02-12 ENCOUNTER — Ambulatory Visit (HOSPITAL_BASED_OUTPATIENT_CLINIC_OR_DEPARTMENT_OTHER): Payer: 59 | Admitting: Hematology and Oncology

## 2017-02-12 ENCOUNTER — Ambulatory Visit (HOSPITAL_BASED_OUTPATIENT_CLINIC_OR_DEPARTMENT_OTHER): Payer: 59

## 2017-02-12 ENCOUNTER — Other Ambulatory Visit (HOSPITAL_BASED_OUTPATIENT_CLINIC_OR_DEPARTMENT_OTHER): Payer: 59

## 2017-02-12 ENCOUNTER — Telehealth: Payer: Self-pay | Admitting: Hematology and Oncology

## 2017-02-12 VITALS — BP 140/78 | HR 85 | Temp 98.3°F | Resp 17 | Ht 73.0 in | Wt 235.0 lb

## 2017-02-12 VITALS — BP 110/83 | HR 94 | Temp 98.3°F | Resp 18

## 2017-02-12 DIAGNOSIS — F515 Nightmare disorder: Secondary | ICD-10-CM | POA: Diagnosis not present

## 2017-02-12 DIAGNOSIS — D751 Secondary polycythemia: Secondary | ICD-10-CM | POA: Diagnosis not present

## 2017-02-12 DIAGNOSIS — Z720 Tobacco use: Secondary | ICD-10-CM | POA: Diagnosis not present

## 2017-02-12 DIAGNOSIS — D45 Polycythemia vera: Secondary | ICD-10-CM

## 2017-02-12 LAB — CBC WITH DIFFERENTIAL/PLATELET
BASO%: 0.4 % (ref 0.0–2.0)
BASOS ABS: 0 10*3/uL (ref 0.0–0.1)
EOS ABS: 0.2 10*3/uL (ref 0.0–0.5)
EOS%: 1.6 % (ref 0.0–7.0)
HCT: 50.4 % — ABNORMAL HIGH (ref 38.4–49.9)
HGB: 17 g/dL (ref 13.0–17.1)
LYMPH%: 28.5 % (ref 14.0–49.0)
MCH: 29.8 pg (ref 27.2–33.4)
MCHC: 33.7 g/dL (ref 32.0–36.0)
MCV: 88.4 fL (ref 79.3–98.0)
MONO#: 1.1 10*3/uL — ABNORMAL HIGH (ref 0.1–0.9)
MONO%: 11.1 % (ref 0.0–14.0)
NEUT#: 5.7 10*3/uL (ref 1.5–6.5)
NEUT%: 58.4 % (ref 39.0–75.0)
PLATELETS: 164 10*3/uL (ref 140–400)
RBC: 5.7 10*6/uL (ref 4.20–5.82)
RDW: 14.5 % (ref 11.0–14.6)
WBC: 9.8 10*3/uL (ref 4.0–10.3)
lymph#: 2.8 10*3/uL (ref 0.9–3.3)
nRBC: 0 % (ref 0–0)

## 2017-02-12 MED ORDER — VARENICLINE TARTRATE 0.5 MG PO TABS: 0.5000 mg | ORAL_TABLET | Freq: Two times a day (BID) | ORAL | 11 refills | Status: DC

## 2017-02-12 NOTE — Assessment & Plan Note (Signed)
The patient has not quit smoking. 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 I recommend 1 unit of phlebotomy monthly to keep hemoglobin less than 16 g. He will continue aspirin therapy to reduce risk of blood clots. With recent changes in healthcare law, there is no contraindication for him to donate blood. I recommend he proceed to do blood donation every 8 weeks and to continue close follow-up with his primary care doctor for blood count monitoring. If for some reasons, he was refused blood donation, I will bring him back here for phlebotomy every month.

## 2017-02-12 NOTE — Telephone Encounter (Signed)
Gave patient AVS and calender per 02/12/2017 los.  

## 2017-02-12 NOTE — Assessment & Plan Note (Signed)
He had CT screening test in 2017 to exclude a lung cancer. He is highly motivated to quit smoking. The patient have good success with Chantix in the past, and would like to try again. He did have problem with sleep and nightmares. I warned him about risk of nausea and mood changes and depression with Chantix Ultimately, he is in agreement to try low-dose Chantix 0.5 mg twice a day. I sent the prescription to the pharmacy

## 2017-02-12 NOTE — Patient Instructions (Signed)
     Therapeutic Phlebotomy, Care After Refer to this sheet in the next few weeks. These instructions provide you with information about caring for yourself after your procedure. Your health care provider may also give you more specific instructions. Your treatment has been planned according to current medical practices, but problems sometimes occur. Call your health care provider if you have any problems or questions after your procedure. What can I expect after the procedure? After the procedure, it is common to have:  Light-headedness or dizziness. You may feel faint.  Nausea.  Tiredness. Follow these instructions at home: Activity  Return to your normal activities as directed by your health care provider. Most people can go back to their normal activities right away.  Avoid strenuous physical activity and heavy lifting or pulling for about 5 hours after the procedure. Do not lift anything that is heavier than 10 lb (4.5 kg).  Athletes should avoid strenuous exercise for at least 12 hours.  Change positions slowly for the remainder of the day. This will help to prevent light-headedness or fainting.  If you feel light-headed, lie down until the feeling goes away. Eating and drinking  Be sure to eat well-balanced meals for the next 24 hours.  Drink enough fluid to keep your urine clear or pale yellow.  Avoid drinking alcohol on the day that you had the procedure. Care of the Needle Insertion Site  Keep your bandage dry. You can remove the bandage after about 5 hours or as directed by your health care provider.  If you have bleeding from the needle insertion site, elevate your arm and press firmly on the site until the bleeding stops.  If you have bruising at the site, apply ice to the area:  Put ice in a plastic bag.  Place a towel between your skin and the bag.  Leave the ice on for 20 minutes, 2-3 times a day for the first 24 hours.  If the swelling does not go away  after 24 hours, apply a warm, moist washcloth to the area for 20 minutes, 2-3 times a day. General instructions  Avoid smoking for at least 30 minutes after the procedure.  Keep all follow-up visits as directed by your health care provider. It is important to continue with further therapeutic phlebotomy treatments as directed. Contact a health care provider if:  You have redness, swelling, or pain at the needle insertion site.  You have fluid, blood, or pus coming from the needle insertion site.  You feel light-headed, dizzy, or nauseated, and the feeling does not go away.  You notice new bruising at the needle insertion site.  You feel weaker than normal.  You have a fever or chills. Get help right away if:  You have severe nausea or vomiting.  You have chest pain.  You have trouble breathing. This information is not intended to replace advice given to you by your health care provider. Make sure you discuss any questions you have with your health care provider. Document Released: 04/14/2011 Document Revised: 07/12/2016 Document Reviewed: 11/06/2014 Elsevier Interactive Patient Education  2017 [employer]  

## 2017-02-12 NOTE — Progress Notes (Signed)
[facility] OFFICE PROGRESS NOTE  [name], MD SUMMARY OF HEMATOLOGIC HISTORY:  This is a patient seen by another hematologist in December 2013 for severe erythrocytosis with associated leukocytosis. Peripheral blood for JAK 2 mutation was negative. The patient was advised to stop smoking. He also underwent sleep study and was told he had mild obstructive sleep apnea but declined treatment for now. Since 2015 he had multiple phlebotomy sessions. CT lung screening in April 2017 was negative INTERVAL HISTORY: Chris Long 56 y.o. male returns for further follow-up. He continues to smoke slightly under 1 pack of cigarettes per day. He is motivated to quit smoking. He denies recent infection. He has no problem with phlebotomy.  Denies diagnosis of blood clots.  He takes aspirin on a regular basis  I have reviewed the past medical history, past surgical history, social history and family history with the patient and they are unchanged from previous note.  ALLERGIES:  has No Known Allergies.  MEDICATIONS:  Current Outpatient Prescriptions  Medication Sig Dispense Refill  . aspirin 325 MG tablet Take 325 mg by mouth daily.    Marland Kitchen ezetimibe (ZETIA) 10 MG tablet Take 10 mg by mouth daily.  3  . fish oil-omega-3 fatty acids 1000 MG capsule Take 2 g by mouth daily.    Marland Kitchen JARDIANCE 10 MG TABS tablet Take 10 mg by mouth daily.  6  . levothyroxine (SYNTHROID, LEVOTHROID) 88 MCG tablet Take 88 mcg by mouth daily.    Marland Kitchen lisinopril (PRINIVIL,ZESTRIL) 10 MG tablet Take 10 mg by mouth daily.  3  . metFORMIN (GLUCOPHAGE-XR) 500 MG 24 hr tablet Take 500 mg by mouth daily.  6  . metoprolol (LOPRESSOR) 50 MG tablet Take 0.5 tablets (25 mg total) by mouth 2 (two) times daily. 90 tablet 3  . Multiple Vitamin (MULTIVITAMIN) capsule Take 1 capsule by mouth daily.    . rosuvastatin (CRESTOR) 10 MG tablet Take 20 mg by mouth daily.     . varenicline (CHANTIX) 0.5 MG tablet Take 1 tablet  (0.5 mg total) by mouth 2 (two) times daily. 60 tablet 11   No current facility-administered medications for this visit.      REVIEW OF SYSTEMS:   Constitutional: Denies fevers, chills or night sweats Eyes: Denies blurriness of vision Ears, nose, mouth, throat, and face: Denies mucositis or sore throat Respiratory: Denies cough, dyspnea or wheezes Cardiovascular: Denies palpitation, chest discomfort or lower extremity swelling Gastrointestinal:  Denies nausea, heartburn or change in bowel habits Skin: Denies abnormal skin rashes Lymphatics: Denies new lymphadenopathy or easy bruising Neurological:Denies numbness, tingling or new weaknesses Behavioral/Psych: Mood is stable, no new changes  All other systems were reviewed with the patient and are negative.  PHYSICAL EXAMINATION: ECOG PERFORMANCE STATUS: 0 - Asymptomatic  Vitals:   02/12/17 1412  BP: 140/78  Pulse: 85  Resp: 17  Temp: 98.3 F (36.8 C)   Filed Weights   02/12/17 1412  Weight: 235 lb (106.6 kg)    GENERAL:alert, no distress and comfortable SKIN: skin color, texture, turgor are normal, no rashes or significant lesions.  Note that subcutaneous lipoma EYES: normal, Conjunctiva are pink and non-injected, sclera clear OROPHARYNX:no exudate, no erythema and lips, buccal mucosa, and tongue normal  NECK: supple, thyroid normal size, non-tender, without nodularity LYMPH:  no palpable lymphadenopathy in the cervical, axillary or inguinal LUNGS: clear to auscultation and percussion with normal breathing effort HEART: regular rate & rhythm and no murmurs and no lower extremity edema  ABDOMEN:abdomen soft, non-tender and normal bowel sounds Musculoskeletal:no cyanosis of digits and no clubbing  NEURO: alert & oriented x 3 with fluent speech, no focal motor/sensory deficits  LABORATORY DATA:  I have reviewed the data as listed     Component Value Date/Time   NA 139 09/13/2014 1315   K 4.7 09/13/2014 1315   CL 102  09/13/2014 1315   CO2 23 09/13/2014 1315   GLUCOSE 114 (H) 09/13/2014 1315   BUN 15 09/13/2014 1315   CREATININE 0.90 09/13/2014 1315   CALCIUM 9.7 09/13/2014 1315   PROT 7.2 09/13/2014 1315   ALBUMIN 3.9 09/13/2014 1315   AST 21 09/13/2014 1315   ALT 32 09/13/2014 1315   ALKPHOS 60 09/13/2014 1315   BILITOT 0.3 09/13/2014 1315   GFRNONAA >90 09/13/2014 1315   GFRAA >90 09/13/2014 1315    No results found for: SPEP, UPEP  Lab Results  Component Value Date   WBC 9.8 02/12/2017   NEUTROABS 5.7 02/12/2017   HGB 17.0 02/12/2017   HCT 50.4 (H) 02/12/2017   MCV 88.4 02/12/2017   PLT 164 02/12/2017      Chemistry      Component Value Date/Time   NA 139 09/13/2014 1315   K 4.7 09/13/2014 1315   CL 102 09/13/2014 1315   CO2 23 09/13/2014 1315   BUN 15 09/13/2014 1315   CREATININE 0.90 09/13/2014 1315      Component Value Date/Time   CALCIUM 9.7 09/13/2014 1315   ALKPHOS 60 09/13/2014 1315   AST 21 09/13/2014 1315   ALT 32 09/13/2014 1315   BILITOT 0.3 09/13/2014 1315       ASSESSMENT & PLAN:  Polycythemia The patient has not quit smoking. 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 I recommend 1 unit of phlebotomy monthly to keep hemoglobin less than 16 g. He will continue aspirin therapy to reduce risk of blood clots. With recent changes in healthcare law, there is no contraindication for him to donate blood. I recommend he proceed to do blood donation every 8 weeks and to continue close follow-up with his primary care doctor for blood count monitoring. If for some reasons, he was refused blood donation, I will bring him back here for phlebotomy every month.  Tobacco abuse He had CT screening test in 2017 to exclude a lung cancer. He is highly motivated to quit smoking. The patient have good success with Chantix in the past, and would like to try  again. He did have problem with sleep and nightmares. I warned him about risk of nausea and mood changes and depression with Chantix Ultimately, he is in agreement to try low-dose Chantix 0.5 mg twice a day. I sent the prescription to the pharmacy   No orders of the defined types were placed in this encounter.   All questions were answered. The patient knows to call the clinic with any problems, questions or concerns. No barriers to learning was detected.  I spent 15 minutes counseling the patient face to face. The total time spent in the appointment was 20 minutes and more than 50% was on counseling.     Heath Lark, MD 3/22/20182:36 PM

## 2017-03-23 ENCOUNTER — Other Ambulatory Visit (HOSPITAL_BASED_OUTPATIENT_CLINIC_OR_DEPARTMENT_OTHER): Payer: 59

## 2017-03-23 ENCOUNTER — Ambulatory Visit (HOSPITAL_BASED_OUTPATIENT_CLINIC_OR_DEPARTMENT_OTHER): Payer: 59

## 2017-03-23 VITALS — BP 136/83 | HR 82 | Temp 98.6°F | Resp 18

## 2017-03-23 DIAGNOSIS — D751 Secondary polycythemia: Secondary | ICD-10-CM | POA: Diagnosis not present

## 2017-03-23 LAB — CBC WITH DIFFERENTIAL/PLATELET
BASO%: 0.9 % (ref 0.0–2.0)
Basophils Absolute: 0.1 10*3/uL (ref 0.0–0.1)
EOS%: 2.3 % (ref 0.0–7.0)
Eosinophils Absolute: 0.2 10*3/uL (ref 0.0–0.5)
HCT: 48.9 % (ref 38.4–49.9)
HGB: 16.2 g/dL (ref 13.0–17.1)
LYMPH%: 32 % (ref 14.0–49.0)
MCH: 29.3 pg (ref 27.2–33.4)
MCHC: 33.1 g/dL (ref 32.0–36.0)
MCV: 88.7 fL (ref 79.3–98.0)
MONO#: 0.9 10*3/uL (ref 0.1–0.9)
MONO%: 10.4 % (ref 0.0–14.0)
NEUT%: 54.4 % (ref 39.0–75.0)
NEUTROS ABS: 4.9 10*3/uL (ref 1.5–6.5)
PLATELETS: 212 10*3/uL (ref 140–400)
RBC: 5.51 10*6/uL (ref 4.20–5.82)
RDW: 14.1 % (ref 11.0–14.6)
WBC: 9 10*3/uL (ref 4.0–10.3)
lymph#: 2.9 10*3/uL (ref 0.9–3.3)

## 2017-03-23 NOTE — Progress Notes (Signed)
Phlebotomy. 500 gm pulled from patient with 16g PIV to left AC. Procedure started at 1550and ended at 1557. Patient given snack and encouraged fluids. Observed for 30 minutes afterwards. Vital Signs stable.

## 2017-04-23 ENCOUNTER — Other Ambulatory Visit (HOSPITAL_BASED_OUTPATIENT_CLINIC_OR_DEPARTMENT_OTHER): Payer: 59

## 2017-04-23 ENCOUNTER — Ambulatory Visit: Payer: 59

## 2017-04-23 DIAGNOSIS — D751 Secondary polycythemia: Secondary | ICD-10-CM | POA: Diagnosis not present

## 2017-04-23 LAB — CBC WITH DIFFERENTIAL/PLATELET
BASO%: 0.6 % (ref 0.0–2.0)
BASOS ABS: 0.1 10*3/uL (ref 0.0–0.1)
EOS%: 2.2 % (ref 0.0–7.0)
Eosinophils Absolute: 0.3 10*3/uL (ref 0.0–0.5)
HEMATOCRIT: 47.3 % (ref 38.4–49.9)
HGB: 15.8 g/dL (ref 13.0–17.1)
LYMPH%: 29.4 % (ref 14.0–49.0)
MCH: 29.1 pg (ref 27.2–33.4)
MCHC: 33.5 g/dL (ref 32.0–36.0)
MCV: 87 fL (ref 79.3–98.0)
MONO#: 1.1 10*3/uL — ABNORMAL HIGH (ref 0.1–0.9)
MONO%: 9.6 % (ref 0.0–14.0)
NEUT#: 7 10*3/uL — ABNORMAL HIGH (ref 1.5–6.5)
NEUT%: 58.2 % (ref 39.0–75.0)
Platelets: 217 10*3/uL (ref 140–400)
RBC: 5.44 10*6/uL (ref 4.20–5.82)
RDW: 14.4 % (ref 11.0–14.6)
WBC: 12 10*3/uL — ABNORMAL HIGH (ref 4.0–10.3)
lymph#: 3.5 10*3/uL — ABNORMAL HIGH (ref 0.9–3.3)

## 2017-04-23 NOTE — Progress Notes (Signed)
SW pt in lobby regarding labs.  Hgb today 15.8.  Per office note, no phlebotomy needed if Hgb less than 16.  Pt has no complaints/concerns.  Pt feeling well today.  Copy of schedule given.  Pt discharged without phlebotomy.  Pt verbalized understanding to all.

## 2017-04-27 DIAGNOSIS — F17210 Nicotine dependence, cigarettes, uncomplicated: Secondary | ICD-10-CM | POA: Diagnosis not present

## 2017-04-27 DIAGNOSIS — I130 Hypertensive heart and chronic kidney disease with heart failure and stage 1 through stage 4 chronic kidney disease, or unspecified chronic kidney disease: Secondary | ICD-10-CM | POA: Diagnosis not present

## 2017-04-27 DIAGNOSIS — D751 Secondary polycythemia: Secondary | ICD-10-CM | POA: Diagnosis not present

## 2017-04-27 DIAGNOSIS — E1122 Type 2 diabetes mellitus with diabetic chronic kidney disease: Secondary | ICD-10-CM | POA: Diagnosis not present

## 2017-04-28 DIAGNOSIS — E1136 Type 2 diabetes mellitus with diabetic cataract: Secondary | ICD-10-CM | POA: Diagnosis not present

## 2017-04-28 DIAGNOSIS — H2511 Age-related nuclear cataract, right eye: Secondary | ICD-10-CM | POA: Diagnosis not present

## 2017-04-28 DIAGNOSIS — D3132 Benign neoplasm of left choroid: Secondary | ICD-10-CM | POA: Diagnosis not present

## 2017-04-28 DIAGNOSIS — H0289 Other specified disorders of eyelid: Secondary | ICD-10-CM | POA: Diagnosis not present

## 2017-04-28 DIAGNOSIS — Z79899 Other long term (current) drug therapy: Secondary | ICD-10-CM | POA: Diagnosis not present

## 2017-04-28 DIAGNOSIS — H25812 Combined forms of age-related cataract, left eye: Secondary | ICD-10-CM | POA: Diagnosis not present

## 2017-04-28 DIAGNOSIS — Z794 Long term (current) use of insulin: Secondary | ICD-10-CM | POA: Diagnosis not present

## 2017-04-28 DIAGNOSIS — I10 Essential (primary) hypertension: Secondary | ICD-10-CM | POA: Diagnosis not present

## 2017-04-28 DIAGNOSIS — Z7982 Long term (current) use of aspirin: Secondary | ICD-10-CM | POA: Diagnosis not present

## 2017-04-28 DIAGNOSIS — F17200 Nicotine dependence, unspecified, uncomplicated: Secondary | ICD-10-CM | POA: Diagnosis not present

## 2017-05-22 ENCOUNTER — Other Ambulatory Visit: Payer: 59

## 2017-06-17 ENCOUNTER — Telehealth: Payer: Self-pay | Admitting: Hematology and Oncology

## 2017-06-17 NOTE — Telephone Encounter (Signed)
Pt called to r/s lab/phlebotomy appt to 8/1 at 0800

## 2017-06-19 ENCOUNTER — Other Ambulatory Visit: Payer: 59

## 2017-06-24 ENCOUNTER — Ambulatory Visit (HOSPITAL_BASED_OUTPATIENT_CLINIC_OR_DEPARTMENT_OTHER): Payer: BLUE CROSS/BLUE SHIELD

## 2017-06-24 ENCOUNTER — Other Ambulatory Visit (HOSPITAL_BASED_OUTPATIENT_CLINIC_OR_DEPARTMENT_OTHER): Payer: BLUE CROSS/BLUE SHIELD

## 2017-06-24 VITALS — BP 114/80 | HR 76 | Temp 98.5°F | Resp 16

## 2017-06-24 DIAGNOSIS — D751 Secondary polycythemia: Secondary | ICD-10-CM

## 2017-06-24 LAB — CBC WITH DIFFERENTIAL/PLATELET
BASO%: 0.4 % (ref 0.0–2.0)
Basophils Absolute: 0.1 10*3/uL (ref 0.0–0.1)
EOS%: 1.6 % (ref 0.0–7.0)
Eosinophils Absolute: 0.2 10*3/uL (ref 0.0–0.5)
HCT: 51 % — ABNORMAL HIGH (ref 38.4–49.9)
HEMOGLOBIN: 16.6 g/dL (ref 13.0–17.1)
LYMPH#: 2.8 10*3/uL (ref 0.9–3.3)
LYMPH%: 24.5 % (ref 14.0–49.0)
MCH: 28.9 pg (ref 27.2–33.4)
MCHC: 32.5 g/dL (ref 32.0–36.0)
MCV: 88.9 fL (ref 79.3–98.0)
MONO#: 0.9 10*3/uL (ref 0.1–0.9)
MONO%: 8.3 % (ref 0.0–14.0)
NEUT%: 65.2 % (ref 39.0–75.0)
NEUTROS ABS: 7.4 10*3/uL — AB (ref 1.5–6.5)
Platelets: 194 10*3/uL (ref 140–400)
RBC: 5.74 10*6/uL (ref 4.20–5.82)
RDW: 15.6 % — AB (ref 11.0–14.6)
WBC: 11.4 10*3/uL — AB (ref 4.0–10.3)

## 2017-06-24 NOTE — Patient Instructions (Signed)
Therapeutic Phlebotomy Therapeutic phlebotomy is the controlled removal of blood from a person's body for the purpose of treating a medical condition. The procedure is similar to donating blood. Usually, about a pint (470 mL, or 0.47L) of blood is removed. The average adult has 9-12 pints (4.3-5.7 L) of blood. Therapeutic phlebotomy may be used to treat the following medical conditions:  Hemochromatosis. This is a condition in which the blood contains too much iron.  Polycythemia vera. This is a condition in which the blood contains too many red blood cells.  Porphyria cutanea tarda. This is a disease in which an important part of hemoglobin is not made properly. It results in the buildup of abnormal amounts of porphyrins in the body.  Sickle cell disease. This is a condition in which the red blood cells form an abnormal crescent shape rather than a round shape.  Tell a health care provider about:  Any allergies you have.  All medicines you are taking, including vitamins, herbs, eye drops, creams, and over-the-counter medicines.  Any problems you or family members have had with anesthetic medicines.  Any blood disorders you have.  Any surgeries you have had.  Any medical conditions you have. What are the risks? Generally, this is a safe procedure. However, problems may occur, including:  Nausea or light-headedness.  Low blood pressure.  Soreness, bleeding, swelling, or bruising at the needle insertion site.  Infection.  What happens before the procedure?  Follow instructions from your health care provider about eating or drinking restrictions.  Ask your health care provider about changing or stopping your regular medicines. This is especially important if you are taking diabetes medicines or blood thinners.  Wear clothing with sleeves that can be raised above the elbow.  Plan to have someone take you home after the procedure.  You may have a blood sample taken. What  happens during the procedure?  A needle will be inserted into one of your veins.  Tubing and a collection bag will be attached to that needle.  Blood will flow through the needle and tubing into the collection bag.  You may be asked to open and close your hand slowly and continually during the entire collection.  After the specified amount of blood has been removed from your body, the collection bag and tubing will be clamped.  The needle will be removed from your vein.  Pressure will be held on the site of the needle insertion to stop the bleeding.  A bandage (dressing) will be placed over the needle insertion site. The procedure may vary among health care providers and hospitals. What happens after the procedure?  Your recovery will be assessed and monitored.  You can return to your normal activities as directed by your health care provider. This information is not intended to replace advice given to you by your health care provider. Make sure you discuss any questions you have with your health care provider. Document Released: 04/14/2011 Document Revised: 07/12/2016 Document Reviewed: 11/06/2014 Elsevier Interactive Patient Education  2018 [employer]  

## 2017-06-24 NOTE — Progress Notes (Signed)
Pt phlebotomized in left AC with 16 gauge needle removing 516 grams in approx 6 minutes.  No complications.  Pt reports eating and drinking p/t arrival.  Pt observed for 30 minutes and given beverage.  VSS.  Pt discharged with no complications

## 2017-07-24 ENCOUNTER — Other Ambulatory Visit (HOSPITAL_BASED_OUTPATIENT_CLINIC_OR_DEPARTMENT_OTHER): Payer: BLUE CROSS/BLUE SHIELD

## 2017-07-24 ENCOUNTER — Ambulatory Visit (HOSPITAL_BASED_OUTPATIENT_CLINIC_OR_DEPARTMENT_OTHER): Payer: BLUE CROSS/BLUE SHIELD

## 2017-07-24 VITALS — BP 110/79 | HR 95 | Temp 98.9°F | Resp 16

## 2017-07-24 DIAGNOSIS — D751 Secondary polycythemia: Secondary | ICD-10-CM

## 2017-07-24 LAB — CBC WITH DIFFERENTIAL/PLATELET
BASO%: 0.7 % (ref 0.0–2.0)
Basophils Absolute: 0.1 10*3/uL (ref 0.0–0.1)
EOS ABS: 0.2 10*3/uL (ref 0.0–0.5)
EOS%: 2 % (ref 0.0–7.0)
HEMATOCRIT: 49 % (ref 38.4–49.9)
HGB: 16 g/dL (ref 13.0–17.1)
LYMPH#: 3 10*3/uL (ref 0.9–3.3)
LYMPH%: 28.4 % (ref 14.0–49.0)
MCH: 28.4 pg (ref 27.2–33.4)
MCHC: 32.6 g/dL (ref 32.0–36.0)
MCV: 87 fL (ref 79.3–98.0)
MONO#: 0.9 10*3/uL (ref 0.1–0.9)
MONO%: 8.1 % (ref 0.0–14.0)
NEUT#: 6.4 10*3/uL (ref 1.5–6.5)
NEUT%: 60.8 % (ref 39.0–75.0)
PLATELETS: 196 10*3/uL (ref 140–400)
RBC: 5.63 10*6/uL (ref 4.20–5.82)
RDW: 15.8 % — ABNORMAL HIGH (ref 11.0–14.6)
WBC: 10.6 10*3/uL — ABNORMAL HIGH (ref 4.0–10.3)

## 2017-07-24 NOTE — Progress Notes (Signed)
Phlebotomy preformed via left AC over approximately 5 minutes.  500gm removed using 16g phleb kit.  Pt tolerated well. Drink provided.  Pt observed 30 minutes post procedure.

## 2017-07-24 NOTE — Patient Instructions (Signed)

## 2017-07-31 DIAGNOSIS — E038 Other specified hypothyroidism: Secondary | ICD-10-CM | POA: Diagnosis not present

## 2017-07-31 DIAGNOSIS — R8299 Other abnormal findings in urine: Secondary | ICD-10-CM | POA: Diagnosis not present

## 2017-07-31 DIAGNOSIS — Z0000 Encounter for general adult medical examination without abnormal findings: Secondary | ICD-10-CM | POA: Diagnosis not present

## 2017-07-31 DIAGNOSIS — Z125 Encounter for screening for malignant neoplasm of prostate: Secondary | ICD-10-CM | POA: Diagnosis not present

## 2017-07-31 DIAGNOSIS — N390 Urinary tract infection, site not specified: Secondary | ICD-10-CM | POA: Diagnosis not present

## 2017-07-31 DIAGNOSIS — E1122 Type 2 diabetes mellitus with diabetic chronic kidney disease: Secondary | ICD-10-CM | POA: Diagnosis not present

## 2017-08-07 DIAGNOSIS — Z125 Encounter for screening for malignant neoplasm of prostate: Secondary | ICD-10-CM | POA: Diagnosis not present

## 2017-08-07 DIAGNOSIS — E1122 Type 2 diabetes mellitus with diabetic chronic kidney disease: Secondary | ICD-10-CM | POA: Diagnosis not present

## 2017-08-07 DIAGNOSIS — J449 Chronic obstructive pulmonary disease, unspecified: Secondary | ICD-10-CM | POA: Diagnosis not present

## 2017-08-07 DIAGNOSIS — Z1389 Encounter for screening for other disorder: Secondary | ICD-10-CM | POA: Diagnosis not present

## 2017-08-07 DIAGNOSIS — Z0000 Encounter for general adult medical examination without abnormal findings: Secondary | ICD-10-CM | POA: Diagnosis not present

## 2017-08-07 DIAGNOSIS — D3130 Benign neoplasm of unspecified choroid: Secondary | ICD-10-CM | POA: Diagnosis not present

## 2017-08-07 DIAGNOSIS — Z23 Encounter for immunization: Secondary | ICD-10-CM | POA: Diagnosis not present

## 2017-08-07 DIAGNOSIS — F17210 Nicotine dependence, cigarettes, uncomplicated: Secondary | ICD-10-CM | POA: Diagnosis not present

## 2017-08-14 DIAGNOSIS — Z1212 Encounter for screening for malignant neoplasm of rectum: Secondary | ICD-10-CM | POA: Diagnosis not present

## 2017-08-20 ENCOUNTER — Ambulatory Visit (HOSPITAL_BASED_OUTPATIENT_CLINIC_OR_DEPARTMENT_OTHER): Payer: BLUE CROSS/BLUE SHIELD | Admitting: Hematology and Oncology

## 2017-08-20 ENCOUNTER — Telehealth: Payer: Self-pay | Admitting: Hematology and Oncology

## 2017-08-20 ENCOUNTER — Ambulatory Visit (HOSPITAL_BASED_OUTPATIENT_CLINIC_OR_DEPARTMENT_OTHER): Payer: BLUE CROSS/BLUE SHIELD

## 2017-08-20 ENCOUNTER — Other Ambulatory Visit (HOSPITAL_BASED_OUTPATIENT_CLINIC_OR_DEPARTMENT_OTHER): Payer: BLUE CROSS/BLUE SHIELD

## 2017-08-20 VITALS — BP 110/82 | HR 82 | Temp 98.3°F | Resp 18

## 2017-08-20 VITALS — BP 134/85 | HR 95 | Temp 98.3°F | Resp 18 | Ht 73.0 in | Wt 240.5 lb

## 2017-08-20 DIAGNOSIS — D751 Secondary polycythemia: Secondary | ICD-10-CM | POA: Diagnosis not present

## 2017-08-20 DIAGNOSIS — Z720 Tobacco use: Secondary | ICD-10-CM

## 2017-08-20 LAB — CBC WITH DIFFERENTIAL/PLATELET
BASO%: 0.8 % (ref 0.0–2.0)
BASOS ABS: 0.1 10*3/uL (ref 0.0–0.1)
EOS ABS: 0.2 10*3/uL (ref 0.0–0.5)
EOS%: 1.9 % (ref 0.0–7.0)
HEMATOCRIT: 46.8 % (ref 38.4–49.9)
HEMOGLOBIN: 15.6 g/dL (ref 13.0–17.1)
LYMPH%: 24.7 % (ref 14.0–49.0)
MCH: 28.8 pg (ref 27.2–33.4)
MCHC: 33.2 g/dL (ref 32.0–36.0)
MCV: 86.8 fL (ref 79.3–98.0)
MONO#: 1.2 10*3/uL — ABNORMAL HIGH (ref 0.1–0.9)
MONO%: 10.4 % (ref 0.0–14.0)
NEUT%: 62.2 % (ref 39.0–75.0)
NEUTROS ABS: 7.3 10*3/uL — AB (ref 1.5–6.5)
Platelets: 198 10*3/uL (ref 140–400)
RBC: 5.4 10*6/uL (ref 4.20–5.82)
RDW: 14.9 % — AB (ref 11.0–14.6)
WBC: 11.7 10*3/uL — AB (ref 4.0–10.3)
lymph#: 2.9 10*3/uL (ref 0.9–3.3)

## 2017-08-20 NOTE — Patient Instructions (Signed)

## 2017-08-20 NOTE — Progress Notes (Signed)
OK to do phlebotomy today despite labs per Dr Alvy Bimler.  Order repeated & verified.  Jesse Fall RN

## 2017-08-20 NOTE — Telephone Encounter (Signed)
Gave avs and calendar for November - July 2019

## 2017-08-20 NOTE — Progress Notes (Signed)
Phlebotomy started at 1510 using gauge 16 phleb kit to left AC. Phlebotomy ended at 1516 with 523 gms removed. Snacks and drinks provided. Pt monitored for 30 minutes.  Pt's post phlebotomy vital signs stable and no complaints.

## 2017-08-21 ENCOUNTER — Encounter: Payer: Self-pay | Admitting: Hematology and Oncology

## 2017-08-21 NOTE — Assessment & Plan Note (Signed)
The patient has not quit smoking. 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 I recommend 1 unit of phlebotomy monthly to keep hemoglobin less than 16 g. He will continue aspirin therapy to reduce risk of blood clots. I plan to space out his  phlebotomy to every 6 weeks

## 2017-08-21 NOTE — Progress Notes (Signed)
[facility] OFFICE PROGRESS NOTE  Chris Baton, MD SUMMARY OF HEMATOLOGIC HISTORY:  This is a patient seen by another hematologist in December 2013 for severe erythrocytosis with associated leukocytosis. Peripheral blood for JAK 2 mutation was negative. The patient was advised to stop smoking. He also underwent sleep study and was told he had mild obstructive sleep apnea but declined treatment for now. Since 2015 he had multiple phlebotomy sessions. CT lung screening in April 2017 was negative INTERVAL HISTORY: Chris Long 56 y.o. male returns for further follow-up. He continues to smoke up to a pack of cigarettes per day Denies recent infection He tolerated phlebotomy well  I have reviewed the past medical history, past surgical history, social history and family history with the patient and they are unchanged from previous note.  ALLERGIES:  has No Known Allergies.  MEDICATIONS:  Current Outpatient Prescriptions  Medication Sig Dispense Refill  . aspirin 325 MG tablet Take 325 mg by mouth daily.    Marland Kitchen ezetimibe (ZETIA) 10 MG tablet Take 10 mg by mouth daily.  3  . fish oil-omega-3 fatty acids 1000 MG capsule Take 2 g by mouth daily.    Marland Kitchen JARDIANCE 10 MG TABS tablet Take 10 mg by mouth daily.  6  . levothyroxine (SYNTHROID, LEVOTHROID) 88 MCG tablet Take 88 mcg by mouth daily.    Marland Kitchen lisinopril (PRINIVIL,ZESTRIL) 10 MG tablet Take 10 mg by mouth daily.  3  . metFORMIN (GLUCOPHAGE-XR) 500 MG 24 hr tablet Take 500 mg by mouth daily.  6  . metoprolol (LOPRESSOR) 50 MG tablet Take 0.5 tablets (25 mg total) by mouth 2 (two) times daily. 90 tablet 3  . Multiple Vitamin (MULTIVITAMIN) capsule Take 1 capsule by mouth daily.    . rosuvastatin (CRESTOR) 10 MG tablet Take 20 mg by mouth daily.     . varenicline (CHANTIX) 0.5 MG tablet Take 1 tablet (0.5 mg total) by mouth 2 (two) times daily. 60 tablet 11   No current facility-administered medications for this visit.       REVIEW OF SYSTEMS:   Constitutional: Denies fevers, chills or night sweats Eyes: Denies blurriness of vision Ears, nose, mouth, throat, and face: Denies mucositis or sore throat Respiratory: Denies cough, dyspnea or wheezes Cardiovascular: Denies palpitation, chest discomfort or lower extremity swelling Gastrointestinal:  Denies nausea, heartburn or change in bowel habits Skin: Denies abnormal skin rashes Lymphatics: Denies new lymphadenopathy or easy bruising Neurological:Denies numbness, tingling or new weaknesses Behavioral/Psych: Mood is stable, no new changes  All other systems were reviewed with the patient and are negative.  PHYSICAL EXAMINATION: ECOG PERFORMANCE STATUS: 0 - Asymptomatic  Vitals:   08/20/17 1330  BP: 134/85  Pulse: 95  Resp: 18  Temp: 98.3 F (36.8 C)  SpO2: 97%   Filed Weights   08/20/17 1330  Weight: 240 lb 8 oz (109.1 kg)    GENERAL:alert, no distress and comfortable SKIN: skin color, texture, turgor are normal, no rashes or significant lesions EYES: normal, Conjunctiva are pink and non-injected, sclera clear OROPHARYNX:no exudate, no erythema and lips, buccal mucosa, and tongue normal  NECK: supple, thyroid normal size, non-tender, without nodularity LYMPH:  no palpable lymphadenopathy in the cervical, axillary or inguinal LUNGS: clear to auscultation and percussion with normal breathing effort HEART: regular rate & rhythm and no murmurs and no lower extremity edema ABDOMEN:abdomen soft, non-tender and normal bowel sounds Musculoskeletal:no cyanosis of digits and no clubbing  NEURO: alert & oriented x 3 with fluent  speech, no focal motor/sensory deficits  LABORATORY DATA:  I have reviewed the data as listed     Component Value Date/Time   NA 139 09/13/2014 1315   K 4.7 09/13/2014 1315   CL 102 09/13/2014 1315   CO2 23 09/13/2014 1315   GLUCOSE 114 (H) 09/13/2014 1315   BUN 15 09/13/2014 1315   CREATININE 0.90 09/13/2014 1315    CALCIUM 9.7 09/13/2014 1315   PROT 7.2 09/13/2014 1315   ALBUMIN 3.9 09/13/2014 1315   AST 21 09/13/2014 1315   ALT 32 09/13/2014 1315   ALKPHOS 60 09/13/2014 1315   BILITOT 0.3 09/13/2014 1315   GFRNONAA >90 09/13/2014 1315   GFRAA >90 09/13/2014 1315    No results found for: SPEP, UPEP  Lab Results  Component Value Date   WBC 11.7 (H) 08/20/2017   NEUTROABS 7.3 (H) 08/20/2017   HGB 15.6 08/20/2017   HCT 46.8 08/20/2017   MCV 86.8 08/20/2017   PLT 198 08/20/2017      Chemistry      Component Value Date/Time   NA 139 09/13/2014 1315   K 4.7 09/13/2014 1315   CL 102 09/13/2014 1315   CO2 23 09/13/2014 1315   BUN 15 09/13/2014 1315   CREATININE 0.90 09/13/2014 1315      Component Value Date/Time   CALCIUM 9.7 09/13/2014 1315   ALKPHOS 60 09/13/2014 1315   AST 21 09/13/2014 1315   ALT 32 09/13/2014 1315   BILITOT 0.3 09/13/2014 1315     ASSESSMENT & PLAN:  Polycythemia The patient has not quit smoking. 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 I recommend 1 unit of phlebotomy monthly to keep hemoglobin less than 16 g. He will continue aspirin therapy to reduce risk of blood clots. I plan to space out his  phlebotomy to every 6 weeks  Tobacco abuse We discussed various strategies to help him quit smoking The patient has made a commitment to quit before I see him back next year   Orders Placed This Encounter  Procedures  . CBC with Differential/Platelet    Standing Status:   Standing    Number of Occurrences:   22    Standing Expiration Date:   08/20/2018  . Ferritin    Standing Status:   Future    Standing Expiration Date:   08/20/2018    All questions were answered. The patient knows to call the clinic with any problems, questions or concerns. No barriers to learning was detected.  I spent 10 minutes counseling the patient face to face. The  total time spent in the appointment was 15 minutes and more than 50% was on counseling.     Heath Lark, MD 9/28/201810:54 AM

## 2017-08-21 NOTE — Assessment & Plan Note (Signed)
We discussed various strategies to help him quit smoking The patient has made a commitment to quit before I see him back next year

## 2017-10-01 ENCOUNTER — Other Ambulatory Visit: Payer: BLUE CROSS/BLUE SHIELD

## 2017-10-13 ENCOUNTER — Other Ambulatory Visit (HOSPITAL_BASED_OUTPATIENT_CLINIC_OR_DEPARTMENT_OTHER): Payer: BLUE CROSS/BLUE SHIELD

## 2017-10-13 ENCOUNTER — Ambulatory Visit (HOSPITAL_BASED_OUTPATIENT_CLINIC_OR_DEPARTMENT_OTHER): Payer: BLUE CROSS/BLUE SHIELD

## 2017-10-13 VITALS — BP 123/74 | HR 84 | Temp 98.8°F | Resp 16

## 2017-10-13 DIAGNOSIS — D751 Secondary polycythemia: Secondary | ICD-10-CM | POA: Diagnosis not present

## 2017-10-13 LAB — CBC WITH DIFFERENTIAL/PLATELET
BASO%: 0.4 % (ref 0.0–2.0)
Basophils Absolute: 0 10*3/uL (ref 0.0–0.1)
EOS%: 1.7 % (ref 0.0–7.0)
Eosinophils Absolute: 0.2 10*3/uL (ref 0.0–0.5)
HEMATOCRIT: 50 % — AB (ref 38.4–49.9)
HEMOGLOBIN: 16 g/dL (ref 13.0–17.1)
LYMPH#: 2.5 10*3/uL (ref 0.9–3.3)
LYMPH%: 26.7 % (ref 14.0–49.0)
MCH: 28.1 pg (ref 27.2–33.4)
MCHC: 32 g/dL (ref 32.0–36.0)
MCV: 87.9 fL (ref 79.3–98.0)
MONO#: 1 10*3/uL — AB (ref 0.1–0.9)
MONO%: 10.4 % (ref 0.0–14.0)
NEUT%: 60.8 % (ref 39.0–75.0)
NEUTROS ABS: 5.7 10*3/uL (ref 1.5–6.5)
PLATELETS: 201 10*3/uL (ref 140–400)
RBC: 5.69 10*6/uL (ref 4.20–5.82)
RDW: 14.5 % (ref 11.0–14.6)
WBC: 9.3 10*3/uL (ref 4.0–10.3)

## 2017-10-13 LAB — FERRITIN: FERRITIN: 9 ng/mL — AB (ref 22–316)

## 2017-10-13 NOTE — Progress Notes (Signed)
Therapeutic phlebotomy preformed via left AC over approximately 5 minutes.  500gm removed.  Pt tolerated well.  Drink provided after.  VSS.  30 min obs.

## 2017-10-13 NOTE — Patient Instructions (Signed)

## 2017-11-12 ENCOUNTER — Other Ambulatory Visit: Payer: BLUE CROSS/BLUE SHIELD

## 2017-11-25 ENCOUNTER — Ambulatory Visit (HOSPITAL_BASED_OUTPATIENT_CLINIC_OR_DEPARTMENT_OTHER): Payer: BLUE CROSS/BLUE SHIELD

## 2017-11-25 ENCOUNTER — Other Ambulatory Visit (HOSPITAL_BASED_OUTPATIENT_CLINIC_OR_DEPARTMENT_OTHER): Payer: BLUE CROSS/BLUE SHIELD

## 2017-11-25 VITALS — BP 126/74 | HR 90 | Temp 97.9°F | Resp 18

## 2017-11-25 DIAGNOSIS — D751 Secondary polycythemia: Secondary | ICD-10-CM

## 2017-11-25 LAB — CBC WITH DIFFERENTIAL/PLATELET
BASO%: 0.8 % (ref 0.0–2.0)
BASOS ABS: 0.1 10*3/uL (ref 0.0–0.1)
EOS ABS: 0.1 10*3/uL (ref 0.0–0.5)
EOS%: 1.5 % (ref 0.0–7.0)
HEMATOCRIT: 47.9 % (ref 38.4–49.9)
HEMOGLOBIN: 15.8 g/dL (ref 13.0–17.1)
LYMPH#: 2.7 10*3/uL (ref 0.9–3.3)
LYMPH%: 28.1 % (ref 14.0–49.0)
MCH: 27.2 pg (ref 27.2–33.4)
MCHC: 33.1 g/dL (ref 32.0–36.0)
MCV: 82.2 fL (ref 79.3–98.0)
MONO#: 1 10*3/uL — ABNORMAL HIGH (ref 0.1–0.9)
MONO%: 10.6 % (ref 0.0–14.0)
NEUT%: 59 % (ref 39.0–75.0)
NEUTROS ABS: 5.7 10*3/uL (ref 1.5–6.5)
Platelets: 182 10*3/uL (ref 140–400)
RBC: 5.83 10*6/uL — ABNORMAL HIGH (ref 4.20–5.82)
RDW: 15.7 % — AB (ref 11.0–14.6)
WBC: 9.7 10*3/uL (ref 4.0–10.3)

## 2017-11-25 NOTE — Progress Notes (Signed)
Chris Long presents today for phlebotomy per MD orders. RAC accessed with 16G needle. Phlebotomy procedure started at 0906and ended at 0913. 501 grams removed. Patient tolerated procedure well. IV needle removed intact. Offered drink and snack, pt declined. Patient observed for 30 minutes after procedure without any incident.

## 2017-11-25 NOTE — Patient Instructions (Signed)

## 2017-12-21 DIAGNOSIS — I130 Hypertensive heart and chronic kidney disease with heart failure and stage 1 through stage 4 chronic kidney disease, or unspecified chronic kidney disease: Secondary | ICD-10-CM | POA: Diagnosis not present

## 2017-12-21 DIAGNOSIS — E1122 Type 2 diabetes mellitus with diabetic chronic kidney disease: Secondary | ICD-10-CM | POA: Diagnosis not present

## 2017-12-21 DIAGNOSIS — M65332 Trigger finger, left middle finger: Secondary | ICD-10-CM | POA: Diagnosis not present

## 2017-12-21 DIAGNOSIS — N182 Chronic kidney disease, stage 2 (mild): Secondary | ICD-10-CM | POA: Diagnosis not present

## 2017-12-24 ENCOUNTER — Other Ambulatory Visit: Payer: BLUE CROSS/BLUE SHIELD

## 2017-12-24 DIAGNOSIS — M65332 Trigger finger, left middle finger: Secondary | ICD-10-CM | POA: Diagnosis not present

## 2017-12-25 DIAGNOSIS — H25043 Posterior subcapsular polar age-related cataract, bilateral: Secondary | ICD-10-CM | POA: Diagnosis not present

## 2017-12-25 DIAGNOSIS — H5213 Myopia, bilateral: Secondary | ICD-10-CM | POA: Diagnosis not present

## 2018-01-06 ENCOUNTER — Inpatient Hospital Stay: Payer: BLUE CROSS/BLUE SHIELD

## 2018-01-06 ENCOUNTER — Inpatient Hospital Stay: Payer: BLUE CROSS/BLUE SHIELD | Attending: Hematology and Oncology

## 2018-01-06 DIAGNOSIS — D751 Secondary polycythemia: Secondary | ICD-10-CM | POA: Insufficient documentation

## 2018-01-06 LAB — CBC WITH DIFFERENTIAL/PLATELET
Basophils Absolute: 0 10*3/uL (ref 0.0–0.1)
Basophils Relative: 0 %
EOS ABS: 0.1 10*3/uL (ref 0.0–0.5)
EOS PCT: 2 %
HCT: 47.1 % (ref 38.4–49.9)
HEMOGLOBIN: 15 g/dL (ref 13.0–17.1)
LYMPHS ABS: 2.4 10*3/uL (ref 0.9–3.3)
Lymphocytes Relative: 27 %
MCH: 26.8 pg — AB (ref 27.2–33.4)
MCHC: 31.8 g/dL — AB (ref 32.0–36.0)
MCV: 84.3 fL (ref 79.3–98.0)
MONOS PCT: 10 %
Monocytes Absolute: 0.9 10*3/uL (ref 0.1–0.9)
NEUTROS PCT: 61 %
Neutro Abs: 5.5 10*3/uL (ref 1.5–6.5)
Platelets: 228 10*3/uL (ref 140–400)
RBC: 5.59 MIL/uL (ref 4.20–5.82)
RDW: 16.1 % — ABNORMAL HIGH (ref 11.0–14.6)
WBC: 9 10*3/uL (ref 4.0–10.3)

## 2018-01-06 NOTE — Progress Notes (Signed)
HGB 15 today: Phlebotomy held, per Dr. Alvy Bimler.

## 2018-01-21 DIAGNOSIS — M65332 Trigger finger, left middle finger: Secondary | ICD-10-CM | POA: Diagnosis not present

## 2018-02-03 ENCOUNTER — Telehealth: Payer: Self-pay | Admitting: Hematology and Oncology

## 2018-02-03 NOTE — Telephone Encounter (Signed)
Patient called to cancel °

## 2018-02-04 ENCOUNTER — Other Ambulatory Visit: Payer: BLUE CROSS/BLUE SHIELD

## 2018-02-17 ENCOUNTER — Inpatient Hospital Stay: Payer: BLUE CROSS/BLUE SHIELD | Attending: Hematology and Oncology

## 2018-02-17 ENCOUNTER — Inpatient Hospital Stay: Payer: BLUE CROSS/BLUE SHIELD

## 2018-02-17 VITALS — BP 127/72 | HR 71 | Temp 97.9°F | Resp 18

## 2018-02-17 DIAGNOSIS — D751 Secondary polycythemia: Secondary | ICD-10-CM

## 2018-02-17 LAB — CBC WITH DIFFERENTIAL/PLATELET
BASOS ABS: 0.1 10*3/uL (ref 0.0–0.1)
BASOS PCT: 1 %
EOS ABS: 0.2 10*3/uL (ref 0.0–0.5)
EOS PCT: 2 %
HCT: 48.5 % (ref 38.4–49.9)
Hemoglobin: 15.5 g/dL (ref 13.0–17.1)
LYMPHS PCT: 30 %
Lymphs Abs: 2.6 10*3/uL (ref 0.9–3.3)
MCH: 27 pg — ABNORMAL LOW (ref 27.2–33.4)
MCHC: 32 g/dL (ref 32.0–36.0)
MCV: 84.3 fL (ref 79.3–98.0)
MONO ABS: 1.1 10*3/uL — AB (ref 0.1–0.9)
Monocytes Relative: 13 %
Neutro Abs: 4.7 10*3/uL (ref 1.5–6.5)
Neutrophils Relative %: 54 %
PLATELETS: 190 10*3/uL (ref 140–400)
RBC: 5.75 MIL/uL (ref 4.20–5.82)
RDW: 16.5 % — AB (ref 11.0–14.6)
WBC: 8.6 10*3/uL (ref 4.0–10.3)

## 2018-02-17 NOTE — Progress Notes (Signed)
Per MD Alvy Bimler since pt's Hgb is below 16 today pt has option to either have phlebotomy done today and cancel the next one or cancel today and do the next one instead.  Pt states he would like to have phlebotomy today.  Asymptomatic.  A&Ox4.  VS stable.   Pt presents today for phlebotomy per MD orders. Phlebotomy procedure started at 0902 and ended at 0909. 560 grams removed. Patient observed for 30 minutes after procedure without any incident.  Drank water but refused food. Tolerated procedure well.  VS stable. 16 G LAC IV needle removed intact.

## 2018-02-17 NOTE — Patient Instructions (Signed)

## 2018-03-18 ENCOUNTER — Inpatient Hospital Stay: Payer: BLUE CROSS/BLUE SHIELD

## 2018-03-18 ENCOUNTER — Inpatient Hospital Stay: Payer: BLUE CROSS/BLUE SHIELD | Attending: Hematology and Oncology

## 2018-03-26 DIAGNOSIS — E668 Other obesity: Secondary | ICD-10-CM | POA: Diagnosis not present

## 2018-03-26 DIAGNOSIS — M65332 Trigger finger, left middle finger: Secondary | ICD-10-CM | POA: Diagnosis not present

## 2018-03-26 DIAGNOSIS — I130 Hypertensive heart and chronic kidney disease with heart failure and stage 1 through stage 4 chronic kidney disease, or unspecified chronic kidney disease: Secondary | ICD-10-CM | POA: Diagnosis not present

## 2018-03-26 DIAGNOSIS — E1122 Type 2 diabetes mellitus with diabetic chronic kidney disease: Secondary | ICD-10-CM | POA: Diagnosis not present

## 2018-03-31 ENCOUNTER — Inpatient Hospital Stay: Payer: BLUE CROSS/BLUE SHIELD

## 2018-03-31 ENCOUNTER — Inpatient Hospital Stay: Payer: BLUE CROSS/BLUE SHIELD | Attending: Hematology and Oncology

## 2018-03-31 DIAGNOSIS — D751 Secondary polycythemia: Secondary | ICD-10-CM | POA: Diagnosis not present

## 2018-03-31 LAB — CBC WITH DIFFERENTIAL/PLATELET
BASOS ABS: 0.1 10*3/uL (ref 0.0–0.1)
Basophils Relative: 1 %
EOS ABS: 0.1 10*3/uL (ref 0.0–0.5)
EOS PCT: 2 %
HCT: 46.5 % (ref 38.4–49.9)
Hemoglobin: 14.9 g/dL (ref 13.0–17.1)
Lymphocytes Relative: 28 %
Lymphs Abs: 2.3 10*3/uL (ref 0.9–3.3)
MCH: 27.2 pg (ref 27.2–33.4)
MCHC: 32 g/dL (ref 32.0–36.0)
MCV: 84.9 fL (ref 79.3–98.0)
Monocytes Absolute: 0.9 10*3/uL (ref 0.1–0.9)
Monocytes Relative: 11 %
Neutro Abs: 4.8 10*3/uL (ref 1.5–6.5)
Neutrophils Relative %: 58 %
PLATELETS: 195 10*3/uL (ref 140–400)
RBC: 5.48 MIL/uL (ref 4.20–5.82)
RDW: 16.4 % — ABNORMAL HIGH (ref 11.0–14.6)
WBC: 8.2 10*3/uL (ref 4.0–10.3)

## 2018-03-31 NOTE — Progress Notes (Signed)
Hemoglobin 14.9 today. Vitals signs stable. No complaints. No phlebotomy needed per Dr. Alvy Bimler. Patient aware to return for his next appointment in June.

## 2018-04-12 ENCOUNTER — Inpatient Hospital Stay (HOSPITAL_BASED_OUTPATIENT_CLINIC_OR_DEPARTMENT_OTHER): Payer: BLUE CROSS/BLUE SHIELD | Admitting: Genetic Counselor

## 2018-04-12 ENCOUNTER — Inpatient Hospital Stay: Payer: BLUE CROSS/BLUE SHIELD

## 2018-04-12 ENCOUNTER — Encounter: Payer: Self-pay | Admitting: Genetic Counselor

## 2018-04-12 DIAGNOSIS — Z801 Family history of malignant neoplasm of trachea, bronchus and lung: Secondary | ICD-10-CM | POA: Diagnosis not present

## 2018-04-12 DIAGNOSIS — Z315 Encounter for genetic counseling: Secondary | ICD-10-CM

## 2018-04-12 DIAGNOSIS — Z8042 Family history of malignant neoplasm of prostate: Secondary | ICD-10-CM | POA: Diagnosis not present

## 2018-04-12 DIAGNOSIS — Z800 Family history of malignant neoplasm of digestive organs: Secondary | ICD-10-CM | POA: Diagnosis not present

## 2018-04-12 DIAGNOSIS — Z803 Family history of malignant neoplasm of breast: Secondary | ICD-10-CM | POA: Diagnosis not present

## 2018-04-12 DIAGNOSIS — Z8481 Family history of carrier of genetic disease: Secondary | ICD-10-CM | POA: Diagnosis not present

## 2018-04-12 NOTE — Progress Notes (Signed)
[facility]      Initial Visit   Patient Name: Chris Long Patient DOB: [birth year] Patient Age: 57 y.o. Encounter Date: 04/12/2018  Referring Provider: [name], MD  Primary Care Provider: [name], MD  Reason for Visit: Evaluate for hereditary susceptibility to cancer    Assessment and Plan:  . Chris Long does not likely have Lynch syndrome due to the PMS2 mutation found in his family because his mother's identical twin sister does not have the mutation. We discussed confirmation of this, but also addressed potential risks of hereditary cancer due to his paternal family history of cancer.    . Testing is recommended to determine whether he has a pathogenic mutation that will impact his screening and risk-reduction for cancer. A negative result will be reassuring.  . Chris Long wished to pursue genetic testing and a blood sample will be sent for analysis of the 83 genes on [facility]'s Multi-Cancer panel (ALK, APC, ATM, AXIN2, BAP1, BARD1, BLM, BMPR1A, BRCA1, BRCA2, BRIP1, CASR, CDC73, CDH1, CDK4, CDKN1B, CDKN1C, CDKN2A, CEBPA, CHEK2, CTNNA1, DICER1, DIS3L2, EGFR, EPCAM, FH, FLCN, GATA2, GPC3, GREM1, HOXB13, HRAS, KIT, MAX, MEN1, MET, MITF, MLH1, MSH2, MSH3, MSH6, MUTYH, NBN, NF1, NF2, NTHL1, PALB2, PDGFRA, PHOX2B, PMS2, POLD1, POLE, POT1, PRKAR1A, PTCH1, PTEN, RAD50, RAD51C, RAD51D, RB1, RECQL4, RET, RUNX1, SDHA, SDHAF2, SDHB, SDHC, SDHD, SMAD4, SMARCA4, SMARCB1, SMARCE1, STK11, SUFU, TERC, TERT, TMEM127, TP53, TSC1, TSC2, VHL, WRN, WT1).   . Results should be available in approximately 2-3 weeks, at which point we will contact him and address implications for him as well as address genetic testing for at-risk family members, if needed.     Chris Long was available for questions concerning this case. Total time spent by me in face-to-face counseling was approximately 30 minutes.    _____________________________________________________________________   History of Present Illness: Mr. Chris Long, a 57 y.o. male, is being seen at the [facility] due to a family history of cancer and a pathogenic PMS2 mutation in his maternal aunt and several cousins. He presents to clinic today with his partner, Chris Long, to discuss the possibility of a hereditary predisposition to cancer and discuss whether genetic testing is warranted.  Chris Long has no personal history of cancer. He indicated that he undergoes yearly PSA and DRE exams. He had his first colonoscopy at age 49 and reports that 5 polyps were removed, but he did not know the type. He had another colonoscopy about 2 years ago which he stated was negative for polyps.  He brought lab reports of an aunt and a cousin from [facility] from 2011 showing the familial mutation PMS2 mutation called 65delT (old nomenclature). He did not know whether anyone in the family has had more updated or expanded panel testing since then.  Past Medical History:  Diagnosis Date  . ASD (atrial septal defect)    with repair in 1972  . Coronary artery disease    Mild CAD per cath in 2010  . Cough, persistent 02/22/2016  . Family history of Lynch syndrome    maternal relatives with a mutation in PMS2 gene  . Hyperlipidemia   . Hypertension   . Hypothyroidism   . Obesity   . Polycythemia   . Prediabetes   . Status post patch closure of ASD 02/09/2014  . Tobacco abuse     Past Surgical History:  Procedure Laterality Date  . ASD REPAIR  1972  . CARDIAC CATHETERIZATION  08/28/2009   EF 60%; Mild CAD with normal LV function  . COLONOSCOPY  2011   outlaw; 5 polyps   . STRESS ECHO TEST  2006   NO ISCHEMIA, NORMAL  . TONSILLECTOMY      Social History   Socioeconomic History  . Marital status: Divorced    Spouse name: Not on file  . Number of children: 0  . Years of education: Not on file  . Highest  education level: Not on file  Occupational History    Comment: real estate.    Social Needs  . Financial resource strain: Not on file  . Food insecurity:    Worry: Not on file    Inability: Not on file  . Transportation needs:    Medical: Not on file    Non-medical: Not on file  Tobacco Use  . Smoking status: Current Every Day Smoker    Packs/day: 0.50    Years: 30.00    Pack years: 15.00    Types: Cigarettes  . Smokeless tobacco: Never Used  Substance and Sexual Activity  . Alcohol use: No  . Drug use: No  . Sexual activity: Yes  Lifestyle  . Physical activity:    Days per week: Not on file    Minutes per session: Not on file  . Stress: Not on file  Relationships  . Social connections:    Talks on phone: Not on file    Gets together: Not on file    Attends religious service: Not on file    Active member of club or organization: Not on file    Attends meetings of clubs or organizations: Not on file    Relationship status: Not on file  Other Topics Concern  . Not on file  Social History Narrative  . Not on file     Family History:  During the visit, a 4-generation pedigree was obtained. Family tree will be scanned in the Media tab in Epic  Significant diagnoses include the following:  Family History  Problem Relation Age of Onset  . Heart attack Mother        X2  . Heart failure Mother   . Hypertension Father   . COPD Father   . Lung cancer Father        deceased 11; smoker  . Cancer Paternal Uncle 13       GI cancer; deceased 56  . Prostate cancer Maternal Uncle 60       deceased 78  . Stomach cancer Paternal Uncle        deceased 54  . Breast cancer Cousin        daughter of a unaffected maternal aunt; deceased 2  . Breast cancer Other        mother's paternal half-sister    Additionally, Chris Long has no children. He has one full sister (age 6) and a paternal and maternal half-sisters. His mother died at 35, cancer-free. His mother had an  identical twin sister (age 49) who had genetic testing and does NOT have the familial PMS2 mutation.  Chris Long ancestry is Caucasian - NOS. There is no known Jewish ancestry and no consanguinity.  Discussion: We reviewed the characteristics, features and inheritance patterns of hereditary cancer syndromes, with a focus on Lynch syndrome and the PMS2 gene. We discussed his risk of harboring the PMS2 mutation or any other mutation in the context of his personal and family history. We discussed the process of genetic testing, insurance coverage  and implications of results: positive, negative and variant of unknown significance (VUS).    Mr. Caughlin questions were answered to his satisfaction today and he is welcome to call with any additional questions or concerns. Thank you for the referral and allowing [place] to share in the care of your patient.    Chris Berg, MS, [place] Certified Genetic Counselor phone: [phone] [email or web address]

## 2018-04-22 ENCOUNTER — Encounter: Payer: Self-pay | Admitting: Genetic Counselor

## 2018-04-22 ENCOUNTER — Ambulatory Visit: Payer: Self-pay | Admitting: Genetic Counselor

## 2018-04-22 DIAGNOSIS — Z1379 Encounter for other screening for genetic and chromosomal anomalies: Secondary | ICD-10-CM

## 2018-04-22 HISTORY — DX: Encounter for other screening for genetic and chromosomal anomalies: Z13.79

## 2018-04-22 NOTE — Progress Notes (Signed)
[facility]       Genetic Test Results    Patient Name: Chris Long Patient DOB: November 15, 1961 Patient Age: 57 y.o. Encounter Date: 04/22/2018  Referring Provider: [name], MD  Primary Care Provider: [name], MD   Chris Long was called today to discuss genetic test results. Please see the Genetics note from his visit on 04/12/2018 for a detailed discussion of his personal and family history.  Genetic Testing: At the time of Chris Long visit, he decided to pursue genetic testing of multiple genes associated with hereditary susceptibility to cancer and specifically wanted to rule out that he has the familial PMS2 mutation called [id number]. Testing included sequencing and deletion/duplication analysis. Testing did not reveal the PMS2 mutation or any other pathogenic mutation in any of the genes analyzed.  A copy of the genetic test report will be scanned into Epic under the Media tab.  We call this result a true negative result because the cancer-causing mutation was identified in Chris Long family, and he did not inherit it. Given this negative result, Chris Long chances of developing PMS2-related cancers are the same as they are in the general population.    The genes analyzed were the 83 genes on [facility]'s Multi-Cancer panel (ALK, APC, ATM, AXIN2, BAP1, BARD1, BLM, BMPR1A, BRCA1, BRCA2, BRIP1, CASR, CDC73, CDH1, CDK4, CDKN1B, CDKN1C, CDKN2A, CEBPA, CHEK2, CTNNA1, DICER1, DIS3L2, EGFR, EPCAM, FH, FLCN, GATA2, GPC3, GREM1, HOXB13, HRAS, KIT, MAX, MEN1, MET, MITF, MLH1, MSH2, MSH3, MSH6, MUTYH, NBN, NF1, NF2, NTHL1, PALB2, PDGFRA, PHOX2B, PMS2, POLD1, POLE, POT1, PRKAR1A, PTCH1, PTEN, RAD50, RAD51C, RAD51D, RB1, RECQL4, RET, RUNX1, SDHA, SDHAF2, SDHB, SDHC, SDHD, SMAD4, SMARCA4, SMARCB1, SMARCE1, STK11, SUFU, TERC, TERT, TMEM127, TP53, TSC1, TSC2, VHL, WRN, WT1).  Since the current test is not perfect, it is possible that there may  be a gene mutation that current testing cannot detect, but that chance is small. It is possible that a different genetic factor, which has not yet been discovered or is not on this panel, is responsible for the cancer diagnoses in the family. Again, the likelihood of this is low. No additional testing is recommended at this time for Chris Long.  Cancer Screening: Chris Long  is recommended to follow the cancer screening guidelines provided by his physicians.   Family Members: Even though Chris Long did not inherit the familial gene mutation, others in the family may have. It is very important all of Chris Long relatives (male and male) know of the presence of this mutation and that they may be at increased risk for cancer. Genetic testing can sort out who in the family is at risk and who is not. Please let us know if we can help facilitate testing. Genetic counselors can be located in other cities, by visiting the website of the [employer] ([employer]ist[employer].se) and Field seismologist for a Dietitian by zip code.  Any relative who had cancer at a young age or had a particularly rare cancer may also wish to pursue genetic testing. Genetic counselors can be located in other cities, by visiting the website of the [employer] ([employer]se) and Field seismologist for a Dietitian by zip code.    Lastly, cancer genetics is a rapidly advancing field and it is possible that new genetic tests will be appropriate for Chris Long in the future. Chris Long is encouraged to remain in  contact with Genetics on an annual basis so we can update his personal and family histories, and let him know of advances in cancer genetics that may benefit the family. Chris Long questions were answered to his satisfaction today, and he knows he is welcome to call anytime with additional questions.     Steele Berg, MS, [place] Certified Genetic Counselor phone:  [phone]

## 2018-04-29 ENCOUNTER — Inpatient Hospital Stay: Payer: BLUE CROSS/BLUE SHIELD | Attending: Hematology and Oncology

## 2018-04-29 ENCOUNTER — Inpatient Hospital Stay: Payer: BLUE CROSS/BLUE SHIELD

## 2018-04-29 DIAGNOSIS — D751 Secondary polycythemia: Secondary | ICD-10-CM

## 2018-04-29 LAB — CBC WITH DIFFERENTIAL/PLATELET
BASOS PCT: 0 %
Basophils Absolute: 0 10*3/uL (ref 0.0–0.1)
EOS PCT: 1 %
Eosinophils Absolute: 0.1 10*3/uL (ref 0.0–0.5)
HEMATOCRIT: 47.9 % (ref 38.4–49.9)
Hemoglobin: 15.3 g/dL (ref 13.0–17.1)
Lymphocytes Relative: 25 %
Lymphs Abs: 2.9 10*3/uL (ref 0.9–3.3)
MCH: 27 pg — ABNORMAL LOW (ref 27.2–33.4)
MCHC: 31.9 g/dL — AB (ref 32.0–36.0)
MCV: 84.5 fL (ref 79.3–98.0)
MONO ABS: 1 10*3/uL — AB (ref 0.1–0.9)
MONOS PCT: 9 %
Neutro Abs: 7.2 10*3/uL — ABNORMAL HIGH (ref 1.5–6.5)
Neutrophils Relative %: 65 %
PLATELETS: 191 10*3/uL (ref 140–400)
RBC: 5.67 MIL/uL (ref 4.20–5.82)
RDW: 16.5 % — AB (ref 11.0–14.6)
WBC: 11.2 10*3/uL — AB (ref 4.0–10.3)

## 2018-04-29 NOTE — Progress Notes (Signed)
No phlebotomy today per MD parameters. Hgb 15.3. Pt denies headache, fatigue, or other symptoms from polycythemia.

## 2018-05-11 ENCOUNTER — Telehealth: Payer: Self-pay | Admitting: Hematology and Oncology

## 2018-05-11 NOTE — Telephone Encounter (Signed)
Patient called to cancel 6/19 MD said it was ok

## 2018-05-12 ENCOUNTER — Inpatient Hospital Stay: Payer: BLUE CROSS/BLUE SHIELD

## 2018-06-10 ENCOUNTER — Other Ambulatory Visit: Payer: BLUE CROSS/BLUE SHIELD

## 2018-06-22 ENCOUNTER — Telehealth: Payer: Self-pay | Admitting: Hematology and Oncology

## 2018-06-22 ENCOUNTER — Inpatient Hospital Stay: Payer: BLUE CROSS/BLUE SHIELD

## 2018-06-22 ENCOUNTER — Inpatient Hospital Stay: Payer: BLUE CROSS/BLUE SHIELD | Attending: Hematology and Oncology | Admitting: Hematology and Oncology

## 2018-06-22 ENCOUNTER — Encounter: Payer: Self-pay | Admitting: Hematology and Oncology

## 2018-06-22 DIAGNOSIS — D751 Secondary polycythemia: Secondary | ICD-10-CM | POA: Insufficient documentation

## 2018-06-22 DIAGNOSIS — Z720 Tobacco use: Secondary | ICD-10-CM | POA: Diagnosis not present

## 2018-06-22 LAB — CBC WITH DIFFERENTIAL/PLATELET
BASOS PCT: 1 %
Basophils Absolute: 0.1 10*3/uL (ref 0.0–0.1)
EOS ABS: 0.2 10*3/uL (ref 0.0–0.5)
Eosinophils Relative: 2 %
HEMATOCRIT: 51.1 % — AB (ref 38.4–49.9)
Hemoglobin: 16.5 g/dL (ref 13.0–17.1)
LYMPHS ABS: 2.5 10*3/uL (ref 0.9–3.3)
Lymphocytes Relative: 30 %
MCH: 27.6 pg (ref 27.2–33.4)
MCHC: 32.3 g/dL (ref 32.0–36.0)
MCV: 85.5 fL (ref 79.3–98.0)
MONO ABS: 0.8 10*3/uL (ref 0.1–0.9)
MONOS PCT: 10 %
NEUTROS ABS: 4.8 10*3/uL (ref 1.5–6.5)
Neutrophils Relative %: 57 %
Platelets: 192 10*3/uL (ref 140–400)
RBC: 5.98 MIL/uL — ABNORMAL HIGH (ref 4.20–5.82)
RDW: 17.1 % — AB (ref 11.0–14.6)
WBC: 8.3 10*3/uL (ref 4.0–10.3)

## 2018-06-22 NOTE — Assessment & Plan Note (Signed)
We discussed various strategies to help him quit smoking The patient has made a commitment to quit before I see him back next year I also encourage lifestyle modification, dietary change and weight loss as tolerated

## 2018-06-22 NOTE — Progress Notes (Signed)
Chris Long presents today for phlebotomy per MD orders. LAC accessed with 16 G needle. Phlebotomy procedure started at 10:20 and ended at 10:27. 500 grams removed. IV needle removed intact.  Patient observed for 30 minutes after procedure without any incident. Snack and drink offered, and vitals stable Patient tolerated procedure well.

## 2018-06-22 NOTE — Progress Notes (Signed)
[facility] OFFICE PROGRESS NOTE  [name], MD  ASSESSMENT & PLAN:  Polycythemia The patient has not quit smoking. 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 I recommend 1 unit of phlebotomy monthly to keep hemoglobin less than 16 g and HCT < 48 He will continue aspirin therapy to reduce risk of blood clots. I plan to space out his  phlebotomy to every 8 weeks  Tobacco abuse We discussed various strategies to help him quit smoking The patient has made a commitment to quit before I see him back next year I also encourage lifestyle modification, dietary change and weight loss as tolerated   No orders of the defined types were placed in this encounter.   INTERVAL HISTORY: Chris Long 57 y.o. male returns for further follow-up He tolerated phlebotomy well He is still smoking approximately half a pack of cigarettes per day However, he is motivated to quit smoking and has committed to healthier lifestyle, exercise and dietary modification He denies recent infection, cough, chest pain or shortness of breath  SUMMARY OF HEMATOLOGIC HISTORY:  This is a patient seen by another hematologist in December 2013 for severe erythrocytosis with associated leukocytosis. Peripheral blood for JAK 2 mutation was negative. The patient was advised to stop smoking. He also underwent sleep study and was told he had mild obstructive sleep apnea but declined treatment for now. Since 2015 he had multiple phlebotomy sessions. CT lung screening in April 2017 was negative  I have reviewed the past medical history, past surgical history, social history and family history with the patient and they are unchanged from previous note.  ALLERGIES:  has No Known Allergies.  MEDICATIONS:  Current Outpatient Medications  Medication Sig Dispense Refill  . aspirin 325 MG tablet Take  325 mg by mouth daily.    Marland Kitchen ezetimibe (ZETIA) 10 MG tablet Take 10 mg by mouth daily.  3  . fish oil-omega-3 fatty acids 1000 MG capsule Take 2 g by mouth daily.    Marland Kitchen JARDIANCE 10 MG TABS tablet Take 10 mg by mouth daily.  6  . levothyroxine (SYNTHROID, LEVOTHROID) 88 MCG tablet Take 88 mcg by mouth daily.    Marland Kitchen lisinopril (PRINIVIL,ZESTRIL) 10 MG tablet Take 10 mg by mouth daily.  3  . metFORMIN (GLUCOPHAGE-XR) 500 MG 24 hr tablet Take 500 mg by mouth daily.  6  . metoprolol (LOPRESSOR) 50 MG tablet Take 0.5 tablets (25 mg total) by mouth 2 (two) times daily. 90 tablet 3  . Multiple Vitamin (MULTIVITAMIN) capsule Take 1 capsule by mouth daily.    . rosuvastatin (CRESTOR) 10 MG tablet Take 20 mg by mouth daily.     . varenicline (CHANTIX) 0.5 MG tablet Take 1 tablet (0.5 mg total) by mouth 2 (two) times daily. 60 tablet 11   No current facility-administered medications for this visit.      REVIEW OF SYSTEMS:   Constitutional: Denies fevers, chills or night sweats Eyes: Denies blurriness of vision Ears, nose, mouth, throat, and face: Denies mucositis or sore throat Respiratory: Denies cough, dyspnea or wheezes Cardiovascular: Denies palpitation, chest discomfort or lower extremity swelling Gastrointestinal:  Denies nausea, heartburn or change in bowel habits Skin: Denies abnormal skin rashes Lymphatics: Denies new lymphadenopathy or easy bruising Neurological:Denies numbness, tingling or new weaknesses Behavioral/Psych: Mood is stable, no new changes  All other systems were reviewed with the patient  and are negative.  PHYSICAL EXAMINATION: ECOG PERFORMANCE STATUS: 0 - Asymptomatic  Vitals:   06/22/18 0839  BP: 116/84  Pulse: 68  Resp: 18  Temp: 98.8 F (37.1 C)  SpO2: 98%   Filed Weights   06/22/18 0839  Weight: 231 lb (104.8 kg)    GENERAL:alert, no distress and comfortable SKIN: skin color, texture, turgor are normal, no rashes or significant lesions EYES: normal,  Conjunctiva are pink and non-injected, sclera clear OROPHARYNX:no exudate, no erythema and lips, buccal mucosa, and tongue normal  NECK: supple, thyroid normal size, non-tender, without nodularity LYMPH:  no palpable lymphadenopathy in the cervical, axillary or inguinal LUNGS: clear to auscultation and percussion with normal breathing effort HEART: regular rate & rhythm and no murmurs and no lower extremity edema ABDOMEN:abdomen soft, non-tender and normal bowel sounds Musculoskeletal:no cyanosis of digits and no clubbing  NEURO: alert & oriented x 3 with fluent speech, no focal motor/sensory deficits  LABORATORY DATA:  I have reviewed the data as listed     Component Value Date/Time   NA 139 09/13/2014 1315   K 4.7 09/13/2014 1315   CL 102 09/13/2014 1315   CO2 23 09/13/2014 1315   GLUCOSE 114 (H) 09/13/2014 1315   BUN 15 09/13/2014 1315   CREATININE 0.90 09/13/2014 1315   CALCIUM 9.7 09/13/2014 1315   PROT 7.2 09/13/2014 1315   ALBUMIN 3.9 09/13/2014 1315   AST 21 09/13/2014 1315   ALT 32 09/13/2014 1315   ALKPHOS 60 09/13/2014 1315   BILITOT 0.3 09/13/2014 1315   GFRNONAA >90 09/13/2014 1315   GFRAA >90 09/13/2014 1315    No results found for: SPEP, UPEP  Lab Results  Component Value Date   WBC 8.3 06/22/2018   NEUTROABS 4.8 06/22/2018   HGB 16.5 06/22/2018   HCT 51.1 (H) 06/22/2018   MCV 85.5 06/22/2018   PLT 192 06/22/2018      Chemistry      Component Value Date/Time   NA 139 09/13/2014 1315   K 4.7 09/13/2014 1315   CL 102 09/13/2014 1315   CO2 23 09/13/2014 1315   BUN 15 09/13/2014 1315   CREATININE 0.90 09/13/2014 1315      Component Value Date/Time   CALCIUM 9.7 09/13/2014 1315   ALKPHOS 60 09/13/2014 1315   AST 21 09/13/2014 1315   ALT 32 09/13/2014 1315   BILITOT 0.3 09/13/2014 1315      I spent 10 minutes counseling the patient face to face. The total time spent in the appointment was 15 minutes and more than 50% was on counseling.   All  questions were answered. The patient knows to call the clinic with any problems, questions or concerns. No barriers to learning was detected.    Heath Lark, MD 7/30/20199:20 AM

## 2018-06-22 NOTE — Patient Instructions (Signed)
Therapeutic Phlebotomy Discharge Instructions  - Increase your fluid intake over the next 4 hours  - No smoking for 30 minutes  - Avoid using the affected arm (the one you had the blood drawn from) for heavy lifting or other activities.  - You may resume all normal activities after 30 minutes.  You are to notify the office if you experience:   - Persistent dizziness and/or lightheadedness -Uncontrolled or excessive bleeding at the site.   Therapeutic Phlebotomy, Care After Refer to this sheet in the next few weeks. These instructions provide you with information about caring for yourself after your procedure. Your health care provider may also give you more specific instructions. Your treatment has been planned according to current medical practices, but problems sometimes occur. Call your health care provider if you have any problems or questions after your procedure. What can I expect after the procedure? After the procedure, it is common to have:  Light-headedness or dizziness. You may feel faint.  Nausea.  Tiredness.  Follow these instructions at home: Activity  Return to your normal activities as directed by your health care provider. Most people can go back to their normal activities right away.  Avoid strenuous physical activity and heavy lifting or pulling for about 5 hours after the procedure. Do not lift anything that is heavier than 10 lb (4.5 kg).  Athletes should avoid strenuous exercise for at least 12 hours.  Change positions slowly for the remainder of the day. This will help to prevent light-headedness or fainting.  If you feel light-headed, lie down until the feeling goes away. Eating and drinking  Be sure to eat well-balanced meals for the next 24 hours.  Drink enough fluid to keep your urine clear or pale yellow.  Avoid drinking alcohol on the day that you had the procedure. Care of the Needle Insertion Site  Keep your bandage dry. You can remove the  bandage after about 5 hours or as directed by your health care provider.  If you have bleeding from the needle insertion site, elevate your arm and press firmly on the site until the bleeding stops.  If you have bruising at the site, apply ice to the area: ? Put ice in a plastic bag. ? Place a towel between your skin and the bag. ? Leave the ice on for 20 minutes, 2-3 times a day for the first 24 hours.  If the swelling does not go away after 24 hours, apply a warm, moist washcloth to the area for 20 minutes, 2-3 times a day. General instructions  Avoid smoking for at least 30 minutes after the procedure.  Keep all follow-up visits as directed by your health care provider. It is important to continue with further therapeutic phlebotomy treatments as directed. Contact a health care provider if:  You have redness, swelling, or pain at the needle insertion site.  You have fluid, blood, or pus coming from the needle insertion site.  You feel light-headed, dizzy, or nauseated, and the feeling does not go away.  You notice new bruising at the needle insertion site.  You feel weaker than normal.  You have a fever or chills. Get help right away if:  You have severe nausea or vomiting.  You have chest pain.  You have trouble breathing. This information is not intended to replace advice given to you by your health care provider. Make sure you discuss any questions you have with your health care provider. Document Released: 04/14/2011 Document Revised: 07/12/2016   Document Reviewed: 11/06/2014 Elsevier Interactive Patient Education  2018 Elsevier Inc.   

## 2018-06-22 NOTE — Telephone Encounter (Signed)
Per 7/30 los.  Gave patient AVS.  Patient my chart active and did not need calendar.

## 2018-06-22 NOTE — Assessment & Plan Note (Signed)
The patient has not quit smoking. 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 I recommend 1 unit of phlebotomy monthly to keep hemoglobin less than 16 g and HCT < 48 He will continue aspirin therapy to reduce risk of blood clots. I plan to space out his  phlebotomy to every 8 weeks

## 2018-07-22 ENCOUNTER — Other Ambulatory Visit: Payer: BLUE CROSS/BLUE SHIELD

## 2018-08-10 DIAGNOSIS — E1122 Type 2 diabetes mellitus with diabetic chronic kidney disease: Secondary | ICD-10-CM | POA: Diagnosis not present

## 2018-08-10 DIAGNOSIS — Z0000 Encounter for general adult medical examination without abnormal findings: Secondary | ICD-10-CM | POA: Diagnosis not present

## 2018-08-10 DIAGNOSIS — R82998 Other abnormal findings in urine: Secondary | ICD-10-CM | POA: Diagnosis not present

## 2018-08-10 DIAGNOSIS — Z125 Encounter for screening for malignant neoplasm of prostate: Secondary | ICD-10-CM | POA: Diagnosis not present

## 2018-08-10 DIAGNOSIS — E038 Other specified hypothyroidism: Secondary | ICD-10-CM | POA: Diagnosis not present

## 2018-08-17 DIAGNOSIS — Z23 Encounter for immunization: Secondary | ICD-10-CM | POA: Diagnosis not present

## 2018-08-17 DIAGNOSIS — I130 Hypertensive heart and chronic kidney disease with heart failure and stage 1 through stage 4 chronic kidney disease, or unspecified chronic kidney disease: Secondary | ICD-10-CM | POA: Diagnosis not present

## 2018-08-17 DIAGNOSIS — Z0000 Encounter for general adult medical examination without abnormal findings: Secondary | ICD-10-CM | POA: Diagnosis not present

## 2018-08-17 DIAGNOSIS — M65332 Trigger finger, left middle finger: Secondary | ICD-10-CM | POA: Diagnosis not present

## 2018-08-17 DIAGNOSIS — R972 Elevated prostate specific antigen [PSA]: Secondary | ICD-10-CM | POA: Diagnosis not present

## 2018-08-17 DIAGNOSIS — Z1389 Encounter for screening for other disorder: Secondary | ICD-10-CM | POA: Diagnosis not present

## 2018-08-18 ENCOUNTER — Inpatient Hospital Stay: Payer: BLUE CROSS/BLUE SHIELD

## 2018-08-18 ENCOUNTER — Inpatient Hospital Stay: Payer: BLUE CROSS/BLUE SHIELD | Attending: Hematology and Oncology

## 2018-08-18 DIAGNOSIS — D751 Secondary polycythemia: Secondary | ICD-10-CM | POA: Insufficient documentation

## 2018-08-18 LAB — CBC WITH DIFFERENTIAL/PLATELET
Basophils Absolute: 0.1 10*3/uL (ref 0.0–0.1)
Basophils Relative: 1 %
EOS ABS: 0.2 10*3/uL (ref 0.0–0.5)
Eosinophils Relative: 2 %
HCT: 50.5 % — ABNORMAL HIGH (ref 38.4–49.9)
Hemoglobin: 16.3 g/dL (ref 13.0–17.1)
LYMPHS ABS: 2.1 10*3/uL (ref 0.9–3.3)
LYMPHS PCT: 24 %
MCH: 27.9 pg (ref 27.2–33.4)
MCHC: 32.3 g/dL (ref 32.0–36.0)
MCV: 86.4 fL (ref 79.3–98.0)
Monocytes Absolute: 1 10*3/uL — ABNORMAL HIGH (ref 0.1–0.9)
Monocytes Relative: 12 %
NEUTROS PCT: 61 %
Neutro Abs: 5.3 10*3/uL (ref 1.5–6.5)
PLATELETS: 175 10*3/uL (ref 140–400)
RBC: 5.84 MIL/uL — ABNORMAL HIGH (ref 4.20–5.82)
RDW: 16.4 % — ABNORMAL HIGH (ref 11.0–14.6)
WBC: 8.6 10*3/uL (ref 4.0–10.3)

## 2018-08-18 NOTE — Progress Notes (Signed)
Chris Long presents today for phlebotomy per MD orders. Phlebotomy procedure started at 0919 and ended at 0924. Phlebotomy kit used in R [facility]. 530g removed; procedure performed by Tivis Ringer., RN. Patient tolerated procedure well. IV needle removed intact.  Pt stayed for 30 minute post observation period. Pt declined snack but provided drink; stated he ate breakfast shortly before coming in for tx. VSS.

## 2018-08-24 DIAGNOSIS — Z1212 Encounter for screening for malignant neoplasm of rectum: Secondary | ICD-10-CM | POA: Diagnosis not present

## 2018-09-21 DIAGNOSIS — M1811 Unilateral primary osteoarthritis of first carpometacarpal joint, right hand: Secondary | ICD-10-CM | POA: Diagnosis not present

## 2018-09-21 DIAGNOSIS — M65332 Trigger finger, left middle finger: Secondary | ICD-10-CM | POA: Diagnosis not present

## 2018-10-13 ENCOUNTER — Inpatient Hospital Stay: Payer: BLUE CROSS/BLUE SHIELD

## 2018-10-13 ENCOUNTER — Inpatient Hospital Stay: Payer: BLUE CROSS/BLUE SHIELD | Attending: Hematology and Oncology

## 2018-10-13 DIAGNOSIS — D751 Secondary polycythemia: Secondary | ICD-10-CM

## 2018-10-13 LAB — CBC WITH DIFFERENTIAL/PLATELET
Abs Immature Granulocytes: 0.04 10*3/uL (ref 0.00–0.07)
BASOS ABS: 0.1 10*3/uL (ref 0.0–0.1)
Basophils Relative: 1 %
EOS ABS: 0.2 10*3/uL (ref 0.0–0.5)
EOS PCT: 2 %
HEMATOCRIT: 52.6 % — AB (ref 39.0–52.0)
Hemoglobin: 16.8 g/dL (ref 13.0–17.0)
Immature Granulocytes: 0 %
LYMPHS ABS: 2.7 10*3/uL (ref 0.7–4.0)
Lymphocytes Relative: 29 %
MCH: 28.1 pg (ref 26.0–34.0)
MCHC: 31.9 g/dL (ref 30.0–36.0)
MCV: 88 fL (ref 80.0–100.0)
Monocytes Absolute: 1 10*3/uL (ref 0.1–1.0)
Monocytes Relative: 11 %
NRBC: 0 % (ref 0.0–0.2)
Neutro Abs: 5.4 10*3/uL (ref 1.7–7.7)
Neutrophils Relative %: 57 %
Platelets: 243 10*3/uL (ref 150–400)
RBC: 5.98 MIL/uL — ABNORMAL HIGH (ref 4.22–5.81)
RDW: 14.7 % (ref 11.5–15.5)
WBC: 9.3 10*3/uL (ref 4.0–10.5)

## 2018-10-13 NOTE — Patient Instructions (Signed)
Therapeutic Phlebotomy Discharge Instructions  - Increase your fluid intake over the next 4 hours  - No smoking for 30 minutes  - Avoid using the affected arm (the one you had the blood drawn from) for heavy lifting or other activities.  - You may resume all normal activities after 30 minutes.  You are to notify the office if you experience:   - Persistent dizziness and/or lightheadedness -Uncontrolled or excessive bleeding at the site.   Therapeutic Phlebotomy, Care After Refer to this sheet in the next few weeks. These instructions provide you with information about caring for yourself after your procedure. Your health care provider may also give you more specific instructions. Your treatment has been planned according to current medical practices, but problems sometimes occur. Call your health care provider if you have any problems or questions after your procedure. What can I expect after the procedure? After the procedure, it is common to have:  Light-headedness or dizziness. You may feel faint.  Nausea.  Tiredness.  Follow these instructions at home: Activity  Return to your normal activities as directed by your health care provider. Most people can go back to their normal activities right away.  Avoid strenuous physical activity and heavy lifting or pulling for about 5 hours after the procedure. Do not lift anything that is heavier than 10 lb (4.5 kg).  Athletes should avoid strenuous exercise for at least 12 hours.  Change positions slowly for the remainder of the day. This will help to prevent light-headedness or fainting.  If you feel light-headed, lie down until the feeling goes away. Eating and drinking  Be sure to eat well-balanced meals for the next 24 hours.  Drink enough fluid to keep your urine clear or pale yellow.  Avoid drinking alcohol on the day that you had the procedure. Care of the Needle Insertion Site  Keep your bandage dry. You can remove the  bandage after about 5 hours or as directed by your health care provider.  If you have bleeding from the needle insertion site, elevate your arm and press firmly on the site until the bleeding stops.  If you have bruising at the site, apply ice to the area: ? Put ice in a plastic bag. ? Place a towel between your skin and the bag. ? Leave the ice on for 20 minutes, 2-3 times a day for the first 24 hours.  If the swelling does not go away after 24 hours, apply a warm, moist washcloth to the area for 20 minutes, 2-3 times a day. General instructions  Avoid smoking for at least 30 minutes after the procedure.  Keep all follow-up visits as directed by your health care provider. It is important to continue with further therapeutic phlebotomy treatments as directed. Contact a health care provider if:  You have redness, swelling, or pain at the needle insertion site.  You have fluid, blood, or pus coming from the needle insertion site.  You feel light-headed, dizzy, or nauseated, and the feeling does not go away.  You notice new bruising at the needle insertion site.  You feel weaker than normal.  You have a fever or chills. Get help right away if:  You have severe nausea or vomiting.  You have chest pain.  You have trouble breathing. This information is not intended to replace advice given to you by your health care provider. Make sure you discuss any questions you have with your health care provider. Document Released: 04/14/2011 Document Revised: 07/12/2016   Document Reviewed: 11/06/2014 Elsevier Interactive Patient Education  2018 Elsevier Inc.   

## 2018-10-19 DIAGNOSIS — M1811 Unilateral primary osteoarthritis of first carpometacarpal joint, right hand: Secondary | ICD-10-CM | POA: Diagnosis not present

## 2018-10-19 DIAGNOSIS — M65332 Trigger finger, left middle finger: Secondary | ICD-10-CM | POA: Diagnosis not present

## 2018-10-29 DIAGNOSIS — H2513 Age-related nuclear cataract, bilateral: Secondary | ICD-10-CM | POA: Diagnosis not present

## 2018-10-29 DIAGNOSIS — H04123 Dry eye syndrome of bilateral lacrimal glands: Secondary | ICD-10-CM | POA: Diagnosis not present

## 2018-10-29 DIAGNOSIS — H5213 Myopia, bilateral: Secondary | ICD-10-CM | POA: Diagnosis not present

## 2018-12-08 ENCOUNTER — Inpatient Hospital Stay: Payer: BLUE CROSS/BLUE SHIELD

## 2018-12-08 ENCOUNTER — Inpatient Hospital Stay: Payer: BLUE CROSS/BLUE SHIELD | Attending: Hematology and Oncology

## 2018-12-08 VITALS — BP 104/73 | HR 72 | Temp 98.2°F | Resp 16

## 2018-12-08 DIAGNOSIS — D751 Secondary polycythemia: Secondary | ICD-10-CM | POA: Insufficient documentation

## 2018-12-08 LAB — CBC WITH DIFFERENTIAL/PLATELET
Abs Immature Granulocytes: 0.03 10*3/uL (ref 0.00–0.07)
BASOS PCT: 1 %
Basophils Absolute: 0.1 10*3/uL (ref 0.0–0.1)
EOS ABS: 0.2 10*3/uL (ref 0.0–0.5)
Eosinophils Relative: 2 %
HEMATOCRIT: 50.6 % (ref 39.0–52.0)
HEMOGLOBIN: 16.2 g/dL (ref 13.0–17.0)
Immature Granulocytes: 0 %
Lymphocytes Relative: 25 %
Lymphs Abs: 2.8 10*3/uL (ref 0.7–4.0)
MCH: 28.2 pg (ref 26.0–34.0)
MCHC: 32 g/dL (ref 30.0–36.0)
MCV: 88 fL (ref 80.0–100.0)
Monocytes Absolute: 1.1 10*3/uL — ABNORMAL HIGH (ref 0.1–1.0)
Monocytes Relative: 10 %
NEUTROS ABS: 6.9 10*3/uL (ref 1.7–7.7)
NEUTROS PCT: 62 %
NRBC: 0 % (ref 0.0–0.2)
Platelets: 205 10*3/uL (ref 150–400)
RBC: 5.75 MIL/uL (ref 4.22–5.81)
RDW: 15 % (ref 11.5–15.5)
WBC: 11.1 10*3/uL — AB (ref 4.0–10.5)

## 2018-12-08 NOTE — Progress Notes (Signed)
Chris Long presents today for phlebotomy per MD orders. Phlebotomy procedure completed by Rodney Langton, RN. Phlebotomy kit used in R AC. Patient tolerated procedure well. IV needle removed intact upon removal.  Pt stayed for 30 minute post observation period. VSS. Pt with snack and drink.

## 2018-12-16 DIAGNOSIS — R972 Elevated prostate specific antigen [PSA]: Secondary | ICD-10-CM | POA: Diagnosis not present

## 2018-12-16 DIAGNOSIS — R748 Abnormal levels of other serum enzymes: Secondary | ICD-10-CM | POA: Diagnosis not present

## 2018-12-16 DIAGNOSIS — J449 Chronic obstructive pulmonary disease, unspecified: Secondary | ICD-10-CM | POA: Diagnosis not present

## 2018-12-16 DIAGNOSIS — E038 Other specified hypothyroidism: Secondary | ICD-10-CM | POA: Diagnosis not present

## 2018-12-16 DIAGNOSIS — E1122 Type 2 diabetes mellitus with diabetic chronic kidney disease: Secondary | ICD-10-CM | POA: Diagnosis not present

## 2018-12-16 DIAGNOSIS — I130 Hypertensive heart and chronic kidney disease with heart failure and stage 1 through stage 4 chronic kidney disease, or unspecified chronic kidney disease: Secondary | ICD-10-CM | POA: Diagnosis not present

## 2019-01-20 DIAGNOSIS — F17210 Nicotine dependence, cigarettes, uncomplicated: Secondary | ICD-10-CM | POA: Diagnosis not present

## 2019-01-20 DIAGNOSIS — J189 Pneumonia, unspecified organism: Secondary | ICD-10-CM | POA: Diagnosis not present

## 2019-01-20 DIAGNOSIS — R509 Fever, unspecified: Secondary | ICD-10-CM | POA: Diagnosis not present

## 2019-01-20 DIAGNOSIS — Z6831 Body mass index (BMI) 31.0-31.9, adult: Secondary | ICD-10-CM | POA: Diagnosis not present

## 2019-02-02 ENCOUNTER — Other Ambulatory Visit: Payer: Self-pay

## 2019-02-02 ENCOUNTER — Inpatient Hospital Stay: Payer: BLUE CROSS/BLUE SHIELD | Attending: Hematology and Oncology

## 2019-02-02 ENCOUNTER — Inpatient Hospital Stay: Payer: BLUE CROSS/BLUE SHIELD

## 2019-02-02 VITALS — BP 110/74 | HR 73 | Temp 97.9°F | Resp 17

## 2019-02-02 DIAGNOSIS — D751 Secondary polycythemia: Secondary | ICD-10-CM | POA: Insufficient documentation

## 2019-02-02 LAB — CBC WITH DIFFERENTIAL/PLATELET
ABS IMMATURE GRANULOCYTES: 0.03 10*3/uL (ref 0.00–0.07)
BASOS PCT: 1 %
Basophils Absolute: 0.1 10*3/uL (ref 0.0–0.1)
Eosinophils Absolute: 0.2 10*3/uL (ref 0.0–0.5)
Eosinophils Relative: 2 %
HCT: 52.5 % — ABNORMAL HIGH (ref 39.0–52.0)
Hemoglobin: 16.3 g/dL (ref 13.0–17.0)
IMMATURE GRANULOCYTES: 0 %
LYMPHS PCT: 26 %
Lymphs Abs: 2.6 10*3/uL (ref 0.7–4.0)
MCH: 28 pg (ref 26.0–34.0)
MCHC: 31 g/dL (ref 30.0–36.0)
MCV: 90.2 fL (ref 80.0–100.0)
Monocytes Absolute: 1.1 10*3/uL — ABNORMAL HIGH (ref 0.1–1.0)
Monocytes Relative: 11 %
NEUTROS ABS: 6 10*3/uL (ref 1.7–7.7)
NEUTROS PCT: 60 %
PLATELETS: 224 10*3/uL (ref 150–400)
RBC: 5.82 MIL/uL — ABNORMAL HIGH (ref 4.22–5.81)
RDW: 15.2 % (ref 11.5–15.5)
WBC: 10 10*3/uL (ref 4.0–10.5)
nRBC: 0 % (ref 0.0–0.2)

## 2019-02-02 NOTE — Patient Instructions (Signed)

## 2019-02-02 NOTE — Progress Notes (Signed)
Chris Long presents today for phlebotomy per MD orders. RAC accessed with 16G phlebotomy kit. Phlebotomy procedure started at 08:56 and ended at 09:06. 507 grams removed. IV needle removed intact. Patient tolerated procedure well. Snack and drink provided, and patient observed for 30 minutes after procedure. VSS and pt ambulated out of clinic without incident.

## 2019-02-08 DIAGNOSIS — J189 Pneumonia, unspecified organism: Secondary | ICD-10-CM | POA: Diagnosis not present

## 2019-03-30 ENCOUNTER — Inpatient Hospital Stay: Payer: BLUE CROSS/BLUE SHIELD

## 2019-03-30 ENCOUNTER — Other Ambulatory Visit: Payer: Self-pay

## 2019-03-30 ENCOUNTER — Inpatient Hospital Stay: Payer: BLUE CROSS/BLUE SHIELD | Attending: Hematology and Oncology

## 2019-03-30 VITALS — BP 126/84 | HR 70 | Temp 98.2°F | Resp 18

## 2019-03-30 DIAGNOSIS — D751 Secondary polycythemia: Secondary | ICD-10-CM | POA: Insufficient documentation

## 2019-03-30 LAB — CBC WITH DIFFERENTIAL/PLATELET
Abs Immature Granulocytes: 0.03 10*3/uL (ref 0.00–0.07)
Basophils Absolute: 0.1 10*3/uL (ref 0.0–0.1)
Basophils Relative: 1 %
Eosinophils Absolute: 0.2 10*3/uL (ref 0.0–0.5)
Eosinophils Relative: 3 %
HCT: 51.9 % (ref 39.0–52.0)
Hemoglobin: 16.3 g/dL (ref 13.0–17.0)
Immature Granulocytes: 0 %
Lymphocytes Relative: 29 %
Lymphs Abs: 2.4 10*3/uL (ref 0.7–4.0)
MCH: 28.2 pg (ref 26.0–34.0)
MCHC: 31.4 g/dL (ref 30.0–36.0)
MCV: 89.8 fL (ref 80.0–100.0)
Monocytes Absolute: 0.9 10*3/uL (ref 0.1–1.0)
Monocytes Relative: 11 %
Neutro Abs: 4.8 10*3/uL (ref 1.7–7.7)
Neutrophils Relative %: 56 %
Platelets: 209 10*3/uL (ref 150–400)
RBC: 5.78 MIL/uL (ref 4.22–5.81)
RDW: 15.1 % (ref 11.5–15.5)
WBC: 8.5 10*3/uL (ref 4.0–10.5)
nRBC: 0 % (ref 0.0–0.2)

## 2019-03-30 NOTE — Progress Notes (Signed)
Chris Long presents today for phlebotomy per MD orders. Phlebotomy procedure started at 0837 and ended at 68. 38 G used in R AC.Marland Kitchen 525 cc removed. Patient tolerated procedure well. IV needle removed intact. VSS at d/c.

## 2019-03-30 NOTE — Patient Instructions (Signed)

## 2019-04-08 DIAGNOSIS — M25511 Pain in right shoulder: Secondary | ICD-10-CM | POA: Diagnosis not present

## 2019-04-08 DIAGNOSIS — H269 Unspecified cataract: Secondary | ICD-10-CM | POA: Diagnosis not present

## 2019-04-08 DIAGNOSIS — R972 Elevated prostate specific antigen [PSA]: Secondary | ICD-10-CM | POA: Diagnosis not present

## 2019-04-08 DIAGNOSIS — M65332 Trigger finger, left middle finger: Secondary | ICD-10-CM | POA: Diagnosis not present

## 2019-04-12 DIAGNOSIS — Z20818 Contact with and (suspected) exposure to other bacterial communicable diseases: Secondary | ICD-10-CM | POA: Diagnosis not present

## 2019-04-12 DIAGNOSIS — E038 Other specified hypothyroidism: Secondary | ICD-10-CM | POA: Diagnosis not present

## 2019-05-26 ENCOUNTER — Inpatient Hospital Stay (HOSPITAL_BASED_OUTPATIENT_CLINIC_OR_DEPARTMENT_OTHER): Payer: BC Managed Care – PPO | Admitting: Hematology and Oncology

## 2019-05-26 ENCOUNTER — Other Ambulatory Visit: Payer: Self-pay | Admitting: Hematology and Oncology

## 2019-05-26 ENCOUNTER — Ambulatory Visit: Payer: BLUE CROSS/BLUE SHIELD

## 2019-05-26 ENCOUNTER — Inpatient Hospital Stay: Payer: BC Managed Care – PPO | Attending: Hematology and Oncology

## 2019-05-26 ENCOUNTER — Encounter: Payer: Self-pay | Admitting: Hematology and Oncology

## 2019-05-26 ENCOUNTER — Telehealth: Payer: Self-pay | Admitting: Hematology and Oncology

## 2019-05-26 ENCOUNTER — Other Ambulatory Visit: Payer: Self-pay

## 2019-05-26 VITALS — BP 122/86 | HR 78 | Temp 98.2°F | Resp 18

## 2019-05-26 DIAGNOSIS — E669 Obesity, unspecified: Secondary | ICD-10-CM | POA: Insufficient documentation

## 2019-05-26 DIAGNOSIS — E663 Overweight: Secondary | ICD-10-CM | POA: Insufficient documentation

## 2019-05-26 DIAGNOSIS — D751 Secondary polycythemia: Secondary | ICD-10-CM | POA: Insufficient documentation

## 2019-05-26 DIAGNOSIS — Z720 Tobacco use: Secondary | ICD-10-CM

## 2019-05-26 LAB — CBC WITH DIFFERENTIAL/PLATELET
Abs Immature Granulocytes: 0.03 10*3/uL (ref 0.00–0.07)
Basophils Absolute: 0.1 10*3/uL (ref 0.0–0.1)
Basophils Relative: 1 %
Eosinophils Absolute: 0.2 10*3/uL (ref 0.0–0.5)
Eosinophils Relative: 2 %
HCT: 52.5 % — ABNORMAL HIGH (ref 39.0–52.0)
Hemoglobin: 16.7 g/dL (ref 13.0–17.0)
Immature Granulocytes: 0 %
Lymphocytes Relative: 23 %
Lymphs Abs: 2.5 10*3/uL (ref 0.7–4.0)
MCH: 28.4 pg (ref 26.0–34.0)
MCHC: 31.8 g/dL (ref 30.0–36.0)
MCV: 89.3 fL (ref 80.0–100.0)
Monocytes Absolute: 1 10*3/uL (ref 0.1–1.0)
Monocytes Relative: 9 %
Neutro Abs: 6.9 10*3/uL (ref 1.7–7.7)
Neutrophils Relative %: 65 %
Platelets: 205 10*3/uL (ref 150–400)
RBC: 5.88 MIL/uL — ABNORMAL HIGH (ref 4.22–5.81)
RDW: 14.6 % (ref 11.5–15.5)
WBC: 10.7 10*3/uL — ABNORMAL HIGH (ref 4.0–10.5)
nRBC: 0 % (ref 0.0–0.2)

## 2019-05-26 NOTE — Telephone Encounter (Signed)
I talk with patient regarding schedule  

## 2019-05-26 NOTE — Assessment & Plan Note (Signed)
He is obese with multiple risk factors for heart disease including diabetes, hypertension, hyperlipidemia and smoking We discussed importance of lifestyle modification and weight loss

## 2019-05-26 NOTE — Assessment & Plan Note (Signed)
We discussed various strategies to help him quit smoking I also encourage lifestyle modification, dietary change and weight loss as tolerated 

## 2019-05-26 NOTE — Progress Notes (Signed)
[facility] OFFICE PROGRESS NOTE  [name], MD  ASSESSMENT & PLAN:  Polycythemia The patient has not quit smoking. 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 I recommend 1 unit of phlebotomy every 2 month/8 weeks to keep hemoglobin less than 15 g and HCT < 48 He will continue aspirin therapy to reduce risk of blood clots.  Tobacco abuse We discussed various strategies to help him quit smoking I also encourage lifestyle modification, dietary change and weight loss as tolerated  Obesity (BMI 30-39.9) He is obese with multiple risk factors for heart disease including diabetes, hypertension, hyperlipidemia and smoking We discussed importance of lifestyle modification and weight loss   No orders of the defined types were placed in this encounter.   INTERVAL HISTORY: Chris Long 58 y.o. male returns for further follow-up He continues to gain weight since last time I saw him He denies chest pain or shortness of breath He continues to smoke 10 to 12 cigarettes/day He denies problem with phlebotomy  SUMMARY OF HEMATOLOGIC HISTORY:  This is a patient seen by another hematologist in December 2013 for severe erythrocytosis with associated leukocytosis. Peripheral blood for JAK 2 mutation was negative. The patient was advised to stop smoking. He also underwent sleep study and was told he had mild obstructive sleep apnea but declined treatment for now. Since 2015 he had multiple phlebotomy sessions. CT lung screening in April 2017 was negative  I have reviewed the past medical history, past surgical history, social history and family history with the patient and they are unchanged from previous note.  ALLERGIES:  has No Known Allergies.  MEDICATIONS:  Current Outpatient Medications  Medication Sig Dispense Refill  . aspirin 325 MG tablet Take 325 mg by  mouth daily.    Marland Kitchen ezetimibe (ZETIA) 10 MG tablet Take 10 mg by mouth daily.  3  . fish oil-omega-3 fatty acids 1000 MG capsule Take 2 g by mouth daily.    Marland Kitchen JARDIANCE 10 MG TABS tablet Take 10 mg by mouth daily.  6  . levothyroxine (SYNTHROID, LEVOTHROID) 88 MCG tablet Take 88 mcg by mouth daily.    Marland Kitchen lisinopril (PRINIVIL,ZESTRIL) 10 MG tablet Take 10 mg by mouth daily.  3  . metFORMIN (GLUCOPHAGE-XR) 500 MG 24 hr tablet Take 500 mg by mouth daily.  6  . metoprolol (LOPRESSOR) 50 MG tablet Take 0.5 tablets (25 mg total) by mouth 2 (two) times daily. 90 tablet 3  . Multiple Vitamin (MULTIVITAMIN) capsule Take 1 capsule by mouth daily.    . rosuvastatin (CRESTOR) 10 MG tablet Take 20 mg by mouth daily.     . varenicline (CHANTIX) 0.5 MG tablet Take 1 tablet (0.5 mg total) by mouth 2 (two) times daily. 60 tablet 11   No current facility-administered medications for this visit.      REVIEW OF SYSTEMS:   Constitutional: Denies fevers, chills or night sweats Eyes: Denies blurriness of vision Ears, nose, mouth, throat, and face: Denies mucositis or sore throat Respiratory: Denies cough, dyspnea or wheezes Cardiovascular: Denies palpitation, chest discomfort or lower extremity swelling Gastrointestinal:  Denies nausea, heartburn or change in bowel habits Skin: Denies abnormal skin rashes Lymphatics: Denies new lymphadenopathy or easy bruising Neurological:Denies numbness, tingling or new weaknesses Behavioral/Psych: Mood is stable, no new changes  All other systems were reviewed with the patient and are negative.  PHYSICAL EXAMINATION: ECOG  PERFORMANCE STATUS: 0 - Asymptomatic  Vitals:   05/26/19 0911  BP: 115/76  Pulse: 76  Resp: 18  Temp: 97.7 F (36.5 C)  SpO2: 97%   Filed Weights   05/26/19 0911  Weight: 234 lb 3.2 oz (106.2 kg)    GENERAL:alert, no distress and comfortable Musculoskeletal:no cyanosis of digits and no clubbing  NEURO: alert & oriented x 3 with fluent  speech, no focal motor/sensory deficits  LABORATORY DATA:  I have reviewed the data as listed     Component Value Date/Time   NA 139 09/13/2014 1315   K 4.7 09/13/2014 1315   CL 102 09/13/2014 1315   CO2 23 09/13/2014 1315   GLUCOSE 114 (H) 09/13/2014 1315   BUN 15 09/13/2014 1315   CREATININE 0.90 09/13/2014 1315   CALCIUM 9.7 09/13/2014 1315   PROT 7.2 09/13/2014 1315   ALBUMIN 3.9 09/13/2014 1315   AST 21 09/13/2014 1315   ALT 32 09/13/2014 1315   ALKPHOS 60 09/13/2014 1315   BILITOT 0.3 09/13/2014 1315   GFRNONAA >90 09/13/2014 1315   GFRAA >90 09/13/2014 1315    No results found for: SPEP, UPEP  Lab Results  Component Value Date   WBC 10.7 (H) 05/26/2019   NEUTROABS 6.9 05/26/2019   HGB 16.7 05/26/2019   HCT 52.5 (H) 05/26/2019   MCV 89.3 05/26/2019   PLT 205 05/26/2019      Chemistry      Component Value Date/Time   NA 139 09/13/2014 1315   K 4.7 09/13/2014 1315   CL 102 09/13/2014 1315   CO2 23 09/13/2014 1315   BUN 15 09/13/2014 1315   CREATININE 0.90 09/13/2014 1315      Component Value Date/Time   CALCIUM 9.7 09/13/2014 1315   ALKPHOS 60 09/13/2014 1315   AST 21 09/13/2014 1315   ALT 32 09/13/2014 1315   BILITOT 0.3 09/13/2014 1315       I spent 15 minutes counseling the patient face to face. The total time spent in the appointment was 20 minutes and more than 50% was on counseling.   All questions were answered. The patient knows to call the clinic with any problems, questions or concerns. No barriers to learning was detected.    Heath Lark, MD 7/2/202012:42 PM

## 2019-05-26 NOTE — Patient Instructions (Signed)
Therapeutic Phlebotomy, Care After This sheet gives you information about how to care for yourself after your procedure. Your health care provider may also give you more specific instructions. If you have problems or questions, contact your health care provider. What can I expect after the procedure? After the procedure, it is common to have:  Light-headedness or dizziness. You may feel faint.  Nausea.  Tiredness (fatigue). Follow these instructions at home: Eating and drinking  Be sure to eat well-balanced meals for the next 24 hours.  Drink enough fluid to keep your urine pale yellow.  Avoid drinking alcohol on the day that you had the procedure. Activity   Return to your normal activities as told by your health care provider. Most people can go back to their normal activities right away.  Avoid activities that take a lot of effort for about 5 hours after the procedure. Athletes should avoid strenuous exercise for at least 12 hours.  Avoid heavy lifting or pulling for about 5 hours after the procedure. Do not lift anything that is heavier than 10 lb (4.5 kg).  Change positions slowly for the remainder of the day. This will help to prevent light-headedness or fainting.  If you feel light-headed, lie down until the feeling goes away. Needle insertion site care   Keep your bandage (dressing) dry. You can remove the bandage after about 5 hours or as told by your health care provider.  If you have bleeding from the needle insertion site, raise (elevate) your arm and press firmly on the site until the bleeding stops.  If you have bruising at the site, apply ice to the area: ? Remove the dressing. ? Put ice in a plastic bag. ? Place a towel between your skin and the bag. ? Leave the ice on for 20 minutes, 2-3 times a day for the first 24 hours.  If the swelling does not go away after 24 hours, apply a warm, moist cloth (warm compress) to the area for 20 minutes, 2-3 times a day.  General instructions  Do not use any products that contain nicotine or tobacco, such as cigarettes and e-cigarettes, for at least 30 minutes after the procedure.  Keep all follow-up visits as told by your health care provider. This is important. You may need to continue having regular therapeutic phlebotomy treatments as directed. Contact a health care provider if you:  Have redness, swelling, or pain at the needle insertion site.  Have fluid or blood coming from the needle insertion site.  Have pus or a bad smell coming from the needle insertion site.  Notice that the needle insertion site feels warm to the touch.  Feel light-headed, dizzy, or nauseous, and the feeling does not go away.  Have new bruising at the needle insertion site.  Feel weaker than normal.  Have a fever or chills. Get help right away if:  You faint.  You have chest pain.  You have trouble breathing.  You have severe nausea or vomiting. Summary  After the procedure, it is common to have some light-headedness, dizziness, nausea, or tiredness (fatigue).  Be sure to eat well-balanced meals for the next 24 hours. Drink enough fluid to keep your urine pale yellow.  Return to your normal activities as told by your health care provider.  Keep all follow-up visits as told by your health care provider. You may need to continue having regular therapeutic phlebotomy treatments as directed. This information is not intended to replace advice given to you   by your health care provider. Make sure you discuss any questions you have with your health care provider. Document Released: 04/14/2011 Document Revised: 11/27/2017 Document Reviewed: 11/26/2017 Elsevier Patient Education  2020 [place] (COVID-19) Are you at risk?  Are you at risk for the Coronavirus (COVID-19)?  To be considered HIGH RISK for Coronavirus (COVID-19), you have to meet the following criteria:  . Traveled to [place], [place],  [place], [place] or [place]; or in the [place] to [place], [place], [place], or [place]; and have fever, cough, and shortness of breath within the last 2 weeks of travel OR . Been in close contact with a person diagnosed with COVID-19 within the last 2 weeks and have fever, cough, and shortness of breath . IF YOU DO NOT MEET THESE CRITERIA, YOU ARE CONSIDERED LOW RISK FOR COVID-19.  What to do if you are HIGH RISK for COVID-19?  Marland Kitchen If you are having a medical emergency, call 911. . Seek medical care right away. Before you go to a doctor's office, urgent care or emergency department, call ahead and tell them about your recent travel, contact with someone diagnosed with COVID-19, and your symptoms. You should receive instructions from your physician's office regarding next steps of care.  . When you arrive at healthcare provider, tell the healthcare staff immediately you have returned from visiting [place], [place], [place], [place] or [place]; or traveled in the [place] to [place], [place], [place], or [place]; in the last two weeks or you have been in close contact with a person diagnosed with COVID-19 in the last 2 weeks.   . Tell the health care staff about your symptoms: fever, cough and shortness of breath. . After you have been seen by a medical provider, you will be either: o Tested for (COVID-19) and discharged home on quarantine except to seek medical care if symptoms worsen, and asked to  - Stay home and avoid contact with others until you get your results (4-5 days)  - Avoid travel on public transportation if possible (such as bus, train, or airplane) or o Sent to the Emergency Department by EMS for evaluation, COVID-19 testing, and possible admission depending on your condition and test results.  What to do if you are LOW RISK for COVID-19?  Reduce your risk of any infection by using the same precautions used for avoiding the common cold or flu:  Marland Kitchen Wash your  hands often with soap and warm water for at least 20 seconds.  If soap and water are not readily available, use an alcohol-based hand sanitizer with at least 60% alcohol.  . If coughing or sneezing, cover your mouth and nose by coughing or sneezing into the elbow areas of your shirt or coat, into a tissue or into your sleeve (not your hands). . Avoid shaking hands with others and consider head nods or verbal greetings only. . Avoid touching your eyes, nose, or mouth with unwashed hands.  . Avoid close contact with people who are sick. . Avoid places or events with large numbers of people in one location, like concerts or sporting events. . Carefully consider travel plans you have or are making. . If you are planning any travel outside or inside the [place], visit the CDC's Travelers' Health webpage for the latest health notices. . If you have some symptoms but not all symptoms, continue to monitor at home and seek medical attention if your symptoms worsen. . If you are  having a medical emergency, call 911.   [place] / e-Visit: [email or web address]         [facility]: [facility]: [phone]                   [facility]: [phone]

## 2019-05-26 NOTE — Assessment & Plan Note (Signed)
The patient has not quit smoking. 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 I recommend 1 unit of phlebotomy every 2 month/8 weeks to keep hemoglobin less than 15 g and HCT < 48 He will continue aspirin therapy to reduce risk of blood clots.

## 2019-05-26 NOTE — Progress Notes (Signed)
Chris Long presents today for phlebotomy per MD orders. Phlebotomy procedure started at 1025, 18G PIV needle inserted to RAC and ended at 1035. IV needle removed intact. 500 grams removed. Patient tolerated procedure well. Patient declined post observation for 30 minutes after procedure but stayed for 15 minutes without any incident. He stated that he always does okay. Food and drinks provided.

## 2019-06-21 DIAGNOSIS — H25043 Posterior subcapsular polar age-related cataract, bilateral: Secondary | ICD-10-CM | POA: Diagnosis not present

## 2019-07-21 ENCOUNTER — Inpatient Hospital Stay: Payer: BC Managed Care – PPO | Attending: Hematology and Oncology

## 2019-07-21 ENCOUNTER — Other Ambulatory Visit: Payer: Self-pay

## 2019-07-21 ENCOUNTER — Inpatient Hospital Stay: Payer: BC Managed Care – PPO

## 2019-07-21 VITALS — BP 117/90 | HR 90 | Temp 98.7°F | Resp 16

## 2019-07-21 DIAGNOSIS — D751 Secondary polycythemia: Secondary | ICD-10-CM

## 2019-07-21 LAB — CBC WITH DIFFERENTIAL/PLATELET
Abs Immature Granulocytes: 0.03 10*3/uL (ref 0.00–0.07)
Basophils Absolute: 0.1 10*3/uL (ref 0.0–0.1)
Basophils Relative: 1 %
Eosinophils Absolute: 0.2 10*3/uL (ref 0.0–0.5)
Eosinophils Relative: 2 %
HCT: 51.4 % (ref 39.0–52.0)
Hemoglobin: 16.5 g/dL (ref 13.0–17.0)
Immature Granulocytes: 0 %
Lymphocytes Relative: 32 %
Lymphs Abs: 3 10*3/uL (ref 0.7–4.0)
MCH: 28 pg (ref 26.0–34.0)
MCHC: 32.1 g/dL (ref 30.0–36.0)
MCV: 87.3 fL (ref 80.0–100.0)
Monocytes Absolute: 1.1 10*3/uL — ABNORMAL HIGH (ref 0.1–1.0)
Monocytes Relative: 12 %
Neutro Abs: 5 10*3/uL (ref 1.7–7.7)
Neutrophils Relative %: 53 %
Platelets: 191 10*3/uL (ref 150–400)
RBC: 5.89 MIL/uL — ABNORMAL HIGH (ref 4.22–5.81)
RDW: 14.8 % (ref 11.5–15.5)
WBC: 9.4 10*3/uL (ref 4.0–10.5)
nRBC: 0 % (ref 0.0–0.2)

## 2019-08-18 DIAGNOSIS — E038 Other specified hypothyroidism: Secondary | ICD-10-CM | POA: Diagnosis not present

## 2019-08-18 DIAGNOSIS — E1122 Type 2 diabetes mellitus with diabetic chronic kidney disease: Secondary | ICD-10-CM | POA: Diagnosis not present

## 2019-08-18 DIAGNOSIS — Z125 Encounter for screening for malignant neoplasm of prostate: Secondary | ICD-10-CM | POA: Diagnosis not present

## 2019-08-18 DIAGNOSIS — Z0000 Encounter for general adult medical examination without abnormal findings: Secondary | ICD-10-CM | POA: Diagnosis not present

## 2019-08-23 DIAGNOSIS — R82998 Other abnormal findings in urine: Secondary | ICD-10-CM | POA: Diagnosis not present

## 2019-08-25 DIAGNOSIS — H9319 Tinnitus, unspecified ear: Secondary | ICD-10-CM | POA: Diagnosis not present

## 2019-08-25 DIAGNOSIS — H269 Unspecified cataract: Secondary | ICD-10-CM | POA: Diagnosis not present

## 2019-08-25 DIAGNOSIS — Z0000 Encounter for general adult medical examination without abnormal findings: Secondary | ICD-10-CM | POA: Diagnosis not present

## 2019-08-25 DIAGNOSIS — J309 Allergic rhinitis, unspecified: Secondary | ICD-10-CM | POA: Diagnosis not present

## 2019-08-25 DIAGNOSIS — D751 Secondary polycythemia: Secondary | ICD-10-CM | POA: Diagnosis not present

## 2019-09-01 DIAGNOSIS — Z23 Encounter for immunization: Secondary | ICD-10-CM | POA: Diagnosis not present

## 2019-09-02 DIAGNOSIS — Z1212 Encounter for screening for malignant neoplasm of rectum: Secondary | ICD-10-CM | POA: Diagnosis not present

## 2019-09-14 DIAGNOSIS — H9312 Tinnitus, left ear: Secondary | ICD-10-CM | POA: Diagnosis not present

## 2019-09-14 DIAGNOSIS — H9192 Unspecified hearing loss, left ear: Secondary | ICD-10-CM | POA: Diagnosis not present

## 2019-09-14 DIAGNOSIS — H9319 Tinnitus, unspecified ear: Secondary | ICD-10-CM | POA: Diagnosis not present

## 2019-09-14 DIAGNOSIS — H9313 Tinnitus, bilateral: Secondary | ICD-10-CM | POA: Diagnosis not present

## 2019-09-14 DIAGNOSIS — H903 Sensorineural hearing loss, bilateral: Secondary | ICD-10-CM | POA: Diagnosis not present

## 2019-09-15 ENCOUNTER — Inpatient Hospital Stay: Payer: BC Managed Care – PPO | Attending: Hematology and Oncology

## 2019-09-15 ENCOUNTER — Other Ambulatory Visit: Payer: Self-pay

## 2019-09-15 ENCOUNTER — Inpatient Hospital Stay: Payer: BC Managed Care – PPO

## 2019-09-15 VITALS — BP 92/76 | HR 83 | Temp 98.1°F | Resp 16

## 2019-09-15 DIAGNOSIS — D751 Secondary polycythemia: Secondary | ICD-10-CM | POA: Insufficient documentation

## 2019-09-15 LAB — CBC WITH DIFFERENTIAL/PLATELET
Abs Immature Granulocytes: 0.04 10*3/uL (ref 0.00–0.07)
Basophils Absolute: 0.1 10*3/uL (ref 0.0–0.1)
Basophils Relative: 0 %
Eosinophils Absolute: 0.2 10*3/uL (ref 0.0–0.5)
Eosinophils Relative: 2 %
HCT: 50.7 % (ref 39.0–52.0)
Hemoglobin: 16.5 g/dL (ref 13.0–17.0)
Immature Granulocytes: 0 %
Lymphocytes Relative: 22 %
Lymphs Abs: 2.5 10*3/uL (ref 0.7–4.0)
MCH: 28.5 pg (ref 26.0–34.0)
MCHC: 32.5 g/dL (ref 30.0–36.0)
MCV: 87.6 fL (ref 80.0–100.0)
Monocytes Absolute: 1.1 10*3/uL — ABNORMAL HIGH (ref 0.1–1.0)
Monocytes Relative: 9 %
Neutro Abs: 7.7 10*3/uL (ref 1.7–7.7)
Neutrophils Relative %: 67 %
Platelets: 217 10*3/uL (ref 150–400)
RBC: 5.79 MIL/uL (ref 4.22–5.81)
RDW: 15.3 % (ref 11.5–15.5)
WBC: 11.6 10*3/uL — ABNORMAL HIGH (ref 4.0–10.5)
nRBC: 0 % (ref 0.0–0.2)

## 2019-09-15 NOTE — Patient Instructions (Signed)
Therapeutic Phlebotomy, Care After This sheet gives you information about how to care for yourself after your procedure. Your health care provider may also give you more specific instructions. If you have problems or questions, contact your health care provider. What can I expect after the procedure? After the procedure, it is common to have:  Light-headedness or dizziness. You may feel faint.  Nausea.  Tiredness (fatigue). Follow these instructions at home: Eating and drinking  Be sure to eat well-balanced meals for the next 24 hours.  Drink enough fluid to keep your urine pale yellow.  Avoid drinking alcohol on the day that you had the procedure. Activity   Return to your normal activities as told by your health care provider. Most people can go back to their normal activities right away.  Avoid activities that take a lot of effort for about 5 hours after the procedure. Athletes should avoid strenuous exercise for at least 12 hours.  Avoid heavy lifting or pulling for about 5 hours after the procedure. Do not lift anything that is heavier than 10 lb (4.5 kg).  Change positions slowly for the remainder of the day. This will help to prevent light-headedness or fainting.  If you feel light-headed, lie down until the feeling goes away. Needle insertion site care   Keep your bandage (dressing) dry. You can remove the bandage after about 5 hours or as told by your health care provider.  If you have bleeding from the needle insertion site, raise (elevate) your arm and press firmly on the site until the bleeding stops.  If you have bruising at the site, apply ice to the area: ? Remove the dressing. ? Put ice in a plastic bag. ? Place a towel between your skin and the bag. ? Leave the ice on for 20 minutes, 2-3 times a day for the first 24 hours.  If the swelling does not go away after 24 hours, apply a warm, moist cloth (warm compress) to the area for 20 minutes, 2-3 times a day.  General instructions  Do not use any products that contain nicotine or tobacco, such as cigarettes and e-cigarettes, for at least 30 minutes after the procedure.  Keep all follow-up visits as told by your health care provider. This is important. You may need to continue having regular therapeutic phlebotomy treatments as directed. Contact a health care provider if you:  Have redness, swelling, or pain at the needle insertion site.  Have fluid or blood coming from the needle insertion site.  Have pus or a bad smell coming from the needle insertion site.  Notice that the needle insertion site feels warm to the touch.  Feel light-headed, dizzy, or nauseous, and the feeling does not go away.  Have new bruising at the needle insertion site.  Feel weaker than normal.  Have a fever or chills. Get help right away if:  You faint.  You have chest pain.  You have trouble breathing.  You have severe nausea or vomiting. Summary  After the procedure, it is common to have some light-headedness, dizziness, nausea, or tiredness (fatigue).  Be sure to eat well-balanced meals for the next 24 hours. Drink enough fluid to keep your urine pale yellow.  Return to your normal activities as told by your health care provider.  Keep all follow-up visits as told by your health care provider. You may need to continue having regular therapeutic phlebotomy treatments as directed. This information is not intended to replace advice given to you   by your health care provider. Make sure you discuss any questions you have with your health care provider. Document Released: 04/14/2011 Document Revised: 11/27/2017 Document Reviewed: 11/26/2017 Elsevier Patient Education  2020 [employer]  Coronavirus (COVID-19) Are you at risk?  Are you at risk for the Coronavirus (COVID-19)?  To be considered HIGH RISK for Coronavirus (COVID-19), you have to meet the following criteria:  . Traveled to [place], [place],  [place], [place] or [place]; or in the [place] to [place], [place], [place], or [place]; and have fever, cough, and shortness of breath within the last 2 weeks of travel OR . Been in close contact with a person diagnosed with COVID-19 within the last 2 weeks and have fever, cough, and shortness of breath . IF YOU DO NOT MEET THESE CRITERIA, YOU ARE CONSIDERED LOW RISK FOR COVID-19.  What to do if you are HIGH RISK for COVID-19?  . If you are having a medical emergency, call 911. . Seek medical care right away. Before you go to a doctor's office, urgent care or emergency department, call ahead and tell them about your recent travel, contact with someone diagnosed with COVID-19, and your symptoms. You should receive instructions from your physician's office regarding next steps of care.  . When you arrive at healthcare provider, tell the healthcare staff immediately you have returned from visiting [place], [place], [place], [place] or [place]; or traveled in the [place] to [place], [place], [place], or [place]; in the last two weeks or you have been in close contact with a person diagnosed with COVID-19 in the last 2 weeks.   . Tell the health care staff about your symptoms: fever, cough and shortness of breath. . After you have been seen by a medical provider, you will be either: o Tested for (COVID-19) and discharged home on quarantine except to seek medical care if symptoms worsen, and asked to  - Stay home and avoid contact with others until you get your results (4-5 days)  - Avoid travel on public transportation if possible (such as bus, train, or airplane) or o Sent to the Emergency Department by EMS for evaluation, COVID-19 testing, and possible admission depending on your condition and test results.  What to do if you are LOW RISK for COVID-19?  Reduce your risk of any infection by using the same precautions used for avoiding the common cold or flu:  . Wash your  hands often with soap and warm water for at least 20 seconds.  If soap and water are not readily available, use an alcohol-based hand sanitizer with at least 60% alcohol.  . If coughing or sneezing, cover your mouth and nose by coughing or sneezing into the elbow areas of your shirt or coat, into a tissue or into your sleeve (not your hands). . Avoid shaking hands with others and consider head nods or verbal greetings only. . Avoid touching your eyes, nose, or mouth with unwashed hands.  . Avoid close contact with people who are sick. . Avoid places or events with large numbers of people in one location, like concerts or sporting events. . Carefully consider travel plans you have or are making. . If you are planning any travel outside or inside the [place], visit the CDC's Travelers' Health webpage for the latest health notices. . If you have some symptoms but not all symptoms, continue to monitor at home and seek medical attention if your symptoms worsen. . If you are   having a medical emergency, call 911.   [place] / e-Visit: [email or web address]         [facility]: [facility]: [phone]                   [facility]: [phone]

## 2019-10-19 DIAGNOSIS — Z03818 Encounter for observation for suspected exposure to other biological agents ruled out: Secondary | ICD-10-CM | POA: Diagnosis not present

## 2019-11-10 ENCOUNTER — Other Ambulatory Visit: Payer: Self-pay

## 2019-11-10 ENCOUNTER — Inpatient Hospital Stay: Payer: BC Managed Care – PPO | Attending: Hematology and Oncology

## 2019-11-10 ENCOUNTER — Inpatient Hospital Stay: Payer: BC Managed Care – PPO

## 2019-11-10 VITALS — BP 114/82 | HR 87 | Temp 98.7°F | Resp 16

## 2019-11-10 DIAGNOSIS — D751 Secondary polycythemia: Secondary | ICD-10-CM

## 2019-11-10 LAB — CBC WITH DIFFERENTIAL/PLATELET
Abs Immature Granulocytes: 0.03 10*3/uL (ref 0.00–0.07)
Basophils Absolute: 0.1 10*3/uL (ref 0.0–0.1)
Basophils Relative: 1 %
Eosinophils Absolute: 0.2 10*3/uL (ref 0.0–0.5)
Eosinophils Relative: 2 %
HCT: 50.2 % (ref 39.0–52.0)
Hemoglobin: 15.7 g/dL (ref 13.0–17.0)
Immature Granulocytes: 0 %
Lymphocytes Relative: 30 %
Lymphs Abs: 2.7 10*3/uL (ref 0.7–4.0)
MCH: 27.7 pg (ref 26.0–34.0)
MCHC: 31.3 g/dL (ref 30.0–36.0)
MCV: 88.5 fL (ref 80.0–100.0)
Monocytes Absolute: 0.9 10*3/uL (ref 0.1–1.0)
Monocytes Relative: 10 %
Neutro Abs: 5.3 10*3/uL (ref 1.7–7.7)
Neutrophils Relative %: 57 %
Platelets: 198 10*3/uL (ref 150–400)
RBC: 5.67 MIL/uL (ref 4.22–5.81)
RDW: 14.9 % (ref 11.5–15.5)
WBC: 9.2 10*3/uL (ref 4.0–10.5)
nRBC: 0 % (ref 0.0–0.2)

## 2019-11-10 NOTE — Progress Notes (Signed)
Chris Long presents today for phlebotomy per MD orders. 16g phlebotomy needle inserted into LAC. Phlebotomy procedure started at 0855 and ended at 0900. 547 grams removed. IV needle removed intact. Patient observed for 30 minutes after procedure without any incident. Patient tolerated procedure well.

## 2019-11-10 NOTE — Patient Instructions (Signed)

## 2019-12-30 DIAGNOSIS — E669 Obesity, unspecified: Secondary | ICD-10-CM | POA: Diagnosis not present

## 2019-12-30 DIAGNOSIS — F17210 Nicotine dependence, cigarettes, uncomplicated: Secondary | ICD-10-CM | POA: Diagnosis not present

## 2019-12-30 DIAGNOSIS — J309 Allergic rhinitis, unspecified: Secondary | ICD-10-CM | POA: Diagnosis not present

## 2019-12-30 DIAGNOSIS — D751 Secondary polycythemia: Secondary | ICD-10-CM | POA: Diagnosis not present

## 2020-01-05 ENCOUNTER — Inpatient Hospital Stay: Payer: BC Managed Care – PPO

## 2020-01-05 ENCOUNTER — Other Ambulatory Visit: Payer: Self-pay

## 2020-01-05 ENCOUNTER — Inpatient Hospital Stay: Payer: BC Managed Care – PPO | Attending: Hematology and Oncology

## 2020-01-05 DIAGNOSIS — D751 Secondary polycythemia: Secondary | ICD-10-CM | POA: Insufficient documentation

## 2020-01-05 LAB — CBC WITH DIFFERENTIAL/PLATELET
Abs Immature Granulocytes: 0.02 10*3/uL (ref 0.00–0.07)
Basophils Absolute: 0.1 10*3/uL (ref 0.0–0.1)
Basophils Relative: 1 %
Eosinophils Absolute: 0.2 10*3/uL (ref 0.0–0.5)
Eosinophils Relative: 2 %
HCT: 50.6 % (ref 39.0–52.0)
Hemoglobin: 15.8 g/dL (ref 13.0–17.0)
Immature Granulocytes: 0 %
Lymphocytes Relative: 29 %
Lymphs Abs: 2.5 10*3/uL (ref 0.7–4.0)
MCH: 27 pg (ref 26.0–34.0)
MCHC: 31.2 g/dL (ref 30.0–36.0)
MCV: 86.5 fL (ref 80.0–100.0)
Monocytes Absolute: 1 10*3/uL (ref 0.1–1.0)
Monocytes Relative: 11 %
Neutro Abs: 4.8 10*3/uL (ref 1.7–7.7)
Neutrophils Relative %: 57 %
Platelets: 212 10*3/uL (ref 150–400)
RBC: 5.85 MIL/uL — ABNORMAL HIGH (ref 4.22–5.81)
RDW: 15 % (ref 11.5–15.5)
WBC: 8.5 10*3/uL (ref 4.0–10.5)
nRBC: 0 % (ref 0.0–0.2)

## 2020-01-05 NOTE — Progress Notes (Signed)
Patient presented today for phlebotomy per MD order. A 16 gauge needle was placed in the Right AC and 54mls of blood was removed. The needle was removed intact. The patient stayed 30 post procedure. Food and beverage provided. Patient discharged in stable condition with no complaints.

## 2020-01-05 NOTE — Patient Instructions (Signed)

## 2020-01-19 DIAGNOSIS — Z720 Tobacco use: Secondary | ICD-10-CM | POA: Diagnosis not present

## 2020-01-21 DIAGNOSIS — E118 Type 2 diabetes mellitus with unspecified complications: Secondary | ICD-10-CM | POA: Diagnosis not present

## 2020-03-01 ENCOUNTER — Inpatient Hospital Stay: Payer: BC Managed Care – PPO

## 2020-03-01 ENCOUNTER — Other Ambulatory Visit: Payer: Self-pay

## 2020-03-01 ENCOUNTER — Inpatient Hospital Stay: Payer: BC Managed Care – PPO | Attending: Hematology and Oncology

## 2020-03-01 DIAGNOSIS — D751 Secondary polycythemia: Secondary | ICD-10-CM | POA: Diagnosis not present

## 2020-03-01 LAB — CBC WITH DIFFERENTIAL/PLATELET
Abs Immature Granulocytes: 0.02 10*3/uL (ref 0.00–0.07)
Basophils Absolute: 0.1 10*3/uL (ref 0.0–0.1)
Basophils Relative: 1 %
Eosinophils Absolute: 0.2 10*3/uL (ref 0.0–0.5)
Eosinophils Relative: 2 %
HCT: 48.1 % (ref 39.0–52.0)
Hemoglobin: 15.3 g/dL (ref 13.0–17.0)
Immature Granulocytes: 0 %
Lymphocytes Relative: 25 %
Lymphs Abs: 2.1 10*3/uL (ref 0.7–4.0)
MCH: 27.1 pg (ref 26.0–34.0)
MCHC: 31.8 g/dL (ref 30.0–36.0)
MCV: 85.1 fL (ref 80.0–100.0)
Monocytes Absolute: 0.9 10*3/uL (ref 0.1–1.0)
Monocytes Relative: 11 %
Neutro Abs: 5.1 10*3/uL (ref 1.7–7.7)
Neutrophils Relative %: 61 %
Platelets: 194 10*3/uL (ref 150–400)
RBC: 5.65 MIL/uL (ref 4.22–5.81)
RDW: 16.1 % — ABNORMAL HIGH (ref 11.5–15.5)
WBC: 8.4 10*3/uL (ref 4.0–10.5)
nRBC: 0 % (ref 0.0–0.2)

## 2020-03-01 NOTE — Progress Notes (Signed)
Phlebotomy started at 0910, with a 18G needle in the L AC. Phlebotomy ended at 0920, with 518g removed. Pt tolerated well. Nutrition offered and received. Pt agreed to stay for 35min observation.

## 2020-03-21 DIAGNOSIS — E118 Type 2 diabetes mellitus with unspecified complications: Secondary | ICD-10-CM | POA: Diagnosis not present

## 2020-04-20 DIAGNOSIS — E118 Type 2 diabetes mellitus with unspecified complications: Secondary | ICD-10-CM | POA: Diagnosis not present

## 2020-04-26 ENCOUNTER — Other Ambulatory Visit: Payer: BC Managed Care – PPO

## 2020-04-26 ENCOUNTER — Ambulatory Visit: Payer: BC Managed Care – PPO

## 2020-04-26 DIAGNOSIS — E1122 Type 2 diabetes mellitus with diabetic chronic kidney disease: Secondary | ICD-10-CM | POA: Diagnosis not present

## 2020-04-26 DIAGNOSIS — I130 Hypertensive heart and chronic kidney disease with heart failure and stage 1 through stage 4 chronic kidney disease, or unspecified chronic kidney disease: Secondary | ICD-10-CM | POA: Diagnosis not present

## 2020-04-26 DIAGNOSIS — E7849 Other hyperlipidemia: Secondary | ICD-10-CM | POA: Diagnosis not present

## 2020-04-26 DIAGNOSIS — E039 Hypothyroidism, unspecified: Secondary | ICD-10-CM | POA: Diagnosis not present

## 2020-04-26 DIAGNOSIS — J449 Chronic obstructive pulmonary disease, unspecified: Secondary | ICD-10-CM | POA: Diagnosis not present

## 2020-04-26 DIAGNOSIS — N182 Chronic kidney disease, stage 2 (mild): Secondary | ICD-10-CM | POA: Diagnosis not present

## 2020-05-10 ENCOUNTER — Telehealth: Payer: Self-pay | Admitting: Hematology and Oncology

## 2020-05-10 NOTE — Telephone Encounter (Signed)
Scheduled per 6/15 sch message. Pt aware of appts. R/s per MD. Provider on PAL.

## 2020-06-21 ENCOUNTER — Ambulatory Visit: Payer: BC Managed Care – PPO | Admitting: Hematology and Oncology

## 2020-06-21 ENCOUNTER — Other Ambulatory Visit: Payer: BC Managed Care – PPO

## 2020-06-26 ENCOUNTER — Inpatient Hospital Stay: Payer: BC Managed Care – PPO | Admitting: Hematology and Oncology

## 2020-06-26 ENCOUNTER — Inpatient Hospital Stay: Payer: BC Managed Care – PPO

## 2020-06-26 ENCOUNTER — Other Ambulatory Visit: Payer: Self-pay

## 2020-06-26 ENCOUNTER — Encounter: Payer: Self-pay | Admitting: Hematology and Oncology

## 2020-06-26 ENCOUNTER — Telehealth: Payer: Self-pay | Admitting: Hematology and Oncology

## 2020-06-26 ENCOUNTER — Inpatient Hospital Stay: Payer: BC Managed Care – PPO | Attending: Hematology and Oncology

## 2020-06-26 ENCOUNTER — Other Ambulatory Visit: Payer: Self-pay | Admitting: Hematology and Oncology

## 2020-06-26 DIAGNOSIS — E663 Overweight: Secondary | ICD-10-CM

## 2020-06-26 DIAGNOSIS — Z720 Tobacco use: Secondary | ICD-10-CM

## 2020-06-26 DIAGNOSIS — D751 Secondary polycythemia: Secondary | ICD-10-CM | POA: Diagnosis not present

## 2020-06-26 DIAGNOSIS — Z6825 Body mass index (BMI) 25.0-25.9, adult: Secondary | ICD-10-CM | POA: Insufficient documentation

## 2020-06-26 DIAGNOSIS — F17210 Nicotine dependence, cigarettes, uncomplicated: Secondary | ICD-10-CM | POA: Diagnosis not present

## 2020-06-26 LAB — CBC WITH DIFFERENTIAL/PLATELET
Abs Immature Granulocytes: 0.03 10*3/uL (ref 0.00–0.07)
Basophils Absolute: 0.1 10*3/uL (ref 0.0–0.1)
Basophils Relative: 1 %
Eosinophils Absolute: 0.2 10*3/uL (ref 0.0–0.5)
Eosinophils Relative: 2 %
HCT: 52.6 % — ABNORMAL HIGH (ref 39.0–52.0)
Hemoglobin: 16.6 g/dL (ref 13.0–17.0)
Immature Granulocytes: 0 %
Lymphocytes Relative: 25 %
Lymphs Abs: 2.1 10*3/uL (ref 0.7–4.0)
MCH: 26.6 pg (ref 26.0–34.0)
MCHC: 31.6 g/dL (ref 30.0–36.0)
MCV: 84.2 fL (ref 80.0–100.0)
Monocytes Absolute: 0.9 10*3/uL (ref 0.1–1.0)
Monocytes Relative: 11 %
Neutro Abs: 5.4 10*3/uL (ref 1.7–7.7)
Neutrophils Relative %: 61 %
Platelets: 215 10*3/uL (ref 150–400)
RBC: 6.25 MIL/uL — ABNORMAL HIGH (ref 4.22–5.81)
RDW: 17.7 % — ABNORMAL HIGH (ref 11.5–15.5)
WBC: 8.7 10*3/uL (ref 4.0–10.5)
nRBC: 0 % (ref 0.0–0.2)

## 2020-06-26 NOTE — Assessment & Plan Note (Signed)
We discussed various strategies to help him quit smoking I also encourage lifestyle modification, dietary change and weight loss as tolerated 

## 2020-06-26 NOTE — Assessment & Plan Note (Signed)
The patient has not quit smoking yet but he is motivated. 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

## 2020-06-26 NOTE — Telephone Encounter (Signed)
Scheduled appts per 8/3 sch msg. Pt declined print out of AVS and state he would refer to mychart.

## 2020-06-26 NOTE — Patient Instructions (Signed)

## 2020-06-26 NOTE — Progress Notes (Signed)
Patient presented for phlebotomy today per MD order. A 16 gauge needle was placed in the LAC. 52ml blood removed and the needle was removed fully intact.  The patient refused the 30 min post observation. Beverage refused.  Vital signs stable at discharge.

## 2020-06-26 NOTE — Progress Notes (Signed)
[place] OFFICE PROGRESS NOTE  [name], MD  ASSESSMENT & PLAN:  Polycythemia The patient has not quit smoking yet but he is motivated. 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  Overweight (BMI 25.0-29.9) He has impressive weight loss since last time I saw him and will continue on his weight loss program to target goal weight around 195 pounds I congratulated his efforts and encouraged him to continue his best effort I suspect the weight loss will improve his overall outcome and could potentially eliminate the component of OSA that contributed to secondary polycythemia  Tobacco abuse We discussed various strategies to help him quit smoking I also encourage lifestyle modification, dietary change and weight loss as tolerated   No orders of the defined types were placed in this encounter.   The total time spent in the appointment was 15 minutes encounter with patients including review of chart and various tests results, discussions about plan of care and coordination of care plan   All questions were answered. The patient knows to call the clinic with any problems, questions or concerns. No barriers to learning was detected.    Heath Lark, MD 8/3/20219:27 AM  INTERVAL HISTORY: TOBENNA NEEDS 59 y.o. 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 Peripheral blood for JAK 2 mutation was negative. The patient was advised to stop smoking. He also underwent sleep study and was told he had mild obstructive sleep apnea but declined treatment for now. Since 2015 he had multiple phlebotomy sessions. CT lung screening in April 2017 was negative  I have reviewed the past medical history, past surgical history, social history and family history with the patient and they are unchanged from previous note.  ALLERGIES:  has No Known Allergies.  MEDICATIONS:  Current Outpatient Medications  Medication Sig Dispense Refill   lisinopril (PRINIVIL,ZESTRIL) 10 MG tablet Take 5 mg by mouth daily.  3   aspirin 325 MG tablet Take 325 mg by mouth daily.     levothyroxine (SYNTHROID, LEVOTHROID) 88 MCG tablet Take 88 mcg by mouth daily.     metFORMIN (GLUCOPHAGE-XR) 500 MG 24 hr tablet Take 500 mg by mouth daily.  6   metoprolol (LOPRESSOR) 50 MG tablet Take 0.5 tablets (25 mg total) by mouth 2 (two) times daily. 90 tablet 3   rosuvastatin (CRESTOR) 10 MG tablet Take 20 mg by mouth daily.      No current facility-administered medications for this visit.     REVIEW OF SYSTEMS:   Constitutional: Denies fevers, chills or night sweats Eyes: Denies blurriness of vision Ears, nose, mouth, throat, and face: Denies mucositis or sore throat Respiratory: Denies cough, dyspnea or wheezes Cardiovascular: Denies palpitation, chest discomfort  or lower extremity swelling Gastrointestinal:  Denies nausea, heartburn or change in bowel habits Skin: Denies abnormal skin rashes Lymphatics: Denies new lymphadenopathy or easy bruising Neurological:Denies numbness, tingling or new weaknesses Behavioral/Psych: Mood is stable, no new changes  All other systems were reviewed with the patient and are  negative.  PHYSICAL EXAMINATION: ECOG PERFORMANCE STATUS: 0 - Asymptomatic  Vitals:   06/26/20 0854  BP: 121/75  Pulse: 80  Resp: 18  Temp: 98 F (36.7 C)  SpO2: 99%   Filed Weights   06/26/20 0854  Weight: 206 lb 12.8 oz (93.8 kg)    GENERAL:alert, no distress and comfortable NEURO: alert & oriented x 3 with fluent speech, no focal motor/sensory deficits  LABORATORY DATA:  I have reviewed the data as listed     Component Value Date/Time   NA 139 09/13/2014 1315   K 4.7 09/13/2014 1315   CL 102 09/13/2014 1315   CO2 23 09/13/2014 1315   GLUCOSE 114 (H) 09/13/2014 1315   BUN 15 09/13/2014 1315   CREATININE 0.90 09/13/2014 1315   CALCIUM 9.7 09/13/2014 1315   PROT 7.2 09/13/2014 1315   ALBUMIN 3.9 09/13/2014 1315   AST 21 09/13/2014 1315   ALT 32 09/13/2014 1315   ALKPHOS 60 09/13/2014 1315   BILITOT 0.3 09/13/2014 1315   GFRNONAA >90 09/13/2014 1315   GFRAA >90 09/13/2014 1315    No results found for: SPEP, UPEP  Lab Results  Component Value Date   WBC 8.7 06/26/2020   NEUTROABS 5.4 06/26/2020   HGB 16.6 06/26/2020   HCT 52.6 (H) 06/26/2020   MCV 84.2 06/26/2020   PLT 215 06/26/2020      Chemistry      Component Value Date/Time   NA 139 09/13/2014 1315   K 4.7 09/13/2014 1315   CL 102 09/13/2014 1315   CO2 23 09/13/2014 1315   BUN 15 09/13/2014 1315   CREATININE 0.90 09/13/2014 1315      Component Value Date/Time   CALCIUM 9.7 09/13/2014 1315   ALKPHOS 60 09/13/2014 1315   AST 21 09/13/2014 1315   ALT 32 09/13/2014 1315   BILITOT 0.3 09/13/2014 1315

## 2020-06-26 NOTE — Assessment & Plan Note (Signed)
He has impressive weight loss since last time I saw him and will continue on his weight loss program to target goal weight around 195 pounds I congratulated his efforts and encouraged him to continue his best effort I suspect the weight loss will improve his overall outcome and could potentially eliminate the component of OSA that contributed to secondary polycythemia

## 2020-08-20 DIAGNOSIS — Z0000 Encounter for general adult medical examination without abnormal findings: Secondary | ICD-10-CM | POA: Diagnosis not present

## 2020-08-20 DIAGNOSIS — E785 Hyperlipidemia, unspecified: Secondary | ICD-10-CM | POA: Diagnosis not present

## 2020-08-20 DIAGNOSIS — Z125 Encounter for screening for malignant neoplasm of prostate: Secondary | ICD-10-CM | POA: Diagnosis not present

## 2020-08-20 DIAGNOSIS — E039 Hypothyroidism, unspecified: Secondary | ICD-10-CM | POA: Diagnosis not present

## 2020-08-20 DIAGNOSIS — E1122 Type 2 diabetes mellitus with diabetic chronic kidney disease: Secondary | ICD-10-CM | POA: Diagnosis not present

## 2020-08-21 DIAGNOSIS — E118 Type 2 diabetes mellitus with unspecified complications: Secondary | ICD-10-CM | POA: Diagnosis not present

## 2020-08-27 DIAGNOSIS — E1122 Type 2 diabetes mellitus with diabetic chronic kidney disease: Secondary | ICD-10-CM | POA: Diagnosis not present

## 2020-08-27 DIAGNOSIS — R82998 Other abnormal findings in urine: Secondary | ICD-10-CM | POA: Diagnosis not present

## 2020-08-27 DIAGNOSIS — Z23 Encounter for immunization: Secondary | ICD-10-CM | POA: Diagnosis not present

## 2020-08-27 DIAGNOSIS — Z0000 Encounter for general adult medical examination without abnormal findings: Secondary | ICD-10-CM | POA: Diagnosis not present

## 2020-08-28 ENCOUNTER — Inpatient Hospital Stay: Payer: BC Managed Care – PPO

## 2020-08-28 ENCOUNTER — Inpatient Hospital Stay: Payer: BC Managed Care – PPO | Attending: Hematology and Oncology

## 2020-08-28 ENCOUNTER — Other Ambulatory Visit: Payer: Self-pay

## 2020-08-28 DIAGNOSIS — D751 Secondary polycythemia: Secondary | ICD-10-CM | POA: Insufficient documentation

## 2020-08-28 LAB — CBC WITH DIFFERENTIAL/PLATELET
Abs Immature Granulocytes: 0.07 10*3/uL (ref 0.00–0.07)
Basophils Absolute: 0.1 10*3/uL (ref 0.0–0.1)
Basophils Relative: 1 %
Eosinophils Absolute: 0.2 10*3/uL (ref 0.0–0.5)
Eosinophils Relative: 2 %
HCT: 49.8 % (ref 39.0–52.0)
Hemoglobin: 16 g/dL (ref 13.0–17.0)
Immature Granulocytes: 1 %
Lymphocytes Relative: 29 %
Lymphs Abs: 2.7 10*3/uL (ref 0.7–4.0)
MCH: 28.2 pg (ref 26.0–34.0)
MCHC: 32.1 g/dL (ref 30.0–36.0)
MCV: 87.7 fL (ref 80.0–100.0)
Monocytes Absolute: 1.1 10*3/uL — ABNORMAL HIGH (ref 0.1–1.0)
Monocytes Relative: 11 %
Neutro Abs: 5.2 10*3/uL (ref 1.7–7.7)
Neutrophils Relative %: 56 %
Platelets: 202 10*3/uL (ref 150–400)
RBC: 5.68 MIL/uL (ref 4.22–5.81)
RDW: 16.6 % — ABNORMAL HIGH (ref 11.5–15.5)
WBC: 9.4 10*3/uL (ref 4.0–10.5)
nRBC: 0 % (ref 0.0–0.2)

## 2020-08-28 NOTE — Progress Notes (Signed)
Chris Long presents today for phlebotomy per MD orders. Phlebotomy procedure started at 1515 and ended at 1530. 508 grams removed. Patient observed for 30 minutes after procedure without any incident. Patient offered food and drink. Patient declined any food at this time.  18 G IV needle to LAC removed. IV intact with no noted complications. Patient tolerated procedure well.

## 2020-08-28 NOTE — Patient Instructions (Signed)

## 2020-10-30 ENCOUNTER — Inpatient Hospital Stay: Payer: BC Managed Care – PPO | Attending: Hematology and Oncology

## 2020-10-30 ENCOUNTER — Inpatient Hospital Stay: Payer: BC Managed Care – PPO

## 2020-10-30 ENCOUNTER — Other Ambulatory Visit: Payer: Self-pay

## 2020-10-30 VITALS — BP 121/72 | HR 79 | Temp 98.3°F | Resp 18

## 2020-10-30 DIAGNOSIS — D751 Secondary polycythemia: Secondary | ICD-10-CM | POA: Diagnosis not present

## 2020-10-30 LAB — CBC WITH DIFFERENTIAL/PLATELET
Abs Immature Granulocytes: 0.03 10*3/uL (ref 0.00–0.07)
Basophils Absolute: 0.1 10*3/uL (ref 0.0–0.1)
Basophils Relative: 1 %
Eosinophils Absolute: 0.3 10*3/uL (ref 0.0–0.5)
Eosinophils Relative: 3 %
HCT: 49 % (ref 39.0–52.0)
Hemoglobin: 15.7 g/dL (ref 13.0–17.0)
Immature Granulocytes: 0 %
Lymphocytes Relative: 36 %
Lymphs Abs: 3.6 10*3/uL (ref 0.7–4.0)
MCH: 27.4 pg (ref 26.0–34.0)
MCHC: 32 g/dL (ref 30.0–36.0)
MCV: 85.5 fL (ref 80.0–100.0)
Monocytes Absolute: 1.3 10*3/uL — ABNORMAL HIGH (ref 0.1–1.0)
Monocytes Relative: 13 %
Neutro Abs: 4.8 10*3/uL (ref 1.7–7.7)
Neutrophils Relative %: 47 %
Platelets: 218 10*3/uL (ref 150–400)
RBC: 5.73 MIL/uL (ref 4.22–5.81)
RDW: 16 % — ABNORMAL HIGH (ref 11.5–15.5)
WBC: 10 10*3/uL (ref 4.0–10.5)
nRBC: 0 % (ref 0.0–0.2)

## 2020-10-30 NOTE — Progress Notes (Signed)
Therapeutic phlebotomy performed per MD orders in LAC using 18g angiocath. Procedure started at 1512 and ended at 1524, removing 514ml of blood. Pt tolerated procedure well and remained 30 minutes post-procedure for observation without incident. PO fluids provided. Vital signs stable upon discharge.

## 2020-10-30 NOTE — Patient Instructions (Signed)

## 2020-12-25 ENCOUNTER — Other Ambulatory Visit: Payer: Self-pay

## 2020-12-25 ENCOUNTER — Inpatient Hospital Stay: Payer: BC Managed Care – PPO

## 2020-12-25 ENCOUNTER — Inpatient Hospital Stay: Payer: BC Managed Care – PPO | Attending: Hematology and Oncology

## 2020-12-25 VITALS — BP 111/79 | HR 92 | Temp 98.6°F | Resp 18

## 2020-12-25 DIAGNOSIS — E1122 Type 2 diabetes mellitus with diabetic chronic kidney disease: Secondary | ICD-10-CM | POA: Diagnosis not present

## 2020-12-25 DIAGNOSIS — D751 Secondary polycythemia: Secondary | ICD-10-CM

## 2020-12-25 DIAGNOSIS — I130 Hypertensive heart and chronic kidney disease with heart failure and stage 1 through stage 4 chronic kidney disease, or unspecified chronic kidney disease: Secondary | ICD-10-CM | POA: Diagnosis not present

## 2020-12-25 LAB — CBC WITH DIFFERENTIAL/PLATELET
Abs Immature Granulocytes: 0.05 10*3/uL (ref 0.00–0.07)
Basophils Absolute: 0.1 10*3/uL (ref 0.0–0.1)
Basophils Relative: 1 %
Eosinophils Absolute: 0.1 10*3/uL (ref 0.0–0.5)
Eosinophils Relative: 2 %
HCT: 48.8 % (ref 39.0–52.0)
Hemoglobin: 15.8 g/dL (ref 13.0–17.0)
Immature Granulocytes: 1 %
Lymphocytes Relative: 27 %
Lymphs Abs: 2.6 10*3/uL (ref 0.7–4.0)
MCH: 27.6 pg (ref 26.0–34.0)
MCHC: 32.4 g/dL (ref 30.0–36.0)
MCV: 85.2 fL (ref 80.0–100.0)
Monocytes Absolute: 1 10*3/uL (ref 0.1–1.0)
Monocytes Relative: 10 %
Neutro Abs: 5.8 10*3/uL (ref 1.7–7.7)
Neutrophils Relative %: 59 %
Platelets: 206 10*3/uL (ref 150–400)
RBC: 5.73 MIL/uL (ref 4.22–5.81)
RDW: 16.1 % — ABNORMAL HIGH (ref 11.5–15.5)
WBC: 9.6 10*3/uL (ref 4.0–10.5)
nRBC: 0 % (ref 0.0–0.2)

## 2020-12-25 NOTE — Progress Notes (Signed)
Chris Long presents today for phlebotomy per MD orders. Phlebotomy procedure started at 1449 and ended at 1458. 502 grams removed. Patient observed for 15 minutes after procedure without any incident. Patient declined to stay entire 30 minute post observation period. Patient offered food and drink. Patient declined any food at this time. Patient did accept a beverage.  16 G IV needle to [place] removed intact with no noted complications. Patient tolerated procedure well. Patient ambulatory to lobby without difficulty.

## 2020-12-25 NOTE — Patient Instructions (Signed)

## 2021-01-10 ENCOUNTER — Other Ambulatory Visit: Payer: Self-pay | Admitting: Internal Medicine

## 2021-01-10 ENCOUNTER — Ambulatory Visit
Admission: RE | Admit: 2021-01-10 | Discharge: 2021-01-10 | Disposition: A | Discharge status: Home / Self Care | Payer: BC Managed Care – PPO | Source: Ambulatory Visit | Attending: Internal Medicine | Admitting: Internal Medicine

## 2021-01-10 DIAGNOSIS — Z0389 Encounter for observation for other suspected diseases and conditions ruled out: Secondary | ICD-10-CM | POA: Diagnosis not present

## 2021-01-10 DIAGNOSIS — D3130 Benign neoplasm of unspecified choroid: Secondary | ICD-10-CM

## 2021-01-10 DIAGNOSIS — M545 Low back pain, unspecified: Secondary | ICD-10-CM | POA: Diagnosis not present

## 2021-01-10 DIAGNOSIS — M25552 Pain in left hip: Secondary | ICD-10-CM | POA: Diagnosis not present

## 2021-01-20 DIAGNOSIS — E118 Type 2 diabetes mellitus with unspecified complications: Secondary | ICD-10-CM | POA: Diagnosis not present

## 2021-02-19 DIAGNOSIS — M25552 Pain in left hip: Secondary | ICD-10-CM | POA: Diagnosis not present

## 2021-02-19 DIAGNOSIS — M545 Low back pain, unspecified: Secondary | ICD-10-CM | POA: Diagnosis not present

## 2021-02-22 ENCOUNTER — Other Ambulatory Visit: Payer: Self-pay

## 2021-02-22 DIAGNOSIS — D751 Secondary polycythemia: Secondary | ICD-10-CM

## 2021-02-26 ENCOUNTER — Inpatient Hospital Stay: Payer: BC Managed Care – PPO | Attending: Hematology and Oncology

## 2021-02-26 ENCOUNTER — Inpatient Hospital Stay: Payer: BC Managed Care – PPO

## 2021-02-26 ENCOUNTER — Other Ambulatory Visit: Payer: Self-pay

## 2021-02-26 VITALS — BP 116/69 | HR 84 | Temp 98.0°F | Resp 18

## 2021-02-26 DIAGNOSIS — D751 Secondary polycythemia: Secondary | ICD-10-CM

## 2021-02-26 LAB — CBC WITH DIFFERENTIAL (CANCER CENTER ONLY)
Abs Immature Granulocytes: 0.04 10*3/uL (ref 0.00–0.07)
Basophils Absolute: 0.1 10*3/uL (ref 0.0–0.1)
Basophils Relative: 1 %
Eosinophils Absolute: 0.2 10*3/uL (ref 0.0–0.5)
Eosinophils Relative: 3 %
HCT: 48.1 % (ref 39.0–52.0)
Hemoglobin: 15.5 g/dL (ref 13.0–17.0)
Immature Granulocytes: 0 %
Lymphocytes Relative: 32 %
Lymphs Abs: 2.8 10*3/uL (ref 0.7–4.0)
MCH: 27.8 pg (ref 26.0–34.0)
MCHC: 32.2 g/dL (ref 30.0–36.0)
MCV: 86.2 fL (ref 80.0–100.0)
Monocytes Absolute: 0.9 10*3/uL (ref 0.1–1.0)
Monocytes Relative: 10 %
Neutro Abs: 4.9 10*3/uL (ref 1.7–7.7)
Neutrophils Relative %: 54 %
Platelet Count: ADEQUATE 10*3/uL (ref 150–400)
RBC: 5.58 MIL/uL (ref 4.22–5.81)
RDW: 16.3 % — ABNORMAL HIGH (ref 11.5–15.5)
WBC Count: 8.9 10*3/uL (ref 4.0–10.5)
nRBC: 0 % (ref 0.0–0.2)

## 2021-02-26 NOTE — Progress Notes (Signed)
Chris Long presents today for phlebotomy per MD orders. Phlebotomy procedure started at 1507 with 16G in left AC and ended at 1517. 540 grams removed. Pt declined snack, water provided.  Patient declined to stay for 30 minute post-observation. Patient tolerated procedure well. IV needle removed intact.

## 2021-02-26 NOTE — Patient Instructions (Signed)

## 2021-02-26 NOTE — Progress Notes (Signed)
MD reviewed CBC results due to platelet clumps on smear.  MD recommendations for patient to proceed with phlebotomy today, 4/5, and we will cancel June phlebotomy due to hematocrit decreasing.  Infusion nurse notified and will update patient.

## 2021-04-02 DIAGNOSIS — M65331 Trigger finger, right middle finger: Secondary | ICD-10-CM | POA: Diagnosis not present

## 2021-04-29 ENCOUNTER — Other Ambulatory Visit: Payer: Self-pay | Admitting: Hematology and Oncology

## 2021-04-29 DIAGNOSIS — D751 Secondary polycythemia: Secondary | ICD-10-CM

## 2021-04-30 ENCOUNTER — Inpatient Hospital Stay: Payer: BC Managed Care – PPO | Attending: Hematology and Oncology

## 2021-04-30 ENCOUNTER — Inpatient Hospital Stay: Payer: BC Managed Care – PPO

## 2021-04-30 ENCOUNTER — Other Ambulatory Visit: Payer: Self-pay

## 2021-04-30 DIAGNOSIS — D751 Secondary polycythemia: Secondary | ICD-10-CM

## 2021-04-30 DIAGNOSIS — E1122 Type 2 diabetes mellitus with diabetic chronic kidney disease: Secondary | ICD-10-CM | POA: Diagnosis not present

## 2021-04-30 LAB — CBC WITH DIFFERENTIAL/PLATELET
Abs Immature Granulocytes: 0.03 10*3/uL (ref 0.00–0.07)
Basophils Absolute: 0.1 10*3/uL (ref 0.0–0.1)
Basophils Relative: 1 %
Eosinophils Absolute: 0.2 10*3/uL (ref 0.0–0.5)
Eosinophils Relative: 3 %
HCT: 46.8 % (ref 39.0–52.0)
Hemoglobin: 15.8 g/dL (ref 13.0–17.0)
Immature Granulocytes: 0 %
Lymphocytes Relative: 31 %
Lymphs Abs: 2.8 10*3/uL (ref 0.7–4.0)
MCH: 28.7 pg (ref 26.0–34.0)
MCHC: 33.8 g/dL (ref 30.0–36.0)
MCV: 85.1 fL (ref 80.0–100.0)
Monocytes Absolute: 1 10*3/uL (ref 0.1–1.0)
Monocytes Relative: 11 %
Neutro Abs: 4.9 10*3/uL (ref 1.7–7.7)
Neutrophils Relative %: 54 %
Platelets: 194 10*3/uL (ref 150–400)
RBC: 5.5 MIL/uL (ref 4.22–5.81)
RDW: 15.6 % — ABNORMAL HIGH (ref 11.5–15.5)
WBC: 9 10*3/uL (ref 4.0–10.5)
nRBC: 0 % (ref 0.0–0.2)

## 2021-04-30 NOTE — Progress Notes (Signed)
No phlebotomy today per MD Alvy Bimler. Pt verbalizes understanding and will call [facility] with any questions/concerns.

## 2021-05-21 DIAGNOSIS — E118 Type 2 diabetes mellitus with unspecified complications: Secondary | ICD-10-CM | POA: Diagnosis not present

## 2021-06-25 ENCOUNTER — Inpatient Hospital Stay: Payer: BC Managed Care – PPO | Admitting: Hematology and Oncology

## 2021-06-25 ENCOUNTER — Inpatient Hospital Stay: Payer: BC Managed Care – PPO

## 2021-06-25 ENCOUNTER — Inpatient Hospital Stay: Payer: BC Managed Care – PPO | Attending: Hematology and Oncology

## 2021-06-25 ENCOUNTER — Other Ambulatory Visit: Payer: Self-pay

## 2021-06-25 DIAGNOSIS — D751 Secondary polycythemia: Secondary | ICD-10-CM | POA: Insufficient documentation

## 2021-06-25 DIAGNOSIS — Z720 Tobacco use: Secondary | ICD-10-CM

## 2021-06-25 LAB — CBC WITH DIFFERENTIAL/PLATELET
Abs Immature Granulocytes: 0.05 10*3/uL (ref 0.00–0.07)
Basophils Absolute: 0.1 10*3/uL (ref 0.0–0.1)
Basophils Relative: 1 %
Eosinophils Absolute: 0.2 10*3/uL (ref 0.0–0.5)
Eosinophils Relative: 2 %
HCT: 50.2 % (ref 39.0–52.0)
Hemoglobin: 16.4 g/dL (ref 13.0–17.0)
Immature Granulocytes: 1 %
Lymphocytes Relative: 30 %
Lymphs Abs: 2.9 10*3/uL (ref 0.7–4.0)
MCH: 28.5 pg (ref 26.0–34.0)
MCHC: 32.7 g/dL (ref 30.0–36.0)
MCV: 87.2 fL (ref 80.0–100.0)
Monocytes Absolute: 0.9 10*3/uL (ref 0.1–1.0)
Monocytes Relative: 10 %
Neutro Abs: 5.7 10*3/uL (ref 1.7–7.7)
Neutrophils Relative %: 56 %
Platelets: 205 10*3/uL (ref 150–400)
RBC: 5.76 MIL/uL (ref 4.22–5.81)
RDW: 16.2 % — ABNORMAL HIGH (ref 11.5–15.5)
WBC: 9.8 10*3/uL (ref 4.0–10.5)
nRBC: 0 % (ref 0.0–0.2)

## 2021-06-25 NOTE — Patient Instructions (Signed)
Therapeutic Phlebotomy Therapeutic phlebotomy is the planned removal of blood from a person's body for the purpose of treating a medical condition. The procedure is similar to donating blood. Usually, about a pint (470 mL, or 0.47 L) of blood is removed.The average adult has 9-12 pints (4.3-5.7 L) of blood in the body. Therapeutic phlebotomy may be used to treat the following medical conditions: Hemochromatosis. This is a condition in which the blood contains too much iron. Polycythemia vera. This is a condition in which the blood contains too many red blood cells. Porphyria cutanea tarda. This is a disease in which an important part of hemoglobin is not made properly. It results in the buildup of abnormal amounts of porphyrins in the body. Sickle cell disease. This is a condition in which the red blood cells form an abnormal crescent shape rather than a round shape. Tell a health care provider about: Any allergies you have. All medicines you are taking, including vitamins, herbs, eye drops, creams, and over-the-counter medicines. Any problems you or family members have had with anesthetic medicines. Any blood disorders you have. Any surgeries you have had. Any medical conditions you have. Whether you are pregnant or may be pregnant. What are the risks? Generally, this is a safe procedure. However, problems may occur, including: Nausea or light-headedness. Low blood pressure (hypotension). Soreness, bleeding, swelling, or bruising at the needle insertion site. Infection. What happens before the procedure? Follow instructions from your health care provider about eating or drinking restrictions. Ask your health care provider about: Changing or stopping your regular medicines. This is especially important if you are taking diabetes medicines or blood thinners (anticoagulants). Taking medicines such as aspirin and ibuprofen. These medicines can thin your blood. Do not take these medicines unless  your health care provider tells you to take them. Taking over-the-counter medicines, vitamins, herbs, and supplements. Wear clothing with sleeves that can be raised above the elbow. Plan to have someone take you home from the hospital or clinic. You may have a blood sample taken. Your blood pressure, pulse rate, and breathing rate will be measured. What happens during the procedure?  To lower your risk of infection: Your health care team will wash or sanitize their hands. Your skin will be cleaned with an antiseptic. You may be given a medicine to numb the area (local anesthetic). A tourniquet will be placed on your arm. A needle will be inserted into one of your veins. Tubing and a collection bag will be attached to that needle. Blood will flow through the needle and tubing into the collection bag. The collection bag will be placed lower than your arm to allow gravity to help the flow of blood into the bag. You may be asked to open and close your hand slowly and continually during the entire collection. After the specified amount of blood has been removed from your body, the collection bag and tubing will be clamped. The needle will be removed from your vein. Pressure will be held on the site of the needle insertion to stop the bleeding. A bandage (dressing) will be placed over the needle insertion site. The procedure may vary among health care providers and hospitals. What happens after the procedure? Your blood pressure, pulse rate, and breathing rate will be measured after the procedure. You will be encouraged to drink fluids. Your recovery will be assessed and monitored. You can return to your normal activities as told by your health care provider. Summary Therapeutic phlebotomy is the planned removal of   blood from a person's body for the purpose of treating a medical condition. Therapeutic phlebotomy may be used to treat hemochromatosis, polycythemia vera, porphyria cutanea tarda,  or sickle cell disease. In the procedure, a needle is inserted and about a pint (470 mL, or 0.47 L) of blood is removed. The average adult has 9-12 pints (4.3-5.7 L) of blood in the body. This is generally a safe procedure, but it can sometimes cause problems such as nausea, light-headedness, or low blood pressure (hypotension). This information is not intended to replace advice given to you by your health care provider. Make sure you discuss any questions you have with your healthcare provider. Document Revised: 11/26/2017 Document Reviewed: 11/26/2017 Elsevier Patient Education  2022 [employer]  

## 2021-06-25 NOTE — Progress Notes (Signed)
Phlebotomy procedure started at 14:52 with 16G in right AC and ended at 14:57.  522 grams obtained. Pt was provided water.  Pt remained 15 minute post observation. VSS. Tolerated procedure well. Pt ambulatory to lobby.

## 2021-06-26 ENCOUNTER — Encounter: Payer: Self-pay | Admitting: Hematology and Oncology

## 2021-06-26 ENCOUNTER — Other Ambulatory Visit: Payer: Self-pay | Admitting: Hematology and Oncology

## 2021-06-26 NOTE — Progress Notes (Signed)
[place] OFFICE PROGRESS NOTE  Chris Baton, MD  ASSESSMENT & PLAN:  Polycythemia The patient has not quit smoking yet but he is motivated. 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 I recommend 1 unit of phlebotomy every 4 months to keep hemoglobin less than 15 g and HCT < 48 He will continue aspirin therapy to reduce risk of blood clots.  Tobacco abuse We discussed various strategies to help him quit smoking I also encourage lifestyle modification, dietary change and weight loss as tolerated  No orders of the defined types were placed in this encounter.   The total time spent in the appointment was 20 minutes encounter with patients including review of chart and various tests results, discussions about plan of care and coordination of care plan   All questions were answered. The patient knows to call the clinic with any problems, questions or concerns. No barriers to learning was detected.    Chris Lark, MD 8/3/20228:30 AM  INTERVAL HISTORY: Chris Long 60 y.o. male returns for follow-up on chronic polycythemia likely secondary to smoking and obstructive sleep apnea Unfortunately, he continues to smoke somewhere around 10 to 12 cigarettes/day He has no signs or symptoms of polycythemia such as headache or cramps He tolerated phlebotomy well  SUMMARY OF HEMATOLOGIC HISTORY:  This is a patient seen by another hematologist in December 2013 for severe erythrocytosis with associated leukocytosis. Peripheral blood for JAK 2 mutation was negative. The patient was advised to stop smoking. He also underwent sleep study and was told he had mild obstructive sleep apnea but declined treatment for now. Since 2015 he had multiple phlebotomy sessions. CT lung screening in April 2017 was negative He has 1 unit of blood removed every time hemoglobin is greater  than 15  I have reviewed the past medical history, past surgical history, social history and family history with the patient and they are unchanged from previous note.  ALLERGIES:  has No Known Allergies.  MEDICATIONS:  Current Outpatient Medications  Medication Sig Dispense Refill   aspirin 325 MG tablet Take 325 mg by mouth daily.     levothyroxine (SYNTHROID, LEVOTHROID) 88 MCG tablet Take 88 mcg by mouth daily.     lisinopril (PRINIVIL,ZESTRIL) 10 MG tablet Take 5 mg by mouth daily.  3   metFORMIN (GLUCOPHAGE-XR) 500 MG 24 hr tablet Take 500 mg by mouth daily.  6   metoprolol (LOPRESSOR) 50 MG tablet Take 0.5 tablets (25 mg total) by mouth 2 (two) times daily. 90 tablet 3   rosuvastatin (CRESTOR) 10 MG tablet Take 20 mg by mouth daily.      No current facility-administered medications for this visit.     REVIEW OF SYSTEMS:   Constitutional: Denies fevers, chills or night sweats Eyes: Denies blurriness of vision Ears, nose, mouth, throat, and face: Denies mucositis or sore throat Respiratory: Denies cough, dyspnea or wheezes Cardiovascular: Denies palpitation, chest discomfort or lower extremity swelling Gastrointestinal:  Denies nausea, heartburn or change in bowel habits Skin: Denies abnormal skin rashes Lymphatics: Denies new lymphadenopathy or easy bruising Neurological:Denies numbness, tingling or new weaknesses Behavioral/Psych: Mood is stable, no new changes  All other systems were reviewed with the patient and are negative.  PHYSICAL EXAMINATION: ECOG PERFORMANCE STATUS: 0 - Asymptomatic  Vitals:   06/25/21 1434  BP: 127/80  Pulse: 83  Resp: 18  Temp: 98.5 F (36.9  C)  SpO2: 98%   Filed Weights   06/25/21 1434  Weight: 218 lb 12.8 oz (99.2 kg)    GENERAL:alert, no distress and comfortable SKIN: skin color, texture, turgor are normal, no rashes or significant lesions EYES: normal, Conjunctiva are pink and non-injected, sclera clear OROPHARYNX:no exudate,  no erythema and lips, buccal mucosa, and tongue normal  NECK: supple, thyroid normal size, non-tender, without nodularity LYMPH:  no palpable lymphadenopathy in the cervical, axillary or inguinal LUNGS: clear to auscultation and percussion with normal breathing effort HEART: regular rate & rhythm and no murmurs and no lower extremity edema ABDOMEN:abdomen soft, non-tender and normal bowel sounds Musculoskeletal:no cyanosis of digits and no clubbing  NEURO: alert & oriented x 3 with fluent speech, no focal motor/sensory deficits  LABORATORY DATA:  I have reviewed the data as listed     Component Value Date/Time   NA 139 09/13/2014 1315   K 4.7 09/13/2014 1315   CL 102 09/13/2014 1315   CO2 23 09/13/2014 1315   GLUCOSE 114 (H) 09/13/2014 1315   BUN 15 09/13/2014 1315   CREATININE 0.90 09/13/2014 1315   CALCIUM 9.7 09/13/2014 1315   PROT 7.2 09/13/2014 1315   ALBUMIN 3.9 09/13/2014 1315   AST 21 09/13/2014 1315   ALT 32 09/13/2014 1315   ALKPHOS 60 09/13/2014 1315   BILITOT 0.3 09/13/2014 1315   GFRNONAA >90 09/13/2014 1315   GFRAA >90 09/13/2014 1315    No results found for: SPEP, UPEP  Lab Results  Component Value Date   WBC 9.8 06/25/2021   NEUTROABS 5.7 06/25/2021   HGB 16.4 06/25/2021   HCT 50.2 06/25/2021   MCV 87.2 06/25/2021   PLT 205 06/25/2021      Chemistry      Component Value Date/Time   NA 139 09/13/2014 1315   K 4.7 09/13/2014 1315   CL 102 09/13/2014 1315   CO2 23 09/13/2014 1315   BUN 15 09/13/2014 1315   CREATININE 0.90 09/13/2014 1315      Component Value Date/Time   CALCIUM 9.7 09/13/2014 1315   ALKPHOS 60 09/13/2014 1315   AST 21 09/13/2014 1315   ALT 32 09/13/2014 1315   BILITOT 0.3 09/13/2014 1315

## 2021-06-26 NOTE — Assessment & Plan Note (Signed)
The patient has not quit smoking yet but he is motivated. 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 I recommend 1 unit of phlebotomy every 4 months to keep hemoglobin less than 15 g and HCT < 48 He will continue aspirin therapy to reduce risk of blood clots.

## 2021-06-26 NOTE — Assessment & Plan Note (Signed)
We discussed various strategies to help him quit smoking I also encourage lifestyle modification, dietary change and weight loss as tolerated 

## 2021-07-05 DIAGNOSIS — H5213 Myopia, bilateral: Secondary | ICD-10-CM | POA: Diagnosis not present

## 2021-07-05 DIAGNOSIS — H2513 Age-related nuclear cataract, bilateral: Secondary | ICD-10-CM | POA: Diagnosis not present

## 2021-07-05 DIAGNOSIS — H25042 Posterior subcapsular polar age-related cataract, left eye: Secondary | ICD-10-CM | POA: Diagnosis not present

## 2021-09-17 DIAGNOSIS — E1122 Type 2 diabetes mellitus with diabetic chronic kidney disease: Secondary | ICD-10-CM | POA: Diagnosis not present

## 2021-09-17 DIAGNOSIS — E039 Hypothyroidism, unspecified: Secondary | ICD-10-CM | POA: Diagnosis not present

## 2021-09-17 DIAGNOSIS — E785 Hyperlipidemia, unspecified: Secondary | ICD-10-CM | POA: Diagnosis not present

## 2021-09-17 DIAGNOSIS — Z125 Encounter for screening for malignant neoplasm of prostate: Secondary | ICD-10-CM | POA: Diagnosis not present

## 2021-09-24 DIAGNOSIS — E1122 Type 2 diabetes mellitus with diabetic chronic kidney disease: Secondary | ICD-10-CM | POA: Diagnosis not present

## 2021-09-24 DIAGNOSIS — Z0000 Encounter for general adult medical examination without abnormal findings: Secondary | ICD-10-CM | POA: Diagnosis not present

## 2021-09-24 DIAGNOSIS — R82998 Other abnormal findings in urine: Secondary | ICD-10-CM | POA: Diagnosis not present

## 2021-09-24 DIAGNOSIS — Z1212 Encounter for screening for malignant neoplasm of rectum: Secondary | ICD-10-CM | POA: Diagnosis not present

## 2021-09-26 DIAGNOSIS — H25042 Posterior subcapsular polar age-related cataract, left eye: Secondary | ICD-10-CM | POA: Diagnosis not present

## 2021-10-30 ENCOUNTER — Other Ambulatory Visit: Payer: BC Managed Care – PPO

## 2021-10-30 ENCOUNTER — Ambulatory Visit: Payer: BC Managed Care – PPO

## 2021-11-06 ENCOUNTER — Other Ambulatory Visit: Payer: Self-pay

## 2021-11-06 ENCOUNTER — Inpatient Hospital Stay: Payer: BC Managed Care – PPO

## 2021-11-06 ENCOUNTER — Inpatient Hospital Stay: Payer: BC Managed Care – PPO | Attending: Hematology and Oncology

## 2021-11-06 VITALS — BP 115/73 | HR 87 | Temp 98.0°F | Resp 16

## 2021-11-06 DIAGNOSIS — F17210 Nicotine dependence, cigarettes, uncomplicated: Secondary | ICD-10-CM | POA: Diagnosis not present

## 2021-11-06 DIAGNOSIS — D751 Secondary polycythemia: Secondary | ICD-10-CM

## 2021-11-06 LAB — CBC WITH DIFFERENTIAL/PLATELET
Abs Immature Granulocytes: 0.05 10*3/uL (ref 0.00–0.07)
Basophils Absolute: 0.1 10*3/uL (ref 0.0–0.1)
Basophils Relative: 1 %
Eosinophils Absolute: 0.2 10*3/uL (ref 0.0–0.5)
Eosinophils Relative: 2 %
HCT: 51.6 % (ref 39.0–52.0)
Hemoglobin: 17.1 g/dL — ABNORMAL HIGH (ref 13.0–17.0)
Immature Granulocytes: 1 %
Lymphocytes Relative: 33 %
Lymphs Abs: 3.3 10*3/uL (ref 0.7–4.0)
MCH: 29.1 pg (ref 26.0–34.0)
MCHC: 33.1 g/dL (ref 30.0–36.0)
MCV: 87.8 fL (ref 80.0–100.0)
Monocytes Absolute: 1.2 10*3/uL — ABNORMAL HIGH (ref 0.1–1.0)
Monocytes Relative: 12 %
Neutro Abs: 5.3 10*3/uL (ref 1.7–7.7)
Neutrophils Relative %: 51 %
Platelets: 231 10*3/uL (ref 150–400)
RBC: 5.88 MIL/uL — ABNORMAL HIGH (ref 4.22–5.81)
RDW: 15.4 % (ref 11.5–15.5)
WBC: 10.2 10*3/uL (ref 4.0–10.5)
nRBC: 0 % (ref 0.0–0.2)

## 2021-11-06 NOTE — Patient Instructions (Signed)

## 2021-11-06 NOTE — Progress Notes (Signed)
Chris Long presents today for phlebotomy per MD orders. Phlebotomy procedure started at 1443 and ended at 1649 using 16g kit to [facility] . 548 grams removed. Patient observed for 15 minutes after procedure without any incident. Patient declined to stay entire 30 minute post observation period. Patient offered food and drink. IV needle intact upon removal. Patient tolerated procedure with no adverse affects, vitals stable and patient in no distress upon leaving infusion clinic.

## 2021-11-20 ENCOUNTER — Other Ambulatory Visit: Payer: Self-pay

## 2021-11-20 ENCOUNTER — Encounter: Payer: Self-pay | Admitting: Dermatology

## 2021-11-20 ENCOUNTER — Ambulatory Visit (INDEPENDENT_AMBULATORY_CARE_PROVIDER_SITE_OTHER): Payer: BC Managed Care – PPO | Admitting: Dermatology

## 2021-11-20 DIAGNOSIS — L918 Other hypertrophic disorders of the skin: Secondary | ICD-10-CM | POA: Diagnosis not present

## 2021-11-20 DIAGNOSIS — L821 Other seborrheic keratosis: Secondary | ICD-10-CM

## 2021-11-20 DIAGNOSIS — D485 Neoplasm of uncertain behavior of skin: Secondary | ICD-10-CM | POA: Diagnosis not present

## 2021-11-20 DIAGNOSIS — D2371 Other benign neoplasm of skin of right lower limb, including hip: Secondary | ICD-10-CM

## 2021-11-20 DIAGNOSIS — Z1283 Encounter for screening for malignant neoplasm of skin: Secondary | ICD-10-CM

## 2021-11-20 DIAGNOSIS — D239 Other benign neoplasm of skin, unspecified: Secondary | ICD-10-CM | POA: Diagnosis not present

## 2021-11-20 NOTE — Patient Instructions (Signed)

## 2021-11-28 DIAGNOSIS — M67911 Unspecified disorder of synovium and tendon, right shoulder: Secondary | ICD-10-CM | POA: Diagnosis not present

## 2021-12-04 DIAGNOSIS — H2512 Age-related nuclear cataract, left eye: Secondary | ICD-10-CM | POA: Diagnosis not present

## 2021-12-04 DIAGNOSIS — H25812 Combined forms of age-related cataract, left eye: Secondary | ICD-10-CM | POA: Diagnosis not present

## 2021-12-22 ENCOUNTER — Encounter: Payer: Self-pay | Admitting: Dermatology

## 2021-12-22 NOTE — Progress Notes (Signed)
° °  New Patient   Subjective  Chris Long is a 61 y.o. male who presents for the following: Annual Exam (Spot ? Tag on left arm & right lower leg- itches).  General skin examination, several spots of concern Location:  Duration:  Quality:  Associated Signs/Symptoms: Modifying Factors:  Severity:  Timing: Context:    The following portions of the chart were reviewed this encounter and updated as appropriate:  Tobacco   Allergies   Meds   Problems   Med Hx   Surg Hx   Fam Hx       Objective  Well appearing patient in no apparent distress; mood and affect are within normal limits. Mid Back Full body skin exam.  No atypical pigmented lesions.  1 possible nonmelanoma skin cancers arm and leg will be biopsied.  Right Upper Arm - Posterior Pedunculated 3 mm flesh-colored papule  Left Breast, Right Upper Back Flattopped textured brown 5 mm papules  Right Upper Arm - Anterior Pink-tan 6 mm crust     Right Lower Leg - Anterior Pearly 5 mm papule with focal erosion       A full examination was performed including scalp, head, eyes, ears, nose, lips, neck, chest, axillae, abdomen, back, buttocks, bilateral upper extremities, bilateral lower extremities, hands, feet, fingers, toes, fingernails, and toenails. All findings within normal limits unless otherwise noted below.   Assessment & Plan  Encounter for screening for malignant neoplasm of skin Mid Back  Yearly skin exams.  Encouraged to self examine with partner twice annually.  Continue ultraviolet protection.  Skin tag Right Upper Arm - Posterior  Benign no treatment unless patient wants it done.   Seborrheic keratosis (2) Left Breast; Right Upper Back  Leave if stable  Neoplasm of uncertain behavior of skin (2) Right Upper Arm - Anterior  Skin / nail biopsy Type of biopsy: tangential   Informed consent: discussed and consent obtained   Timeout: patient name, date of birth, surgical site, and procedure  verified   Anesthesia: the lesion was anesthetized in a standard fashion   Anesthetic:  1% lidocaine w/ epinephrine 1-100,000 local infiltration Instrument used: flexible razor blade   Hemostasis achieved with: aluminum chloride and electrodesiccation   Outcome: patient tolerated procedure well   Post-procedure details: wound care instructions given    Specimen 1 - Surgical pathology Differential Diagnosis: tag mole  Check Margins: No  Right Lower Leg - Anterior  Skin / nail biopsy Type of biopsy: tangential   Informed consent: discussed and consent obtained   Timeout: patient name, date of birth, surgical site, and procedure verified   Anesthesia: the lesion was anesthetized in a standard fashion   Anesthetic:  1% lidocaine w/ epinephrine 1-100,000 local infiltration Instrument used: flexible razor blade   Hemostasis achieved with: aluminum chloride and electrodesiccation   Outcome: patient tolerated procedure well   Post-procedure details: wound care instructions given    Specimen 2 - Surgical pathology Differential Diagnosis: scc vs bcc  Check Margins: No

## 2022-01-29 DIAGNOSIS — H2511 Age-related nuclear cataract, right eye: Secondary | ICD-10-CM | POA: Diagnosis not present

## 2022-01-29 DIAGNOSIS — H25811 Combined forms of age-related cataract, right eye: Secondary | ICD-10-CM | POA: Diagnosis not present

## 2022-02-03 DIAGNOSIS — E1122 Type 2 diabetes mellitus with diabetic chronic kidney disease: Secondary | ICD-10-CM | POA: Diagnosis not present

## 2022-02-25 ENCOUNTER — Inpatient Hospital Stay: Payer: BC Managed Care – PPO | Attending: Hematology and Oncology

## 2022-02-25 ENCOUNTER — Inpatient Hospital Stay: Payer: BC Managed Care – PPO

## 2022-02-25 ENCOUNTER — Other Ambulatory Visit: Payer: Self-pay

## 2022-02-25 VITALS — BP 116/89 | HR 93 | Temp 98.5°F | Resp 16

## 2022-02-25 DIAGNOSIS — D751 Secondary polycythemia: Secondary | ICD-10-CM | POA: Insufficient documentation

## 2022-02-25 LAB — CBC WITH DIFFERENTIAL/PLATELET
Abs Immature Granulocytes: 0.03 10*3/uL (ref 0.00–0.07)
Basophils Absolute: 0.1 10*3/uL (ref 0.0–0.1)
Basophils Relative: 1 %
Eosinophils Absolute: 0.2 10*3/uL (ref 0.0–0.5)
Eosinophils Relative: 2 %
HCT: 51.4 % (ref 39.0–52.0)
Hemoglobin: 16.9 g/dL (ref 13.0–17.0)
Immature Granulocytes: 0 %
Lymphocytes Relative: 28 %
Lymphs Abs: 2.6 10*3/uL (ref 0.7–4.0)
MCH: 29.5 pg (ref 26.0–34.0)
MCHC: 32.9 g/dL (ref 30.0–36.0)
MCV: 89.9 fL (ref 80.0–100.0)
Monocytes Absolute: 0.9 10*3/uL (ref 0.1–1.0)
Monocytes Relative: 10 %
Neutro Abs: 5.5 10*3/uL (ref 1.7–7.7)
Neutrophils Relative %: 59 %
Platelets: 158 10*3/uL (ref 150–400)
RBC: 5.72 MIL/uL (ref 4.22–5.81)
RDW: 14.7 % (ref 11.5–15.5)
WBC: 9.2 10*3/uL (ref 4.0–10.5)
nRBC: 0 % (ref 0.0–0.2)

## 2022-02-25 NOTE — Progress Notes (Signed)
BP 116/89 (BP Location: Left Arm, Patient Position: Sitting)   Pulse 93   Temp 98.5 ?F (36.9 ?C) (Oral)   Resp 16   SpO2 97%  ?Patient came today for a phlebotomy per orders from Dr. Alvy Bimler. Per the order his hemoglobin was >15 (16.9). 16g needle was used- started at 1530 and completed at 1535. 518g of blood removed. Patient tolerated the procedure well with no distress. Declined to stay for his 30 minute observation- patient has had phlebotomy before with no issues and feels fine. VSS and needle removed intact. Food and fluids offered- drank water. ?

## 2022-02-25 NOTE — Patient Instructions (Signed)

## 2022-03-11 DIAGNOSIS — D123 Benign neoplasm of transverse colon: Secondary | ICD-10-CM | POA: Diagnosis not present

## 2022-03-11 DIAGNOSIS — K649 Unspecified hemorrhoids: Secondary | ICD-10-CM | POA: Diagnosis not present

## 2022-03-11 DIAGNOSIS — K5730 Diverticulosis of large intestine without perforation or abscess without bleeding: Secondary | ICD-10-CM | POA: Diagnosis not present

## 2022-03-11 DIAGNOSIS — Z1211 Encounter for screening for malignant neoplasm of colon: Secondary | ICD-10-CM | POA: Diagnosis not present

## 2022-03-11 DIAGNOSIS — Z86010 Personal history of colonic polyps: Secondary | ICD-10-CM | POA: Diagnosis not present

## 2022-06-24 DIAGNOSIS — E1122 Type 2 diabetes mellitus with diabetic chronic kidney disease: Secondary | ICD-10-CM | POA: Diagnosis not present

## 2022-06-27 ENCOUNTER — Ambulatory Visit: Payer: BC Managed Care – PPO | Admitting: Hematology and Oncology

## 2022-06-27 ENCOUNTER — Ambulatory Visit: Payer: BC Managed Care – PPO

## 2022-06-27 ENCOUNTER — Other Ambulatory Visit: Payer: BC Managed Care – PPO

## 2022-07-04 ENCOUNTER — Encounter: Payer: Self-pay | Admitting: Cardiology

## 2022-07-04 ENCOUNTER — Ambulatory Visit: Payer: BC Managed Care – PPO | Admitting: Cardiology

## 2022-07-04 ENCOUNTER — Inpatient Hospital Stay: Payer: BC Managed Care – PPO | Admitting: Hematology and Oncology

## 2022-07-04 ENCOUNTER — Encounter: Payer: Self-pay | Admitting: Hematology and Oncology

## 2022-07-04 ENCOUNTER — Inpatient Hospital Stay: Payer: BC Managed Care – PPO | Attending: Hematology and Oncology

## 2022-07-04 ENCOUNTER — Inpatient Hospital Stay: Payer: BC Managed Care – PPO

## 2022-07-04 ENCOUNTER — Other Ambulatory Visit: Payer: Self-pay

## 2022-07-04 VITALS — BP 106/86 | HR 85 | Temp 97.8°F | Resp 16 | Ht 73.0 in | Wt 223.0 lb

## 2022-07-04 DIAGNOSIS — I2510 Atherosclerotic heart disease of native coronary artery without angina pectoris: Secondary | ICD-10-CM

## 2022-07-04 DIAGNOSIS — Z8774 Personal history of (corrected) congenital malformations of heart and circulatory system: Secondary | ICD-10-CM | POA: Diagnosis not present

## 2022-07-04 DIAGNOSIS — E782 Mixed hyperlipidemia: Secondary | ICD-10-CM | POA: Diagnosis not present

## 2022-07-04 DIAGNOSIS — F17200 Nicotine dependence, unspecified, uncomplicated: Secondary | ICD-10-CM | POA: Diagnosis not present

## 2022-07-04 DIAGNOSIS — Z720 Tobacco use: Secondary | ICD-10-CM

## 2022-07-04 DIAGNOSIS — Z79899 Other long term (current) drug therapy: Secondary | ICD-10-CM | POA: Insufficient documentation

## 2022-07-04 DIAGNOSIS — D751 Secondary polycythemia: Secondary | ICD-10-CM

## 2022-07-04 DIAGNOSIS — E119 Type 2 diabetes mellitus without complications: Secondary | ICD-10-CM

## 2022-07-04 DIAGNOSIS — F17210 Nicotine dependence, cigarettes, uncomplicated: Secondary | ICD-10-CM | POA: Diagnosis not present

## 2022-07-04 LAB — CBC WITH DIFFERENTIAL/PLATELET
Abs Immature Granulocytes: 0.02 10*3/uL (ref 0.00–0.07)
Basophils Absolute: 0.1 10*3/uL (ref 0.0–0.1)
Basophils Relative: 1 %
Eosinophils Absolute: 0.1 10*3/uL (ref 0.0–0.5)
Eosinophils Relative: 2 %
HCT: 52.9 % — ABNORMAL HIGH (ref 39.0–52.0)
Hemoglobin: 18.1 g/dL — ABNORMAL HIGH (ref 13.0–17.0)
Immature Granulocytes: 0 %
Lymphocytes Relative: 28 %
Lymphs Abs: 2.4 10*3/uL (ref 0.7–4.0)
MCH: 30.5 pg (ref 26.0–34.0)
MCHC: 34.2 g/dL (ref 30.0–36.0)
MCV: 89.1 fL (ref 80.0–100.0)
Monocytes Absolute: 1 10*3/uL (ref 0.1–1.0)
Monocytes Relative: 12 %
Neutro Abs: 4.9 10*3/uL (ref 1.7–7.7)
Neutrophils Relative %: 57 %
Platelets: 191 10*3/uL (ref 150–400)
RBC: 5.94 MIL/uL — ABNORMAL HIGH (ref 4.22–5.81)
RDW: 14.8 % (ref 11.5–15.5)
WBC: 8.4 10*3/uL (ref 4.0–10.5)
nRBC: 0 % (ref 0.0–0.2)

## 2022-07-04 NOTE — Progress Notes (Signed)
[facility] OFFICE PROGRESS NOTE  Chris Baton, MD  ASSESSMENT & PLAN:  Polycythemia The patient has not quit smoking yet but he is motivated. 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 I recommend 1 unit of phlebotomy every 4 months to keep hemoglobin less than 16 g He will continue aspirin therapy to reduce risk of blood clots He inform me of recent changes at the blood bank guidelines for blood donors; he will make some inquiries but if he can donate blood at the local blood bank, he does not need to return here long-term  Tobacco abuse We discussed various strategies to help him quit smoking I also encourage lifestyle modification, dietary change and weight loss as tolerated  No orders of the defined types were placed in this encounter.   The total time spent in the appointment was 20 minutes encounter with patients including review of chart and various tests results, discussions about plan of care and coordination of care plan   All questions were answered. The patient knows to call the clinic with any problems, questions or concerns. No barriers to learning was detected.    Heath Lark, MD 8/11/202312:26 PM  INTERVAL HISTORY: Chris Long 61 y.o. 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 Peripheral blood for JAK 2 mutation was negative. The patient was advised to stop smoking. He also underwent sleep study and was told he had mild obstructive sleep apnea but declined treatment for now. Since 2015 he  had multiple phlebotomy sessions. CT lung screening in April 2017 was negative He has 1 unit of blood removed every time hemoglobin is greater than 15  I have reviewed the past medical history, past surgical history, social history and family history with the patient and they are unchanged from previous note.  ALLERGIES:  has No Known Allergies.  MEDICATIONS:  Current Outpatient Medications  Medication Sig Dispense Refill   levothyroxine (SYNTHROID, LEVOTHROID) 88 MCG tablet Take 112 mcg by mouth daily.     Apple Cider Vinegar 188 MG CAPS Take 2 capsules by mouth daily at 6 (six) AM.     aspirin 325 MG tablet Take 325 mg by mouth daily.     Black Pepper-Turmeric (TURMERIC CURCUMIN) 03-999 MG CAPS Take 2 Capfuls by mouth daily at 6 (six) AM.     Calcium Polycarbophil (FIBER-CAPS PO) Take by mouth.     CINNAMON PO Take 2 capsules by mouth daily.     co-enzyme Q-10 30 MG capsule Take 1 capsule by mouth daily.     dapagliflozin propanediol (FARXIGA) 10 MG TABS tablet      ezetimibe (ZETIA) 10 MG tablet Take by mouth.     losartan (COZAAR) 25 MG tablet Take 12.5 mg by mouth daily.     metFORMIN (GLUCOPHAGE-XR) 500 MG 24 hr tablet Take 500 mg by mouth daily.  6   metoprolol (LOPRESSOR) 50 MG tablet Take 0.5 tablets (25 mg total) by mouth 2 (two) times daily. 90 tablet 3   OVER THE COUNTER MEDICATION Take 1 capsule by mouth daily. [place] Tail Mushrooms  OZEMPIC, 0.25 OR 0.5 MG/DOSE, 2 MG/3ML SOPN Inject into the skin.     Rosuvastatin Calcium 40 MG CPSP Take 40 mg by mouth daily.     No current facility-administered medications for this visit.     REVIEW OF SYSTEMS:   Constitutional: Denies fevers, chills or night sweats Eyes: Denies blurriness of vision Ears, nose, mouth, throat, and face: Denies mucositis or sore throat Respiratory: Denies cough, dyspnea or wheezes Cardiovascular: Denies palpitation, chest discomfort or lower extremity swelling Gastrointestinal:  Denies nausea,  heartburn or change in bowel habits Skin: Denies abnormal skin rashes Lymphatics: Denies new lymphadenopathy or easy bruising Neurological:Denies numbness, tingling or new weaknesses Behavioral/Psych: Mood is stable, no new changes  All other systems were reviewed with the patient and are negative.  PHYSICAL EXAMINATION: ECOG PERFORMANCE STATUS: 0 - Asymptomatic  Vitals:   07/04/22 1203  BP: 118/82  Pulse: 80  Resp: 18  Temp: (!) 97.5 F (36.4 C)  SpO2: 95%   Filed Weights   07/04/22 1203  Weight: 223 lb 12.8 oz (101.5 kg)    GENERAL:alert, no distress and comfortable NEURO: alert & oriented x 3 with fluent speech, no focal motor/sensory deficits  LABORATORY DATA:  I have reviewed the data as listed     Component Value Date/Time   NA 139 09/13/2014 1315   K 4.7 09/13/2014 1315   CL 102 09/13/2014 1315   CO2 23 09/13/2014 1315   GLUCOSE 114 (H) 09/13/2014 1315   BUN 15 09/13/2014 1315   CREATININE 0.90 09/13/2014 1315   CALCIUM 9.7 09/13/2014 1315   PROT 7.2 09/13/2014 1315   ALBUMIN 3.9 09/13/2014 1315   AST 21 09/13/2014 1315   ALT 32 09/13/2014 1315   ALKPHOS 60 09/13/2014 1315   BILITOT 0.3 09/13/2014 1315   GFRNONAA >90 09/13/2014 1315   GFRAA >90 09/13/2014 1315    No results found for: "SPEP", "UPEP"  Lab Results  Component Value Date   WBC 8.4 07/04/2022   NEUTROABS 4.9 07/04/2022   HGB 18.1 (H) 07/04/2022   HCT 52.9 (H) 07/04/2022   MCV 89.1 07/04/2022   PLT 191 07/04/2022      Chemistry      Component Value Date/Time   NA 139 09/13/2014 1315   K 4.7 09/13/2014 1315   CL 102 09/13/2014 1315   CO2 23 09/13/2014 1315   BUN 15 09/13/2014 1315   CREATININE 0.90 09/13/2014 1315      Component Value Date/Time   CALCIUM 9.7 09/13/2014 1315   ALKPHOS 60 09/13/2014 1315   AST 21 09/13/2014 1315   ALT 32 09/13/2014 1315   BILITOT 0.3 09/13/2014 1315

## 2022-07-04 NOTE — Progress Notes (Signed)
Chris Long presents today for phlebotomy per MD orders. Phlebotomy procedure started at 1121 and ended at 1131. 520 cc removed. Patient tolerated procedure well. IV needle removed intact. Beverage provided, VSS and pt discharged in stable condition.

## 2022-07-04 NOTE — Patient Instructions (Signed)

## 2022-07-04 NOTE — Progress Notes (Signed)
ID:  Chris Long, DOB 1961-03-16, MRN [id number]  PCP:  [name], MD  Cardiologist:  Rex Kras, DO, [facility] (established care 07/04/2022) Former Cardiology Providers: Dr. Martinique  REASON FOR CONSULT: Evaluate for CAD  REQUESTING PHYSICIAN:  [name], MD [address] [place],  [place] [address]  Chief Complaint  Patient presents with   Coronary Artery Disease   New Patient (Initial Visit)    HPI  Chris Long is a 61 y.o.  male who presents to the clinic to reestablish care given his history of ASD repair and mild CAD at the request of [name], MD. His past medical history and cardiovascular risk factors include: Non-insulin-dependent diabetes mellitus type 2, hypertension, hyperlipidemia, mild CAD per cath 2015, history of ASD repair 1972, cigarette smoking.   This patient is accompanied in the office by his  Chris Long, life partner . EMMERSON TADDEI provides verbal consent with regards to having them present during today's encounter.  Patient is here to reestablish care with cardiology given his history of ASD repair in 1972 and mild CAD noted on angiography back in 2015.  He used to follow-up with Dr. Martinique and is now transferring his care to [facility] cardiovascular.  Clinically he denies angina pectoris or heart failure symptoms.  Overall good functional capacity for age.  Mother had a myocardial infarction at the age of 58 and also had ASD.  FUNCTIONAL STATUS: Walks 2 miles at least 4-5 days a week.  ALLERGIES: No Known Allergies  MEDICATION LIST PRIOR TO VISIT: Current Meds  Medication Sig   Apple Cider Vinegar 188 MG CAPS Take 2 capsules by mouth daily at 6 (six) AM.   aspirin 325 MG tablet Take 325 mg by mouth daily.   Black Pepper-Turmeric (TURMERIC CURCUMIN) 03-999 MG CAPS Take 2 Capfuls by mouth daily at 6 (six) AM.   Calcium Polycarbophil (FIBER-CAPS PO) Take by mouth.   CINNAMON PO Take 2 capsules by mouth daily.   co-enzyme Q-10 30 MG  capsule Take 1 capsule by mouth daily.   dapagliflozin propanediol (FARXIGA) 10 MG TABS tablet    ezetimibe (ZETIA) 10 MG tablet Take by mouth.   levothyroxine (SYNTHROID, LEVOTHROID) 88 MCG tablet Take 112 mcg by mouth daily.   losartan (COZAAR) 25 MG tablet Take 12.5 mg by mouth daily.   metFORMIN (GLUCOPHAGE-XR) 500 MG 24 hr tablet Take 500 mg by mouth daily.   metoprolol (LOPRESSOR) 50 MG tablet Take 0.5 tablets (25 mg total) by mouth 2 (two) times daily.   OVER THE COUNTER MEDICATION Take 1 capsule by mouth daily. [place] Tail Mushrooms   OZEMPIC, 0.25 OR 0.5 MG/DOSE, 2 MG/3ML SOPN Inject into the skin.   Rosuvastatin Calcium 40 MG CPSP Take 40 mg by mouth daily.     PAST MEDICAL HISTORY: Past Medical History:  Diagnosis Date   ASD (atrial septal defect)    with repair in 1972   Coronary artery disease    Mild CAD per cath in 2010   Cough, persistent 02/22/2016   Family history of Lynch syndrome    maternal relatives with a mutation in PMS2 gene   Genetic testing 04/22/2018   Multi-Cancer panel (83 genes) @ [facility] - No pathogenic mutations detected   Hyperlipidemia    Hypertension    Hypothyroidism    Obesity    Polycythemia    Prediabetes    Status post patch closure of ASD 02/09/2014   Tobacco abuse     PAST SURGICAL HISTORY:  Past Surgical History:  Procedure Laterality Date   ASD REPAIR  1972   CARDIAC CATHETERIZATION  08/28/2009   EF 60%; Mild CAD with normal LV function   COLONOSCOPY  2011   outlaw; 5 polyps    STRESS ECHO TEST  2006   NO ISCHEMIA, NORMAL   TONSILLECTOMY      FAMILY HISTORY: The patient family history includes Breast cancer in his cousin and another family member; COPD in his father; Cancer (age of onset: 51) in his paternal uncle; Heart attack in his mother; Heart failure in his mother; Hypertension in his father; Lung cancer in his father; Prostate cancer (age of onset: 91) in his maternal uncle; Stomach cancer in his paternal uncle.  SOCIAL  HISTORY:  The patient  reports that he has been smoking cigarettes. He has a 15.00 pack-year smoking history. He has never used smokeless tobacco. He reports that he does not drink alcohol and does not use drugs.  REVIEW OF SYSTEMS: Review of Systems  Cardiovascular:  Negative for chest pain, claudication, dyspnea on exertion, irregular heartbeat, leg swelling, near-syncope, orthopnea, palpitations, paroxysmal nocturnal dyspnea and syncope.  Respiratory:  Negative for shortness of breath.   Hematologic/Lymphatic: Negative for bleeding problem.  Musculoskeletal:  Negative for muscle cramps and myalgias.  Neurological:  Negative for dizziness and light-headedness.    PHYSICAL EXAM:    07/04/2022   12:50 PM 07/04/2022   12:03 PM 07/04/2022   10:14 AM  Vitals with BMI  Height  6' 1"  6' 1"   Weight  223 lbs 13 oz 223 lbs  BMI  66.29 47.65  Systolic 465 035 465  Diastolic 83 82 86  Pulse 83 80 85   Physical Exam  Constitutional: No distress.  Age appropriate, hemodynamically stable.   Neck: No JVD present.  Cardiovascular: Normal rate, regular rhythm, S1 normal, S2 normal, intact distal pulses and normal pulses. Exam reveals no gallop, no S3 and no S4.  No murmur heard. Pulses:      Dorsalis pedis pulses are 2+ on the right side and 2+ on the left side.       Posterior tibial pulses are 2+ on the right side and 2+ on the left side.  No abdominal bruits appreciated.  Pulmonary/Chest: Effort normal and breath sounds normal. No stridor. He has no wheezes. He has no rales.  Abdominal: Soft. Bowel sounds are normal. He exhibits no distension. There is no abdominal tenderness.  Musculoskeletal:        General: No edema.     Cervical back: Neck supple.  Neurological: He is alert and oriented to person, place, and time. He has intact cranial nerves (2-12).  Skin: Skin is warm and moist.   CARDIAC DATABASE: EKG: 07/04/2022: Normal sinus rhythm, 85 bpm, without underlying ischemia or injury  pattern.   Echocardiogram: 11/13/2014: - Left ventricle: The cavity size was normal. Systolic function was    normal. The estimated ejection fraction was 55%. Wall motion was    normal; there were no regional wall motion abnormalities.  - Left atrium: The atrium was mildly dilated.  - Atrial septum: NO obvious PFO/ASD but not well seen.   Stress Testing: No results found for this or any previous visit from the past 1095 days.   Heart Catheterization: None  LABORATORY DATA:    Latest Ref Rng & Units 07/04/2022   11:50 AM 02/25/2022    2:20 PM 11/06/2021    2:09 PM  CBC  WBC 4.0 - 10.5 K/uL  8.4  9.2  10.2   Hemoglobin 13.0 - 17.0 g/dL 18.1  16.9  17.1   Hematocrit 39.0 - 52.0 % 52.9  51.4  51.6   Platelets 150 - 400 K/uL 191  158  231        Latest Ref Rng & Units 09/13/2014    1:15 PM 11/12/2012   10:35 AM 08/29/2009    3:46 AM  CMP  Glucose 70 - 99 mg/dL 114   127   BUN 6 - 23 mg/dL 15   14 DELTA CHECK NOTED   Creatinine 0.50 - 1.35 mg/dL 0.90   1.02   Sodium 137 - 147 mEq/L 139   142   Potassium 3.7 - 5.3 mEq/L 4.7   4.1   Chloride 96 - 112 mEq/L 102   108   CO2 19 - 32 mEq/L 23   26   Calcium 8.4 - 10.5 mg/dL 9.7   8.8   Total Protein 6.0 - 8.3 g/dL 7.2  6.7    Total Bilirubin 0.3 - 1.2 mg/dL 0.3  0.6    Alkaline Phos 39 - 117 U/L 60  65    AST 0 - 37 U/L 21  17    ALT 0 - 53 U/L 32  23      Lipid Panel     Component Value Date/Time   CHOL  08/28/2009 0355    196        ATP III CLASSIFICATION:  <200     mg/dL   Desirable  200-239  mg/dL   Borderline High  >=240    mg/dL   High          TRIG 235 (H) 08/28/2009 0355   HDL 33 (L) 08/28/2009 0355   CHOLHDL 5.9 08/28/2009 0355   VLDL 47 (H) 08/28/2009 0355   LDLCALC (H) 08/28/2009 0355    116        Total Cholesterol/HDL:CHD Risk Coronary Heart Disease Risk Table                     Men   Women  1/2 Average Risk   3.4   3.3  Average Risk       5.0   4.4  2 X Average Risk   9.6   7.1  3 X Average Risk   23.4   11.0        Use the calculated Patient Ratio above and the CHD Risk Table to determine the patient's CHD Risk.        ATP III CLASSIFICATION (LDL):  <100     mg/dL   Optimal  100-129  mg/dL   Near or Above                    Optimal  130-159  mg/dL   Borderline  160-189  mg/dL   High  >190     mg/dL   Very High    No components found for: "NTPROBNP" No results for input(s): "PROBNP" in the last 8760 hours. No results for input(s): "TSH" in the last 8760 hours.  BMP No results for input(s): "NA", "K", "CL", "CO2", "GLUCOSE", "BUN", "CREATININE", "CALCIUM", "GFRNONAA", "GFRAA" in the last 8760 hours.  HEMOGLOBIN A1C No results found for: "HGBA1C", "MPG"  External Labs: Collected: 06/24/2022 provided by referring physician. Hemoglobin A1c 7.2  Lipid Panel[[id number]]     Collected: 11/11/2021 04:34 PM     Specimen Received: 11/11/2021 05:00 AM  Cholesterol, Total [001065] 106 mg/dL Normal 100-199 mg/dL Triglycerides [001172] 169 mg/dL H 0-149 mg/dL HDL Cholesterol [011817] 42 mg/dL Normal >39 mg/dL VLDL Cholesterol Cal [011919] 28 mg/dL Normal 5-40 mg/dL LDL Chol Calc (NIH) [012059] 36 mg/dL Normal 0-99 mg/dL  Comp. Metabolic Panel (86)[[id number]]     Collected: 05/13/2021 09:05 PM     Specimen Received: 05/13/2021 05:00 AM       Glucose [001032] 86 mg/dL Normal 65-99 mg/dL  BUN [001040] 22 mg/dL Normal 6-24 mg/dL  Creatinine [001370] 1.02 mg/dL Normal 0.76-1.27 mg/dL  eGFR [100779] 85 mL/min/1.73 Normal >59 mL/min/1.73  BUN/Creatinine Ratio [011577] 22 H 9-20  Sodium [001198] 140 mmol/L Normal 134-144 mmol/L  Potassium [001180] 4.1 mmol/L Normal 3.5-5.2 mmol/L  Chloride [001206] 101 mmol/L Normal 96-106 mmol/L  Carbon Dioxide, Total [001578] 23 mmol/L Normal 20-29 mmol/L  Calcium [001016] 9.6 mg/dL Normal 8.7-10.2 mg/dL  Protein, Total [001073] 7.0 g/dL Normal 6.0-8.5 g/dL  Albumin [001081] 4.8 g/dL Normal 3.8-4.9 g/dL  Globulin, Total [012039] 2.2 g/dL Normal  1.5-4.5 g/dL  A/G Ratio [012047] 2.2 Normal 1.2-2.2  Bilirubin, Total [001099] 0.4 mg/dL Normal 0.0-1.2 mg/dL  Alkaline Phosphatase [001107] 55 IU/L Normal 44-121 IU/L  AST (SGOT) [001123] 12 IU/L Normal 0-40 IU/L  ALT (SGPT) [001545] 16 IU/L Normal 0-44 IU/L   External Labs: Collected: 09/17/2021 provided by patient. Total cholesterol 172, triglycerides 221, HDL 37, LDL 91, non-HDL 135.   IMPRESSION:    ICD-10-CM   1. Coronary artery disease involving native coronary artery of native heart without angina pectoris  I25.10 EKG 12-Lead    PCV CARDIAC STRESS TEST    ECHOCARDIOGRAM COMPLETE BUBBLE STUDY    2. H/O congenital atrial septal defect (ASD) repair  Z87.74 ECHOCARDIOGRAM COMPLETE BUBBLE STUDY    3. Non-insulin dependent type 2 diabetes mellitus ([place])  E11.9     4. Mixed hyperlipidemia  E78.2     5. Cigarette smoker  F17.210        RECOMMENDATIONS: WILLIS HOLQUIN is a 61 y.o.  male whose past medical history and cardiac risk factors include: Non-insulin-dependent diabetes mellitus type 2, hypertension, hyperlipidemia, mild CAD per cath 2015, history of ASD repair 1972, cigarette smoking.   Coronary artery disease involving native coronary artery of native heart without angina pectoris Noted to have mild CAD per angiography in 2015. Denies angina pectoris. EKG nonischemic. Recommended the importance of improving his modifiable cardiovascular risk factors. Discussed the importance of blood pressure management, glycemic control, lipid management. Would recommend LDL less than 70 mg/dL in the setting of diabetes and mild CAD. Would recommend a goal SBP less than or equal to 130 mmHg. Reemphasized importance of complete smoking cessation. His last ischemic evaluation was in 2015.  Shared decision was to proceed with an echocardiogram and exercise treadmill stress test for further risk stratification.  H/O congenital atrial septal defect (ASD) repair Repair in  [place] has episodes of palpitations likely secondary to PACs/PVCs Currently on Lopressor.  Recommend transitioning to Toprol-XL when due for refill. Will plan for an echocardiogram with bubble study.  Non-insulin dependent type 2 diabetes mellitus ([place]) Most recent hemoglobin A1c within acceptable range but room for improvement. Currently managed by primary care provider.  Mixed hyperlipidemia Currently on rosuvastatin-unsure if he is on 20 mg or 40 mg tablets. Recommended LDL less than 70 mg/dL. He is to have repeat labs with PCP in November 2023 and will bring a copy with him at the next visit.  Cigarette smoker Tobacco cessation counseling: Currently smoking  1 packs/day   Patient is willing to quit at this time. 3 mins were spent counseling patient cessation techniques. We discussed various methods to help quit smoking, including deciding on a date to quit, joining a support group, pharmacological agents- nicotine gum/patch/lozenges.  I will reassess his progress at the next follow-up visit  Data Reviewed: I have independently reviewed external notes provided by the referring provider as part of this office visit.   I have independently reviewed outside labs, EKG as part of medical decision making. I have ordered the following tests:  Orders Placed This Encounter  Procedures   PCV CARDIAC STRESS TEST    Standing Status:   Future    Standing Expiration Date:   07/05/2023   EKG 12-Lead   ECHOCARDIOGRAM COMPLETE BUBBLE STUDY    Standing Status:   Future    Standing Expiration Date:   07/05/2023    Order Specific Question:   Where should this test be performed    Answer:   [facility]    Order Specific Question:   Perflutren DEFINITY (image enhancing agent) should be administered unless hypersensitivity or allergy exist    Answer:   Administer Perflutren    Order Specific Question:   Reason for exam    Answer:   ASD (atrial septal defect) 745.5 / Q21.1  I have made no  medications changes at today's encounter as noted above. History of present illness was obtained by both patient and his partner John at today's office visit.   FINAL MEDICATION LIST END OF ENCOUNTER: No orders of the defined types were placed in this encounter.   Medications Discontinued During This Encounter  Medication Reason   lisinopril (PRINIVIL,ZESTRIL) 10 MG tablet      Current Outpatient Medications:    Apple Cider Vinegar 188 MG CAPS, Take 2 capsules by mouth daily at 6 (six) AM., Disp: , Rfl:    aspirin 325 MG tablet, Take 325 mg by mouth daily., Disp: , Rfl:    Black Pepper-Turmeric (TURMERIC CURCUMIN) 03-999 MG CAPS, Take 2 Capfuls by mouth daily at 6 (six) AM., Disp: , Rfl:    Calcium Polycarbophil (FIBER-CAPS PO), Take by mouth., Disp: , Rfl:    CINNAMON PO, Take 2 capsules by mouth daily., Disp: , Rfl:    co-enzyme Q-10 30 MG capsule, Take 1 capsule by mouth daily., Disp: , Rfl:    dapagliflozin propanediol (FARXIGA) 10 MG TABS tablet, , Disp: , Rfl:    ezetimibe (ZETIA) 10 MG tablet, Take by mouth., Disp: , Rfl:    levothyroxine (SYNTHROID, LEVOTHROID) 88 MCG tablet, Take 112 mcg by mouth daily., Disp: , Rfl:    losartan (COZAAR) 25 MG tablet, Take 12.5 mg by mouth daily., Disp: , Rfl:    metFORMIN (GLUCOPHAGE-XR) 500 MG 24 hr tablet, Take 500 mg by mouth daily., Disp: , Rfl: 6   metoprolol (LOPRESSOR) 50 MG tablet, Take 0.5 tablets (25 mg total) by mouth 2 (two) times daily., Disp: 90 tablet, Rfl: 3   OVER THE COUNTER MEDICATION, Take 1 capsule by mouth daily. [place] Tail Mushrooms, Disp: , Rfl:    OZEMPIC, 0.25 OR 0.5 MG/DOSE, 2 MG/3ML SOPN, Inject into the skin., Disp: , Rfl:    Rosuvastatin Calcium 40 MG CPSP, Take 40 mg by mouth daily., Disp: , Rfl:   Orders Placed This Encounter  Procedures   PCV CARDIAC STRESS TEST   EKG 12-Lead   ECHOCARDIOGRAM COMPLETE BUBBLE STUDY    There are no Patient Instructions on  file for this visit.   --Continue cardiac  medications as reconciled in final medication list. --Return in about 4 months (around 11/10/2022) for Follow up, CAD, s/p ASD repair. or sooner if needed. --Continue follow-up with your primary care physician regarding the management of your other chronic comorbid conditions.  Patient's questions and concerns were addressed to his satisfaction. He voices understanding of the instructions provided during this encounter.   This note was created using a voice recognition software as a result there may be grammatical errors inadvertently enclosed that do not reflect the nature of this encounter. Every attempt is made to correct such errors.  Rex Kras, [place], [facility]  Pager: [phone] Office: [phone]

## 2022-07-04 NOTE — Assessment & Plan Note (Addendum)
The patient has not quit smoking yet but he is motivated. 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 I recommend 1 unit of phlebotomy every 4 months to keep hemoglobin less than 16 g He will continue aspirin therapy to reduce risk of blood clots He inform me of recent changes at the blood bank guidelines for blood donors; he will make some inquiries but if he can donate blood at the local blood bank, he does not need to return here long-term

## 2022-07-04 NOTE — Assessment & Plan Note (Signed)
We discussed various strategies to help him quit smoking I also encourage lifestyle modification, dietary change and weight loss as tolerated

## 2022-09-18 DIAGNOSIS — D3132 Benign neoplasm of left choroid: Secondary | ICD-10-CM | POA: Diagnosis not present

## 2022-09-29 DIAGNOSIS — E785 Hyperlipidemia, unspecified: Secondary | ICD-10-CM | POA: Diagnosis not present

## 2022-09-29 DIAGNOSIS — E039 Hypothyroidism, unspecified: Secondary | ICD-10-CM | POA: Diagnosis not present

## 2022-09-29 DIAGNOSIS — Z125 Encounter for screening for malignant neoplasm of prostate: Secondary | ICD-10-CM | POA: Diagnosis not present

## 2022-09-29 DIAGNOSIS — R739 Hyperglycemia, unspecified: Secondary | ICD-10-CM | POA: Diagnosis not present

## 2022-10-03 ENCOUNTER — Ambulatory Visit: Payer: BC Managed Care – PPO

## 2022-10-03 DIAGNOSIS — I2510 Atherosclerotic heart disease of native coronary artery without angina pectoris: Secondary | ICD-10-CM

## 2022-10-06 DIAGNOSIS — Z23 Encounter for immunization: Secondary | ICD-10-CM | POA: Diagnosis not present

## 2022-10-06 DIAGNOSIS — R82998 Other abnormal findings in urine: Secondary | ICD-10-CM | POA: Diagnosis not present

## 2022-10-06 DIAGNOSIS — E1122 Type 2 diabetes mellitus with diabetic chronic kidney disease: Secondary | ICD-10-CM | POA: Diagnosis not present

## 2022-10-06 DIAGNOSIS — Z0000 Encounter for general adult medical examination without abnormal findings: Secondary | ICD-10-CM | POA: Diagnosis not present

## 2022-10-08 ENCOUNTER — Other Ambulatory Visit: Payer: Self-pay | Admitting: Internal Medicine

## 2022-10-08 ENCOUNTER — Ambulatory Visit: Payer: BC Managed Care – PPO

## 2022-10-08 DIAGNOSIS — I2510 Atherosclerotic heart disease of native coronary artery without angina pectoris: Secondary | ICD-10-CM | POA: Diagnosis not present

## 2022-10-08 DIAGNOSIS — Z8774 Personal history of (corrected) congenital malformations of heart and circulatory system: Secondary | ICD-10-CM

## 2022-10-08 DIAGNOSIS — F17210 Nicotine dependence, cigarettes, uncomplicated: Secondary | ICD-10-CM

## 2022-10-14 ENCOUNTER — Encounter: Payer: Self-pay | Admitting: Hematology and Oncology

## 2022-10-20 NOTE — Progress Notes (Signed)
Called and spoke with patient regarding his stress test results.

## 2022-10-28 ENCOUNTER — Other Ambulatory Visit: Payer: Self-pay | Admitting: Cardiology

## 2022-10-28 DIAGNOSIS — F17210 Nicotine dependence, cigarettes, uncomplicated: Secondary | ICD-10-CM

## 2022-10-28 DIAGNOSIS — Z8774 Personal history of (corrected) congenital malformations of heart and circulatory system: Secondary | ICD-10-CM

## 2022-10-28 DIAGNOSIS — E782 Mixed hyperlipidemia: Secondary | ICD-10-CM

## 2022-10-28 DIAGNOSIS — I2510 Atherosclerotic heart disease of native coronary artery without angina pectoris: Secondary | ICD-10-CM

## 2022-10-28 DIAGNOSIS — E119 Type 2 diabetes mellitus without complications: Secondary | ICD-10-CM

## 2022-11-02 NOTE — Progress Notes (Unsigned)
ID:  Chris Long, DOB 11/24/1961, MRN [id number]  PCP:  [name], MD  Cardiologist:  Rex Kras, DO, [facility] (established care 07/04/2022) Former Cardiology Providers: Dr. Martinique  Date: 11/03/22 Last Office Visit: 07/04/2022  Chief Complaint  Patient presents with   Follow-up    Hx of ASD repair  Review echo and stress test     HPI  Chris Long is a 61 y.o.  male whose past medical history and cardiovascular risk factors include: Non-insulin-dependent diabetes mellitus type 2, hypertension, hyperlipidemia, mild CAD per cath 2015, history of ASD repair 1972, cigarette smoking.   Patient was referred to the practice given his history of ASD repair and mild CAD per his primary care provider back in August 2023.  He underwent ASD repair in 1972 and was noted to have mild CAD based on angiography back in 2015.  Echocardiogram was last office visit notes preserved LVEF, grade 1 diastolic impairment, and bubble study negative for PFO or ASD and GXT was low risk.   Since last office visit patient is doing well from a cardiovascular standpoint.  He denies anginal discomfort or heart failure symptoms.  He unfortunately continues to smoke 0.75 packs/day.  He is motivated to quit.  Since last visit he did have labs with PCP.Marland Kitchen  Mother had a myocardial infarction at the age of 12 and also had ASD.  FUNCTIONAL STATUS: Walks 2 miles at least 4-5 days a week.  ALLERGIES: No Known Allergies  MEDICATION LIST PRIOR TO VISIT: Current Meds  Medication Sig   Apple Cider Vinegar 188 MG CAPS Take 2 capsules by mouth daily at 6 (six) AM.   aspirin EC 81 MG tablet Take 1 tablet (81 mg total) by mouth daily. Swallow whole.   Black Pepper-Turmeric (TURMERIC CURCUMIN) 03-999 MG CAPS Take 2 Capfuls by mouth daily at 6 (six) AM.   Calcium Polycarbophil (FIBER-CAPS PO) Take by mouth.   CINNAMON PO Take 2 capsules by mouth daily.   co-enzyme Q-10 30 MG capsule Take 1 capsule by mouth daily.    dapagliflozin propanediol (FARXIGA) 10 MG TABS tablet    ezetimibe (ZETIA) 10 MG tablet Take by mouth.   levothyroxine (SYNTHROID, LEVOTHROID) 88 MCG tablet Take 112 mcg by mouth daily.   losartan (COZAAR) 25 MG tablet Take 12.5 mg by mouth daily.   metFORMIN (GLUCOPHAGE-XR) 500 MG 24 hr tablet Take 500 mg by mouth daily.   metoprolol (LOPRESSOR) 50 MG tablet Take 0.5 tablets (25 mg total) by mouth 2 (two) times daily.   OVER THE COUNTER MEDICATION Take 1 capsule by mouth daily. [place] Tail Mushrooms   OZEMPIC, 0.25 OR 0.5 MG/DOSE, 2 MG/3ML SOPN Inject into the skin.   Rosuvastatin Calcium 40 MG CPSP Take 40 mg by mouth daily.   [DISCONTINUED] aspirin 325 MG tablet Take 325 mg by mouth daily.     PAST MEDICAL HISTORY: Past Medical History:  Diagnosis Date   ASD (atrial septal defect)    with repair in 1972   Coronary artery disease    Mild CAD per cath in 2010   Cough, persistent 02/22/2016   Family history of Lynch syndrome    maternal relatives with a mutation in PMS2 gene   Genetic testing 04/22/2018   Multi-Cancer panel (83 genes) @ [facility] - No pathogenic mutations detected   Hyperlipidemia    Hypertension    Hypothyroidism    Obesity    Polycythemia    Prediabetes    Status post patch  closure of ASD 02/09/2014   Tobacco abuse     PAST SURGICAL HISTORY: Past Surgical History:  Procedure Laterality Date   ASD REPAIR  1972   CARDIAC CATHETERIZATION  08/28/2009   EF 60%; Mild CAD with normal LV function   COLONOSCOPY  2011   outlaw; 5 polyps    STRESS ECHO TEST  2006   NO ISCHEMIA, NORMAL   TONSILLECTOMY      FAMILY HISTORY: The patient family history includes Breast cancer in his cousin and another family member; COPD in his father; Cancer (age of onset: 55) in his paternal uncle; Heart attack in his mother; Heart failure in his mother; Hypertension in his father; Lung cancer in his father; Prostate cancer (age of onset: 44) in his maternal uncle; Stomach cancer in  his paternal uncle.  SOCIAL HISTORY:  The patient  reports that he has been smoking cigarettes. He has a 15.00 pack-year smoking history. He has never used smokeless tobacco. He reports that he does not drink alcohol and does not use drugs.  REVIEW OF SYSTEMS: Review of Systems  Cardiovascular:  Negative for chest pain, claudication, dyspnea on exertion, irregular heartbeat, leg swelling, near-syncope, orthopnea, palpitations, paroxysmal nocturnal dyspnea and syncope.  Respiratory:  Negative for shortness of breath.   Hematologic/Lymphatic: Negative for bleeding problem.  Musculoskeletal:  Negative for muscle cramps and myalgias.  Neurological:  Negative for dizziness and light-headedness.    PHYSICAL EXAM:    11/03/2022    8:46 AM 07/04/2022   12:50 PM 07/04/2022   12:03 PM  Vitals with BMI  Height _0   _1   Weight 223 lbs 13 oz  223 lbs 13 oz  BMI 34.19  37.90  Systolic 240 973 532  Diastolic 79 83 82  Pulse 85 83 80   Physical Exam  Constitutional: No distress.  Age appropriate, hemodynamically stable.   Neck: No JVD present.  Cardiovascular: Normal rate, regular rhythm, S1 normal, S2 normal, intact distal pulses and normal pulses. Exam reveals no gallop, no S3 and no S4.  No murmur heard. Pulses:      Dorsalis pedis pulses are 2+ on the right side and 2+ on the left side.       Posterior tibial pulses are 2+ on the right side and 2+ on the left side.  No abdominal bruits appreciated.  Pulmonary/Chest: Effort normal and breath sounds normal. No stridor. He has no wheezes. He has no rales.  Abdominal: Soft. Bowel sounds are normal. He exhibits no distension. There is no abdominal tenderness.  Musculoskeletal:        General: No edema.     Cervical back: Neck supple.  Neurological: He is alert and oriented to person, place, and time. He has intact cranial nerves (2-12).  Skin: Skin is warm and moist.   CARDIAC DATABASE: EKG: 07/04/2022: Normal sinus rhythm, 85 bpm,  without underlying ischemia or injury pattern.   Echocardiogram: 10/08/2022:  1. Left ventricle cavity is normal in size and wall thickness. Normal global wall motion. Normal LV systolic function with EF 53%. Doppler evidence of grade I diastolic dysfunction, normal LAP.  2. Mild tricuspid regurgitation.  3. No evidence of pulmonary hypertension.  4. Agitated saline contrast was injected into a peripheral vein. No intracardiac shunt was seen. A patient foramen ovale was not seen.   Stress Testing: Exercise treadmill stress test 10/03/2022:  Exercise treadmill stress test performed using Bruce protocol. Patient reached 8.5 METS, and 91% of age predicted maximum heart  rate. Exercise capacity was fair. No chest pain reported. Normal heart rate and hemodynamic response. Stress EKG revealed no ischemic changes. Only occasional PVC. Low risk study.    Heart Catheterization: None  LABORATORY DATA:    Latest Ref Rng & Units 07/04/2022   11:50 AM 02/25/2022    2:20 PM 11/06/2021    2:09 PM  CBC  WBC 4.0 - 10.5 K/uL 8.4  9.2  10.2   Hemoglobin 13.0 - 17.0 g/dL 18.1  16.9  17.1   Hematocrit 39.0 - 52.0 % 52.9  51.4  51.6   Platelets 150 - 400 K/uL 191  158  231        Latest Ref Rng & Units 09/13/2014    1:15 PM 11/12/2012   10:35 AM 08/29/2009    3:46 AM  CMP  Glucose 70 - 99 mg/dL 114   127   BUN 6 - 23 mg/dL 15   14 DELTA CHECK NOTED   Creatinine 0.50 - 1.35 mg/dL 0.90   1.02   Sodium 137 - 147 mEq/L 139   142   Potassium 3.7 - 5.3 mEq/L 4.7   4.1   Chloride 96 - 112 mEq/L 102   108   CO2 19 - 32 mEq/L 23   26   Calcium 8.4 - 10.5 mg/dL 9.7   8.8   Total Protein 6.0 - 8.3 g/dL 7.2  6.7    Total Bilirubin 0.3 - 1.2 mg/dL 0.3  0.6    Alkaline Phos 39 - 117 U/L 60  65    AST 0 - 37 U/L 21  17    ALT 0 - 53 U/L 32  23      Lipid Panel     Component Value Date/Time   CHOL  08/28/2009 0355    196        ATP III CLASSIFICATION:  <200     mg/dL   Desirable  200-239  mg/dL    Borderline High  >=240    mg/dL   High          TRIG 235 (H) 08/28/2009 0355   HDL 33 (L) 08/28/2009 0355   CHOLHDL 5.9 08/28/2009 0355   VLDL 47 (H) 08/28/2009 0355   LDLCALC (H) 08/28/2009 0355    116        Total Cholesterol/HDL:CHD Risk Coronary Heart Disease Risk Table                     Men   Women  1/2 Average Risk   3.4   3.3  Average Risk       5.0   4.4  2 X Average Risk   9.6   7.1  3 X Average Risk  23.4   11.0        Use the calculated Patient Ratio above and the CHD Risk Table to determine the patient's CHD Risk.        ATP III CLASSIFICATION (LDL):  <100     mg/dL   Optimal  100-129  mg/dL   Near or Above                    Optimal  130-159  mg/dL   Borderline  160-189  mg/dL   High  >190     mg/dL   Very High    No components found for: "NTPROBNP" No results for input(s): "PROBNP" in the last 8760 hours. No results for input(s): "TSH" in  the last 8760 hours.  BMP No results for input(s): "NA", "K", "CL", "CO2", "GLUCOSE", "BUN", "CREATININE", "CALCIUM", "GFRNONAA", "GFRAA" in the last 8760 hours.  HEMOGLOBIN A1C No results found for: "HGBA1C", "MPG"  External Labs: Collected: 06/24/2022 provided by referring physician. Hemoglobin A1c 7.2  External Labs: Collected: 09/17/2021 provided by patient. Total cholesterol 172, triglycerides 221, HDL 37, LDL 91, non-HDL 135.  External Labs: Collected: 09/29/2022 provided by the patient BUN 19, creatinine 1. eGFR 76 mL/min. Sodium 139, potassium 4.6, chloride 104, bicarb 25 AST 22, ALT 31, alkaline phosphatase 56. TSH 3.79 A1c 6.8 Total cholesterol 97, triglycerides 180, HDL 34, LDL 27, non-HDL 63    IMPRESSION:    ICD-10-CM   1. Coronary artery disease involving native coronary artery of native heart without angina pectoris  I25.10 aspirin EC 81 MG tablet    2. H/O congenital atrial septal defect (ASD) repair  Z87.74 aspirin EC 81 MG tablet    3. Non-insulin dependent type 2 diabetes mellitus  ([place])  E11.9     4. Mixed hyperlipidemia  E78.2     5. Cigarette smoker  F17.210     6. Encounter to discuss test results  Z71.2        RECOMMENDATIONS: HANISH LARAIA is a 61 y.o.  male whose past medical history and cardiac risk factors include: Non-insulin-dependent diabetes mellitus type 2, hypertension, hyperlipidemia, mild CAD per cath 2015, history of ASD repair 1972, cigarette smoking.   Coronary artery disease involving native coronary artery of native heart without angina pectoris Noted to have mild CAD per angiography in 2015. Denies angina pectoris. EKG nonischemic (last done w/n 6 months). GXT: Low risk  Echo: Preserved LVEF, grade 1 diastolic dysfunction, negative bubble study, no significant valvular heart disease.  Outside labs independently reviewed. Patient wants to reduce the dose of Crestor to 20 mg p.o. nightly-this is acceptable with the caveat that he has his lipids rechecked in 3 months to make sure that the LDL levels are still well-controlled.  Will defer management to PCP for now. Blood pressures are very well-controlled. A1c within acceptable range. Reemphasized the importance of complete smoking cessation.  H/O congenital atrial septal defect (ASD) repair Repair in 1972 Recent echo - negative bubble study.  Monitor for now.   Non-insulin dependent type 2 diabetes mellitus ([place]) Most recent hemoglobin A1c within acceptable range but room for improvement. Currently managed by primary care provider.  Mixed hyperlipidemia Outside labs from November 2023 independently reviewed, provided by the patient. LDL well-controlled. Patient would like to reduce the dose of rosuvastatin to 20 mg p.o. nightly.  This is acceptable with the exception that he has his lipids rechecked in 3 months to reevaluate lipids. Would recommend a goal LDL <70 mg/dL. Patient also understands that he needs to improve his triglyceride levels.  He is working on [employer].  Cigarette smoker Tobacco cessation counseling: Currently smoking 0.75 packs/day   Patient is willing to quit at this time. 3 mins were spent counseling patient cessation techniques. We discussed various methods to help quit smoking, including deciding on a date to quit, joining a support group, pharmacological agents- nicotine gum/patch/lozenges.  I will reassess his progress at the next follow-up visit  FINAL MEDICATION LIST END OF ENCOUNTER: Meds ordered this encounter  Medications   aspirin EC 81 MG tablet    Sig: Take 1 tablet (81 mg total) by mouth daily. Swallow whole.    Dispense:  30 tablet    Refill:  12  Medications Discontinued During This Encounter  Medication Reason   aspirin 325 MG tablet Dose change      Current Outpatient Medications:    Apple Cider Vinegar 188 MG CAPS, Take 2 capsules by mouth daily at 6 (six) AM., Disp: , Rfl:    aspirin EC 81 MG tablet, Take 1 tablet (81 mg total) by mouth daily. Swallow whole., Disp: 30 tablet, Rfl: 12   Black Pepper-Turmeric (TURMERIC CURCUMIN) 03-999 MG CAPS, Take 2 Capfuls by mouth daily at 6 (six) AM., Disp: , Rfl:    Calcium Polycarbophil (FIBER-CAPS PO), Take by mouth., Disp: , Rfl:    CINNAMON PO, Take 2 capsules by mouth daily., Disp: , Rfl:    co-enzyme Q-10 30 MG capsule, Take 1 capsule by mouth daily., Disp: , Rfl:    dapagliflozin propanediol (FARXIGA) 10 MG TABS tablet, , Disp: , Rfl:    ezetimibe (ZETIA) 10 MG tablet, Take by mouth., Disp: , Rfl:    levothyroxine (SYNTHROID, LEVOTHROID) 88 MCG tablet, Take 112 mcg by mouth daily., Disp: , Rfl:    losartan (COZAAR) 25 MG tablet, Take 12.5 mg by mouth daily., Disp: , Rfl:    metFORMIN (GLUCOPHAGE-XR) 500 MG 24 hr tablet, Take 500 mg by mouth daily., Disp: , Rfl: 6   metoprolol (LOPRESSOR) 50 MG tablet, Take 0.5 tablets (25 mg total) by mouth 2 (two) times daily., Disp: 90 tablet, Rfl: 3   OVER THE COUNTER MEDICATION, Take 1 capsule by mouth daily.  [place] Tail Mushrooms, Disp: , Rfl:    OZEMPIC, 0.25 OR 0.5 MG/DOSE, 2 MG/3ML SOPN, Inject into the skin., Disp: , Rfl:    Rosuvastatin Calcium 40 MG CPSP, Take 40 mg by mouth daily., Disp: , Rfl:   No orders of the defined types were placed in this encounter.   There are no Patient Instructions on file for this visit.   --Continue cardiac medications as reconciled in final medication list. --Return in about 1 year (around 11/04/2023) for Annual follow up visit hx of ASD repair, CAD, 2ndary prevention.. or sooner if needed. --Continue follow-up with your primary care physician regarding the management of your other chronic comorbid conditions.  Patient's questions and concerns were addressed to his satisfaction. He voices understanding of the instructions provided during this encounter.   This note was created using a voice recognition software as a result there may be grammatical errors inadvertently enclosed that do not reflect the nature of this encounter. Every attempt is made to correct such errors.  Rex Kras, [place], [facility]  Pager: [phone] Office: [phone]

## 2022-11-03 ENCOUNTER — Ambulatory Visit: Payer: BC Managed Care – PPO | Admitting: Cardiology

## 2022-11-03 ENCOUNTER — Encounter: Payer: Self-pay | Admitting: Cardiology

## 2022-11-03 VITALS — BP 122/79 | HR 85 | Resp 16 | Ht 73.0 in | Wt 223.8 lb

## 2022-11-03 DIAGNOSIS — E119 Type 2 diabetes mellitus without complications: Secondary | ICD-10-CM | POA: Diagnosis not present

## 2022-11-03 DIAGNOSIS — Z8774 Personal history of (corrected) congenital malformations of heart and circulatory system: Secondary | ICD-10-CM

## 2022-11-03 DIAGNOSIS — F17210 Nicotine dependence, cigarettes, uncomplicated: Secondary | ICD-10-CM

## 2022-11-03 DIAGNOSIS — E782 Mixed hyperlipidemia: Secondary | ICD-10-CM

## 2022-11-03 DIAGNOSIS — I2510 Atherosclerotic heart disease of native coronary artery without angina pectoris: Secondary | ICD-10-CM | POA: Diagnosis not present

## 2022-11-03 DIAGNOSIS — Z712 Person consulting for explanation of examination or test findings: Secondary | ICD-10-CM

## 2022-11-03 MED ORDER — ASPIRIN 81 MG PO TBEC: 81.0000 mg | DELAYED_RELEASE_TABLET | Freq: Every day | ORAL | 12 refills | Status: AC

## 2022-11-06 ENCOUNTER — Encounter: Payer: Self-pay | Admitting: Neurology

## 2022-11-07 ENCOUNTER — Inpatient Hospital Stay: Payer: BC Managed Care – PPO

## 2022-11-10 ENCOUNTER — Ambulatory Visit
Admission: RE | Admit: 2022-11-10 | Discharge: 2022-11-10 | Disposition: A | Discharge status: Home / Self Care | Payer: BC Managed Care – PPO | Source: Ambulatory Visit | Attending: Internal Medicine | Admitting: Internal Medicine

## 2022-11-10 DIAGNOSIS — F17210 Nicotine dependence, cigarettes, uncomplicated: Secondary | ICD-10-CM

## 2022-12-22 ENCOUNTER — Encounter: Payer: Self-pay | Admitting: Neurology

## 2022-12-22 ENCOUNTER — Ambulatory Visit (INDEPENDENT_AMBULATORY_CARE_PROVIDER_SITE_OTHER): Payer: BC Managed Care – PPO | Admitting: Neurology

## 2022-12-22 VITALS — BP 104/66 | HR 88 | Ht 73.0 in | Wt 223.8 lb

## 2022-12-22 DIAGNOSIS — D751 Secondary polycythemia: Secondary | ICD-10-CM | POA: Diagnosis not present

## 2022-12-22 DIAGNOSIS — Z8774 Personal history of (corrected) congenital malformations of heart and circulatory system: Secondary | ICD-10-CM

## 2022-12-22 DIAGNOSIS — G4736 Sleep related hypoventilation in conditions classified elsewhere: Secondary | ICD-10-CM | POA: Insufficient documentation

## 2022-12-22 DIAGNOSIS — D45 Polycythemia vera: Secondary | ICD-10-CM | POA: Diagnosis not present

## 2022-12-22 DIAGNOSIS — E119 Type 2 diabetes mellitus without complications: Secondary | ICD-10-CM | POA: Diagnosis not present

## 2022-12-22 NOTE — Progress Notes (Signed)
SLEEP MEDICINE CLINIC    Provider:  Larey Seat, MD  Primary Care Physician:  [name], MD [place] [address]     Referring Provider: [name], [place],  [place] [address]          Chief Complaint according to patient   Patient presents with:     New Patient (Initial Visit)           HISTORY OF PRESENT ILLNESS:  Chris Long is a 62 y.o. Caucasian male patient seen here as a referral on 12/22/2022 from Dr Virgina Jock for a sleep hypoxemia concerns .  Chief concern according to patient :  " I still smoke and I have high red blood cells"    I have the pleasure of seeing Chris Long today, a right-handed White or Caucasian male with a possible sleep disorder who has a past medical history of ASD (atrial septal defect), Coronary artery disease, Cough, persistent (02/22/2016), Family history of Lynch syndrome, Genetic testing (04/22/2018), Hyperlipidemia, Hypertension, Hypothyroidism, Obesity, Polycythemia, Prediabetes, Status post patch closure of ASD (02/09/2014), and Tobacco abuse.   The patient had the first sleep study in the year 2014  with a result of an AHI ( Apnea Hypopnea index)  of less than 5/ h,  Sleep relevant medical history: sleeping well for 5-6 hours until bathroom break, can't go back to sleep after Nocturia/ Enuresis,    Family medical /sleep history: No other family member on CPAP with OSA, insomnia, sleep walkers.    Social history:  Patient is retired from Personal assistant  and lives in a household with a partner . Tobacco use; since late teens.  ETOH use ; rarely,  Caffeine intake in form of Coffee( 3-4 a day) . Regular exercise in form of walking.   Hobbies : geneology       Sleep habits are as follows: The patient's dinner time is between 6 PM. The patient goes to bed at 9 PM and continues to sleep for  hours, wakes for one bathroom breaks, the first time at 2-4 AM.   The preferred sleep position is left  laterally, with the support of 2 pillows. Dreams are reportedly frequent.  6  AM is the usual rise time.  The patient wakes up spontaneously 5-6.  He reports not feeling refreshed or restored in AM, with symptoms such as dry mouth, . Naps are taken frequently, lasting from 30 to 60 minutes and are more refreshing than nocturnal sleep.    Review of Systems: Out of a complete 14 system review, the patient complains of only the following symptoms, and all other reviewed systems are negative.:  Fatigue, sleepiness , no snoring, fragmented sleep, Insomnia- one time  nocturia    How likely are you to doze in the following situations: 0 = not likely, 1 = slight chance, 2 = moderate chance, 3 = high chance   Sitting and Reading? Watching Television? Sitting inactive in a public place (theater or meeting)? As a passenger in a car for an hour without a break? Lying down in the afternoon when circumstances permit? Sitting and talking to someone? Sitting quietly after lunch without alcohol? In a car, while stopped for a few minutes in traffic?   Total = 12/ 24 points   FSS endorsed at 26/ 63 points.   Gds at 4/ 15 points.   Social History   Socioeconomic History   Marital status: Soil scientist  Spouse name: Not on file   Number of children: 0   Years of education: Not on file   Highest education level: Not on file  Occupational History    Comment: real estate.    Tobacco Use   Smoking status: Every Day    Packs/day: 0.50    Years: 30.00    Total pack years: 15.00    Types: Cigarettes   Smokeless tobacco: Never  Vaping Use   Vaping Use: Never used  Substance and Sexual Activity   Alcohol use: No   Drug use: No   Sexual activity: Yes  Other Topics Concern   Not on file  Social History Narrative   Not on file   Social Determinants of Health   Financial Resource Strain: Not on file  Food Insecurity: Not on file  Transportation Needs: Not on file  Physical Activity: Not  on file  Stress: Not on file  Social Connections: Not on file    Family History  Problem Relation Age of Onset   Heart attack Mother        X2   Heart failure Mother    Hypertension Father    COPD Father    Lung cancer Father        deceased 71; smoker   Cancer Paternal Uncle 92       GI cancer; deceased 29   Prostate cancer Maternal Uncle 69       deceased 64   Stomach cancer Paternal Uncle        deceased 87   Breast cancer Cousin        daughter of a unaffected maternal aunt; deceased 12   Breast cancer Other        mother's paternal half-sister    Past Medical History:  Diagnosis Date   ASD (atrial septal defect)    with repair in 1972   Coronary artery disease    Mild CAD per cath in 2010   Cough, persistent 02/22/2016   Family history of Lynch syndrome    maternal relatives with a mutation in PMS2 gene   Genetic testing 04/22/2018   Multi-Cancer panel (67 genes) @ [facility] - No pathogenic mutations detected   Hyperlipidemia    Hypertension    Hypothyroidism    Obesity    Polycythemia    Prediabetes    Status post patch closure of ASD 02/09/2014   Tobacco abuse     Past Surgical History:  Procedure Laterality Date   ASD REPAIR  1972   CARDIAC CATHETERIZATION  08/28/2009   EF 60%; Mild CAD with normal LV function   COLONOSCOPY  2011   outlaw; 5 polyps    STRESS ECHO TEST  2006   NO ISCHEMIA, NORMAL   TONSILLECTOMY       Current Outpatient Medications on File Prior to Visit  Medication Sig Dispense Refill   Apple Cider Vinegar 188 MG CAPS Take 2 capsules by mouth daily at 6 (six) AM.     aspirin EC 81 MG tablet Take 1 tablet (81 mg total) by mouth daily. Swallow whole. 30 tablet 12   Black Pepper-Turmeric (TURMERIC CURCUMIN) 03-999 MG CAPS Take 2 Capfuls by mouth daily at 6 (six) AM.     Calcium Polycarbophil (FIBER-CAPS PO) Take by mouth.     CINNAMON PO Take 2 capsules by mouth daily.     co-enzyme Q-10 30 MG capsule Take 1 capsule by mouth daily.      dapagliflozin propanediol (FARXIGA) 10  MG TABS tablet      ezetimibe (ZETIA) 10 MG tablet Take by mouth.     levothyroxine (SYNTHROID, LEVOTHROID) 88 MCG tablet Take 112 mcg by mouth daily.     losartan (COZAAR) 25 MG tablet Take 12.5 mg by mouth daily.     metFORMIN (GLUCOPHAGE-XR) 500 MG 24 hr tablet Take 500 mg by mouth daily.  6   metoprolol (LOPRESSOR) 50 MG tablet Take 0.5 tablets (25 mg total) by mouth 2 (two) times daily. 90 tablet 3   OVER THE COUNTER MEDICATION Take 1 capsule by mouth daily. [place] Tail Mushrooms     OZEMPIC, 0.25 OR 0.5 MG/DOSE, 2 MG/3ML SOPN Inject into the skin.     Rosuvastatin Calcium 40 MG CPSP Take 40 mg by mouth daily.     No current facility-administered medications on file prior to visit.    No Known Allergies  Physical exam:  Today's Vitals   12/22/22 0859  BP: 104/66  Pulse: 88  Weight: 223 lb 12.8 oz (101.5 kg)  Height: '6\' 1"'$  (1.854 m)   Body mass index is 29.53 kg/m.   Wt Readings from Last 3 Encounters:  12/22/22 223 lb 12.8 oz (101.5 kg)  11/03/22 223 lb 12.8 oz (101.5 kg)  07/04/22 223 lb 12.8 oz (101.5 kg)     Ht Readings from Last 3 Encounters:  12/22/22 '6\' 1"'$  (1.854 m)  11/03/22 '6\' 1"'$  (1.854 m)  07/04/22 '6\' 1"'$  (1.854 m)      General: The patient is awake, alert and appears not in acute distress. The patient is well groomed. Head: Normocephalic, atraumatic. Neck is supple. Mallampati 2-3,  neck circumference:17 inches . Nasal airflow is patent.  Retrognathia is not seen.  Dental status:  Cardiovascular:  Regular rate and cardiac rhythm by pulse,  without distended neck veins. Respiratory: Lungs are clear to auscultation.  Skin:  Without evidence of ankle edema, or rash. Trunk: The patient's posture is erect.   Neurologic exam : The patient is awake and alert, oriented to place and time.   Memory subjective described as intact.  Attention span & concentration ability appears normal.  Speech is fluent,  without   dysarthria, dysphonia or aphasia.  Mood and affect are appropriate.   Cranial nerves: no loss of smell or taste reported  Pupils are equal and briskly reactive to light. Funduscopic exam deferred.  Extraocular movements in vertical and horizontal planes were intact and without nystagmus. No Diplopia. Visual fields by finger perimetry are intact. Hearing was intact to soft voice and finger rubbing.    Facial sensation intact to fine touch.  Facial motor strength is symmetric and tongue and uvula move midline.  Neck ROM : rotation, tilt and flexion extension were normal for age and shoulder shrug was symmetrical.    Motor exam:  Symmetric bulk, tone and ROM.   Normal tone without cog wheeling, symmetric grip strength .   Sensory:  Fine touch, pinprick and vibration were tested  and  normal.  Proprioception tested in the upper extremities was normal.   Coordination: Rapid alternating movements in the fingers/hands were of normal speed.  The Finger-to-nose maneuver was intact without evidence of ataxia, dysmetria or tremor.   Gait and station: Patient could rise unassisted from a seated position, walked without assistive device.  Stance is of normal width/ base and the patient turned with 3 steps.  Toe and heel walk were deferred. Left hip pain reported.  Deep tendon reflexes: in the  upper and  lower extremities are symmetric and intact.  Babinski response was deferred.       After spending a total time of  45  minutes face to face and additional time for physical and neurologic examination, review of laboratory studies,  personal review of imaging studies, reports and results of other testing and review of referral information / records as far as provided in visit, I have established the following assessments:  1) reported hypoxemia per dr [facility] office, I quote consecutively 5 minutes of low 02 and over an hour total desaturation time.  2) Last time checked in 2014 he had an AHI of  12/h mild but could have used CPAP.  3) Dr Einar Gip would like a cc of any results.    My Plan is to proceed with:  1) In lab sleep study preferred for SPLIT at AHI 10 and to allow titration to oxygen as needed.  2) Rv in 3-4 months.   I would like to thank [name], MD and [name], [address] [place],  [place] [address] for allowing me to meet with and to take care of this pleasant patient.   In short, Chris Long is presenting with SLEEP HYPOXIA documented by an overnight pulseoximetry. And to initiate treatment if CPAP can't solve the problem.  I plan to follow up either personally or through our NP within 3-4 month.   CC: I will share my notes with Dr Virgina Jock and Dr Einar Gip. .  Electronically signed by: Larey Seat, MD 12/22/2022 9:09 AM  [facility] Board certified by The [employer] of Sleep Medicine and Diplomate of the [employer] of Sleep Medicine. Board certified In Neurology through the [place], Fellow of the [employer] of Neurology. Medical Director of [facility].

## 2022-12-22 NOTE — Patient Instructions (Signed)
Hypoxia Hypoxia is a condition that happens when there is a lack of oxygen in the body's tissues and organs. When there is not enough oxygen, organs cannot work as they should. This causes serious problems throughout the body and in the brain. What are the causes? This condition may be caused by: Exposure to high altitude. A collapsed lung (pneumothorax). Lung infection (pneumonia). Lung injury. Long-term (chronic) lung disease, such as chronic obstructive pulmonary disease (COPD) or emphysema. Fluid collecting in the chest cavity (congestive heart failure), or blood collecting in the chest cavity (hemothorax). Food, saliva, or vomit getting into the airway (aspiration). Reduced blood flow (ischemia). Severe blood loss. Slow or shallow breathing (hypoventilation). Blood disorders, such as anemia. Carbon monoxide or cyanide poisoning. The heart suddenly stopping (cardiac arrest). Medicines or recreational drugs with severe sedating effects. Drowning. Choking. What are the signs or symptoms? Symptoms of this condition include: Headache. Feeling tired (fatigue). Forgetfulness. Nausea. Confusion. Shortness of breath. Dizziness. Bluish color of the skin, lips, or nail beds (cyanosis). Change in consciousness or awareness. If hypoxia is not treated, it can lead to convulsions, loss of consciousness (coma), or brain damage, which can be life-threatening. How is this diagnosed? This condition may be diagnosed based on: A physical exam. Blood tests. A test that measures how much oxygen is in your blood (pulse oximetry). This is done with a sensor that is placed on your finger, toe, or earlobe. Imaging, such as a chest X-ray or CT scan. Tests to check your lung function (pulmonary function tests). A test to check the electrical activity of your heart (electrocardiogram, ECG). You may have other tests to determine the cause of your hypoxia. How is this treated?  Treatment for this  condition depends on what is causing the hypoxia. You will likely be treated with oxygen therapy. This may be done by giving you oxygen through a face mask or through tubes in your nose. Your health care provider may also recommend other therapies to treat the underlying cause of your hypoxia. Follow these instructions at home: Take over-the-counter and prescription medicines only as told by your health care provider. Do not use any products that contain nicotine or tobacco. These products include cigarettes, chewing tobacco, and vaping devices, such as e-cigarettes. If you need help quitting, ask your health care provider. Avoid secondhand smoke. Work with your health care provider to manage any chronic conditions you have that may be causing hypoxia, such as COPD. Keep all follow-up visits. This is important. Contact a health care provider if: You have a fever. You become extremely short of breath when you exercise. Get help right away if: Your shortness of breath gets worse, especially with normal or very little activity. You have trouble breathing, even after treatment. Your skin, lips, or nail beds have a bluish color. You become confused or you cannot think properly. You have chest pain. These symptoms may be an emergency. Get help right away. Call 911. Do not wait to see if the symptoms will go away. Do not drive yourself to the hospital. Summary Hypoxia is a condition that happens when there is a lack of oxygen in the body's tissues and organs. If hypoxia is not treated, it can lead to convulsions, loss of consciousness (coma), or brain damage. Symptoms of hypoxia can include a headache, shortness of breath, confusion, nausea, and a bluish skin color. Hypoxia has many possible causes, including exposure to high altitude, carbon monoxide poisoning, or other health issues, such as blood disorders or   cardiac arrest. Hypoxia is usually treated with oxygen therapy. This information is  not intended to replace advice given to you by your health care provider. Make sure you discuss any questions you have with your health care provider. Document Revised: 06/11/2021 Document Reviewed: 06/11/2021 Elsevier Patient Education  2023 Elsevier Inc.  

## 2023-01-12 ENCOUNTER — Telehealth: Payer: Self-pay | Admitting: Neurology

## 2023-01-12 NOTE — Telephone Encounter (Signed)
01/07/23 BCBS Josem Kaufmann: [id number] (exp. 01/07/23 to 03/07/23) EE  Needs SPLIT at AHI 10 and documentation if hypoxi emia is present or responding to CPAP alone, is there need to supplement oxygen.  VM not set up 01/07/23 [place]

## 2023-01-20 IMAGING — CR DG SKULL COMPLETE 4+V
4 series · 4 of 4 positions shown · non-contrast
Comparison: None.

CLINICAL DATA: 59-year-old male with 10 years of right scalp
swelling.

EXAM:
SKULL - COMPLETE 4 + VIEW

[[person_name] pa]
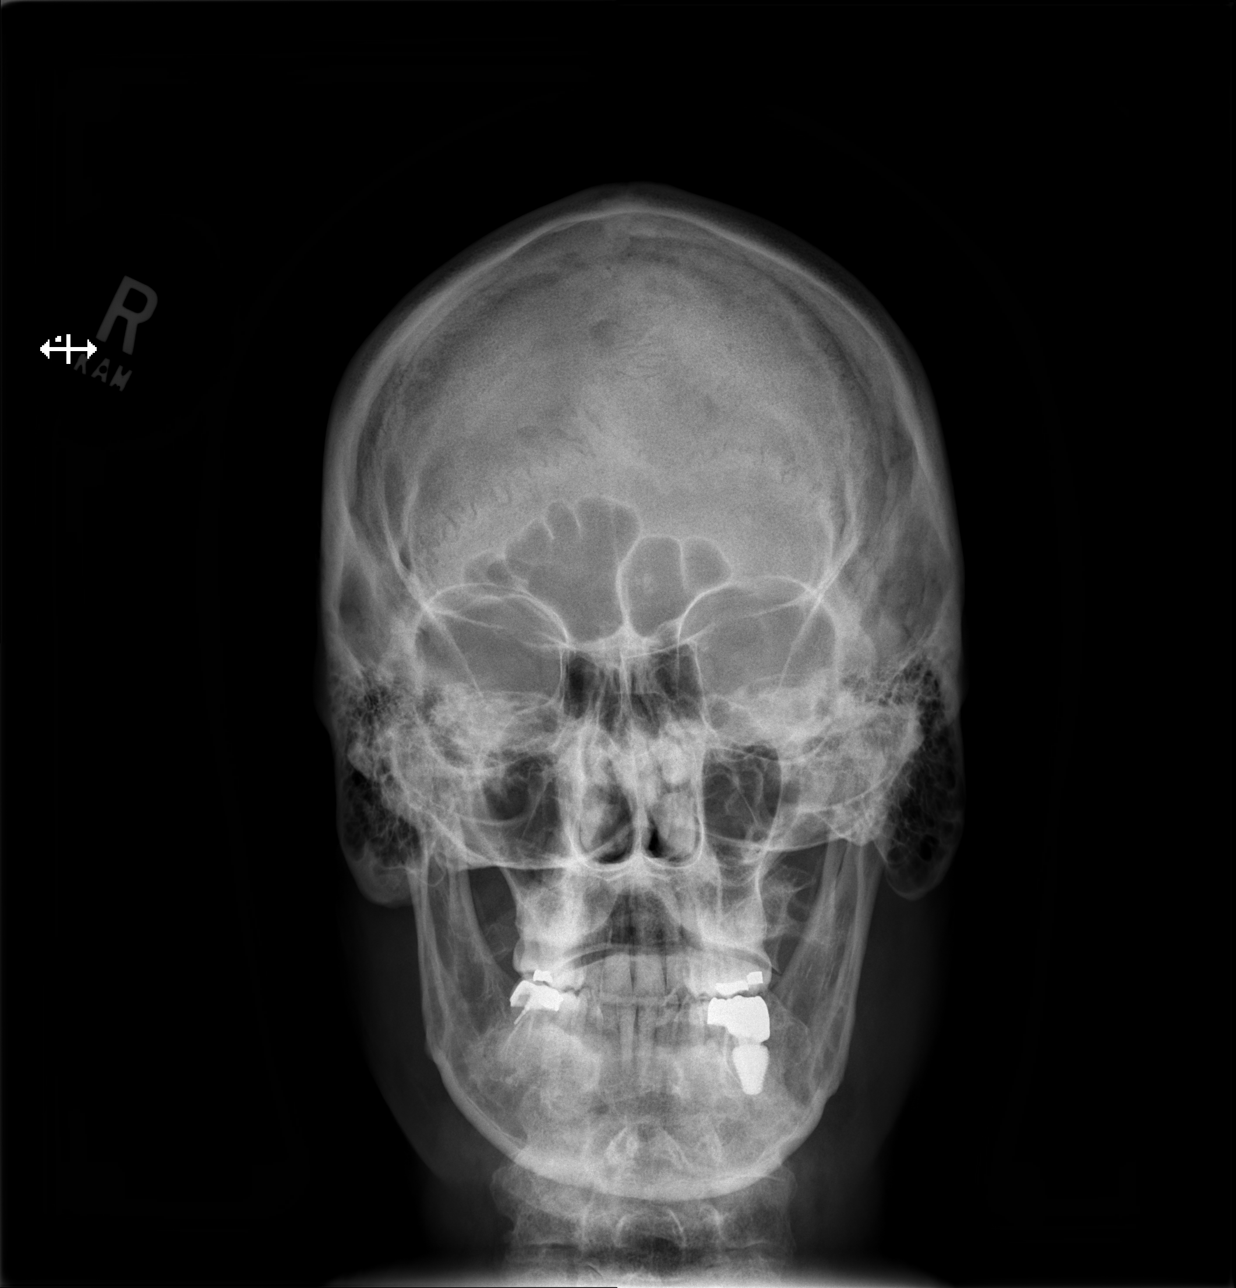

[w skull lat (1 of 2)]
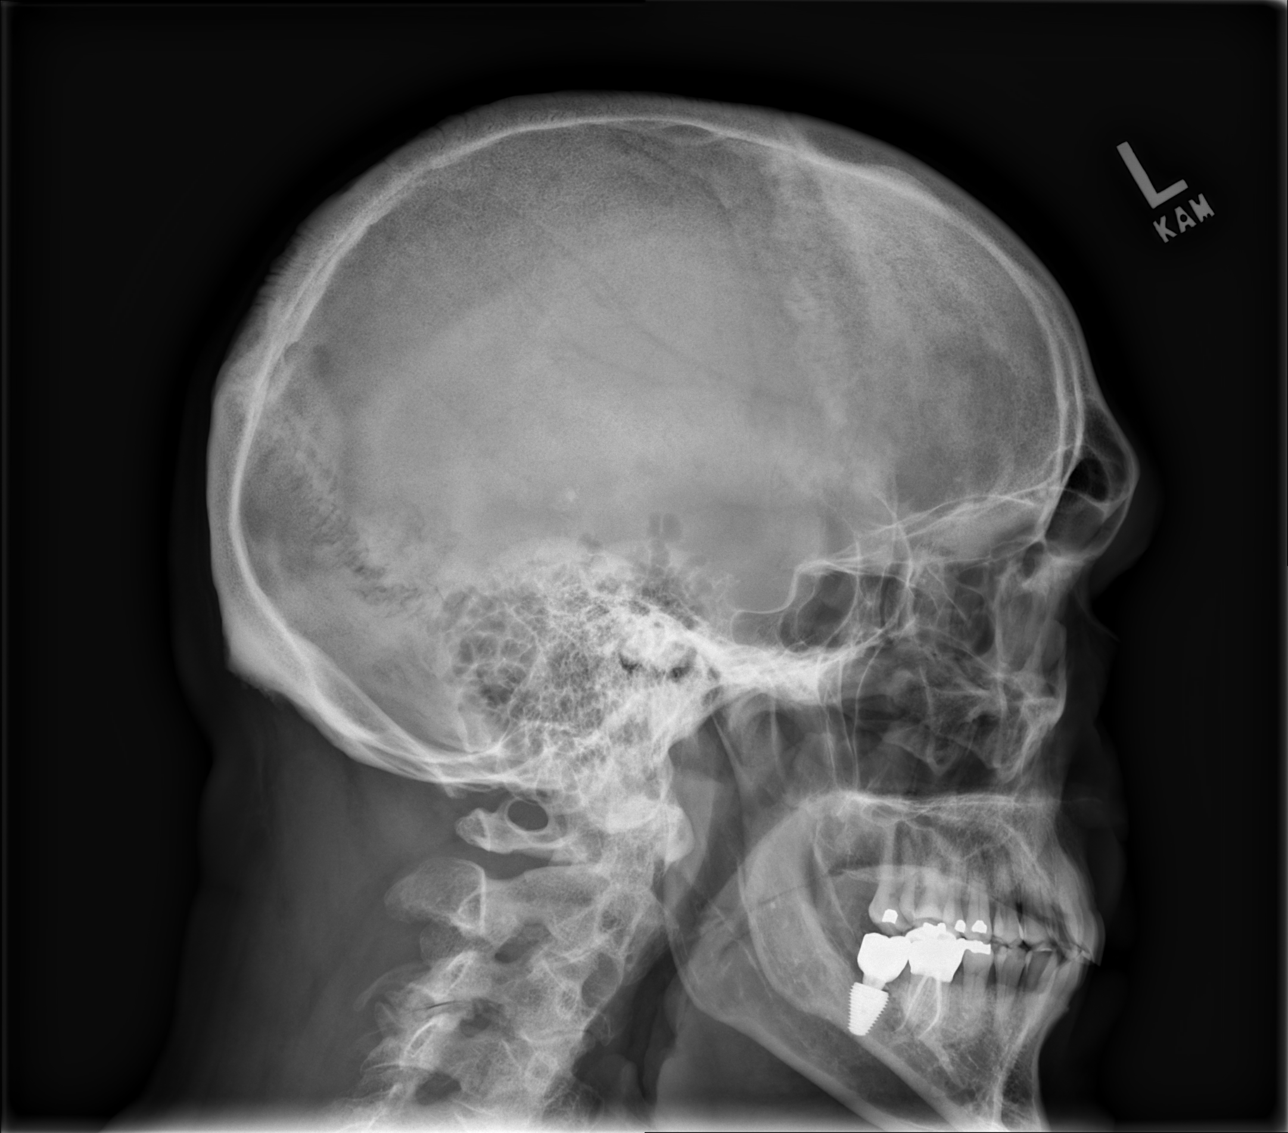

[w skull lat (2 of 2)]
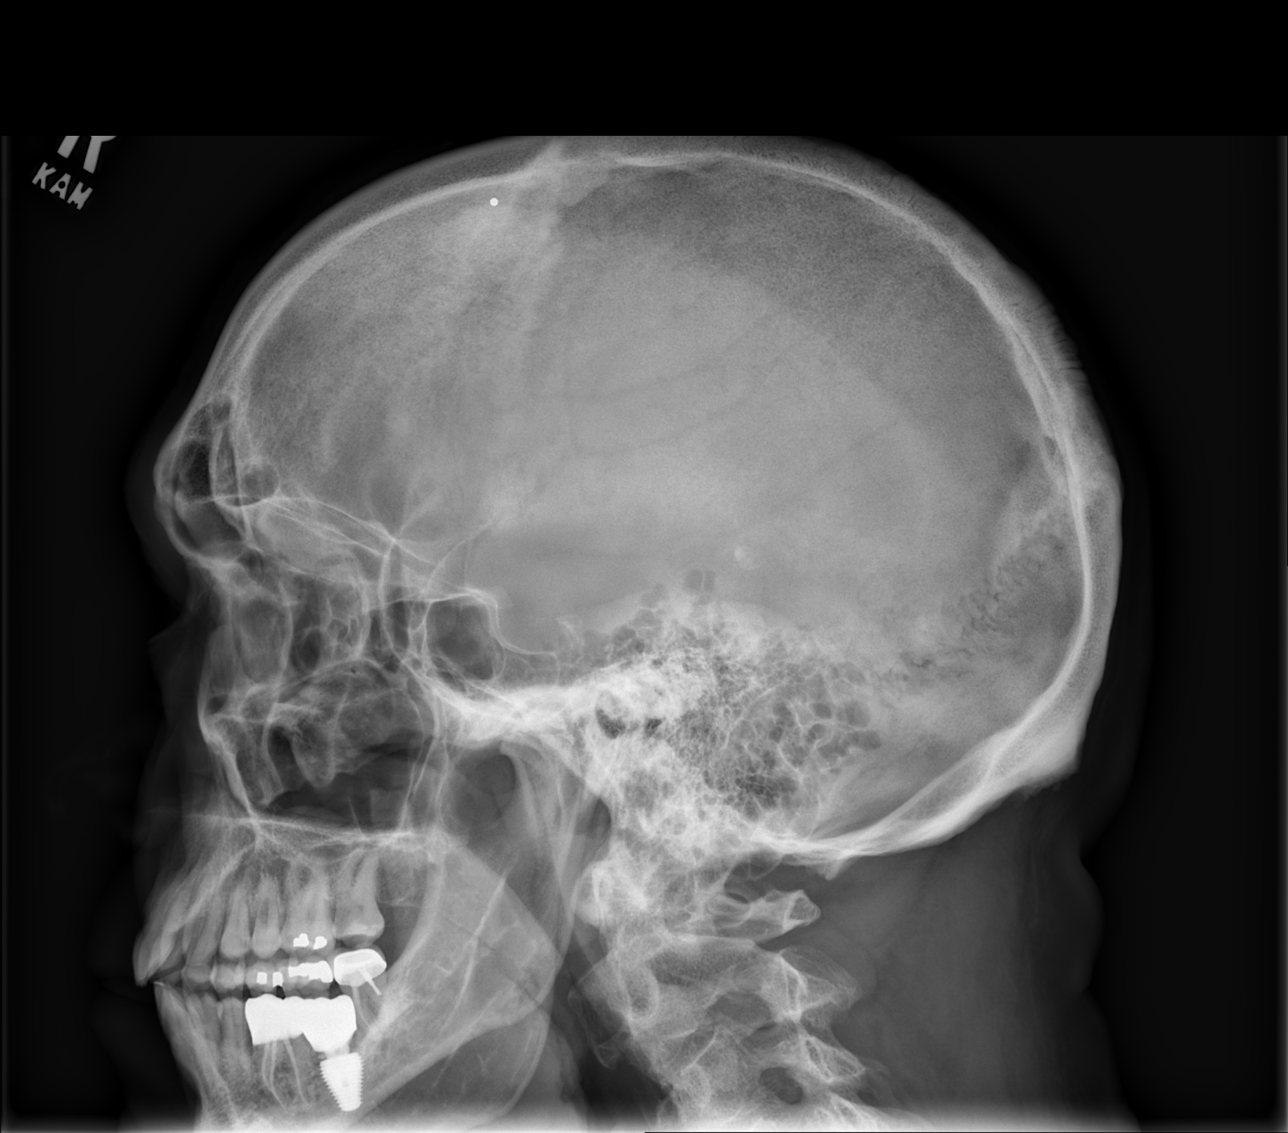

[[person_name]]
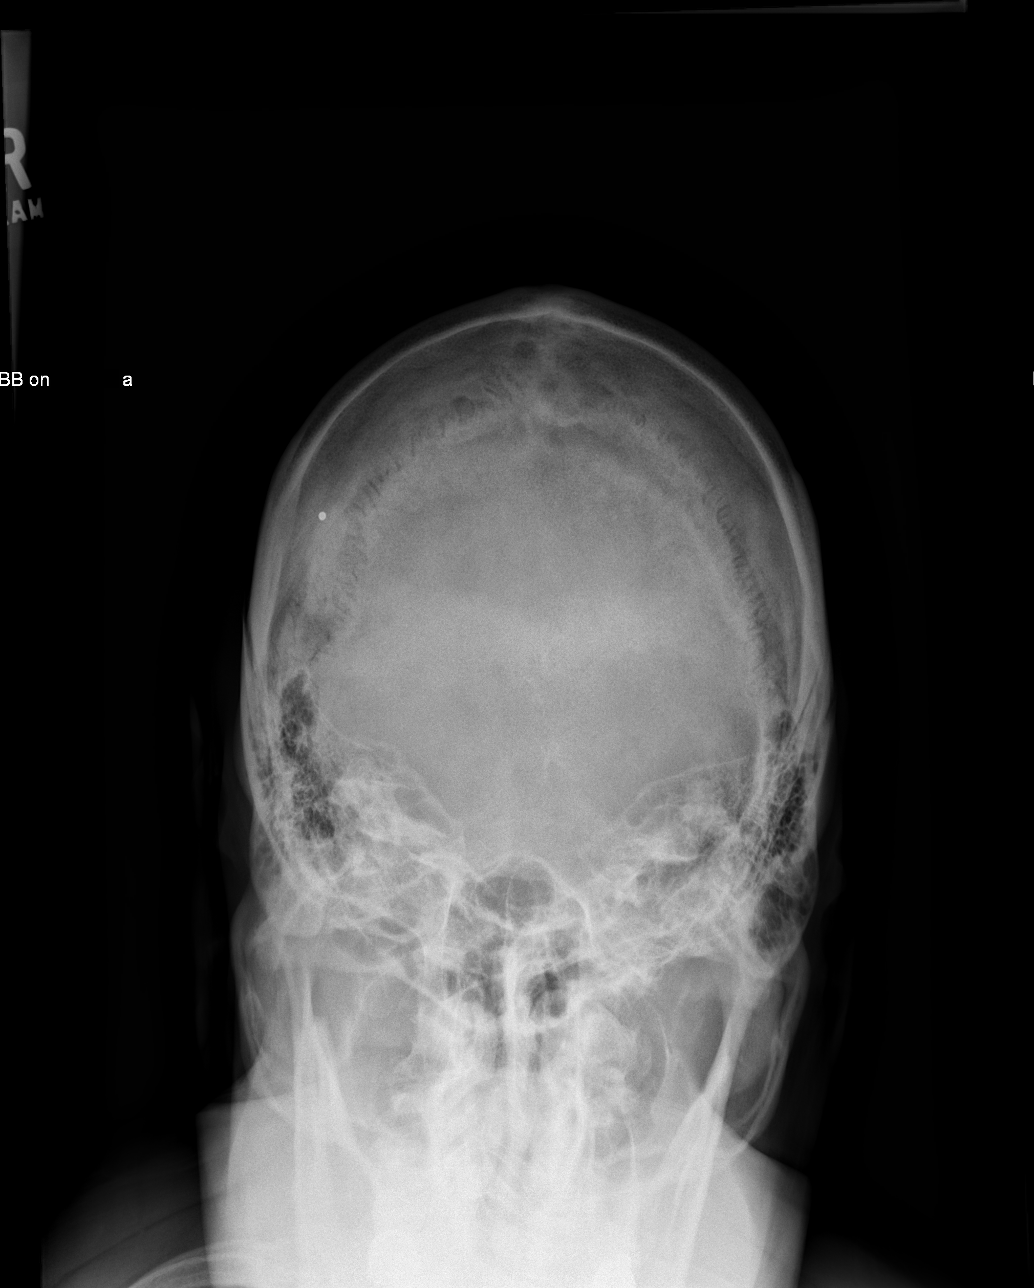

[4 of 4 positions shown; findings below may reference images not displayed]

FINDINGS: There is no evidence of skull fracture or other focal bone lesions.
No associated soft tissue abnormality in the region of interest.
IMPRESSION: No acute fracture or malalignment. No radiographic soft tissue
abnormality in the marked right frontoparietal region of interest.

## 2023-02-06 DIAGNOSIS — E1122 Type 2 diabetes mellitus with diabetic chronic kidney disease: Secondary | ICD-10-CM | POA: Diagnosis not present

## 2023-02-06 DIAGNOSIS — E785 Hyperlipidemia, unspecified: Secondary | ICD-10-CM | POA: Diagnosis not present

## 2023-02-06 DIAGNOSIS — I2510 Atherosclerotic heart disease of native coronary artery without angina pectoris: Secondary | ICD-10-CM | POA: Diagnosis not present

## 2023-02-06 DIAGNOSIS — I130 Hypertensive heart and chronic kidney disease with heart failure and stage 1 through stage 4 chronic kidney disease, or unspecified chronic kidney disease: Secondary | ICD-10-CM | POA: Diagnosis not present

## 2023-02-23 ENCOUNTER — Telehealth: Payer: Self-pay | Admitting: Hematology and Oncology

## 2023-02-23 NOTE — Telephone Encounter (Signed)
Patient called to move appointment up.

## 2023-02-25 ENCOUNTER — Inpatient Hospital Stay: Payer: BC Managed Care – PPO | Attending: Hematology and Oncology

## 2023-02-25 ENCOUNTER — Inpatient Hospital Stay: Payer: BC Managed Care – PPO

## 2023-02-25 DIAGNOSIS — D751 Secondary polycythemia: Secondary | ICD-10-CM | POA: Diagnosis not present

## 2023-02-25 LAB — CBC WITH DIFFERENTIAL/PLATELET
Abs Immature Granulocytes: 0.06 10*3/uL (ref 0.00–0.07)
Basophils Absolute: 0.1 10*3/uL (ref 0.0–0.1)
Basophils Relative: 1 %
Eosinophils Absolute: 0.2 10*3/uL (ref 0.0–0.5)
Eosinophils Relative: 2 %
HCT: 50.7 % (ref 39.0–52.0)
Hemoglobin: 17.1 g/dL — ABNORMAL HIGH (ref 13.0–17.0)
Immature Granulocytes: 1 %
Lymphocytes Relative: 31 %
Lymphs Abs: 3.3 10*3/uL (ref 0.7–4.0)
MCH: 30.9 pg (ref 26.0–34.0)
MCHC: 33.7 g/dL (ref 30.0–36.0)
MCV: 91.5 fL (ref 80.0–100.0)
Monocytes Absolute: 1 10*3/uL (ref 0.1–1.0)
Monocytes Relative: 10 %
Neutro Abs: 5.9 10*3/uL (ref 1.7–7.7)
Neutrophils Relative %: 55 %
Platelets: 208 10*3/uL (ref 150–400)
RBC: 5.54 MIL/uL (ref 4.22–5.81)
RDW: 13.6 % (ref 11.5–15.5)
WBC: 10.5 10*3/uL (ref 4.0–10.5)
nRBC: 0 % (ref 0.0–0.2)

## 2023-02-25 NOTE — Progress Notes (Signed)
Patient remained for 15 minute post observation. Tolerated treatment well, VSS, and ambulated to lobby, stable condition.

## 2023-03-06 ENCOUNTER — Inpatient Hospital Stay: Payer: BC Managed Care – PPO

## 2023-04-16 ENCOUNTER — Encounter: Payer: Self-pay | Admitting: Neurology

## 2023-05-19 NOTE — Telephone Encounter (Signed)
Updated BCBS authLaurena Slimmer- BCBS Berkley Harvey: [id number] (exp. 05/19/23 to 07/17/23)   Patient is scheduled at [facility] for 07/14/23 at 8 pm.

## 2023-05-19 NOTE — Telephone Encounter (Signed)
Updated BCBS auth:  Split- BCBS auth: [id number] (exp. 05/19/23 to 07/17/23)   Patient is scheduled at [facility] for 07/14/23 at 8 pm.  

## 2023-06-22 DIAGNOSIS — E1122 Type 2 diabetes mellitus with diabetic chronic kidney disease: Secondary | ICD-10-CM | POA: Diagnosis not present

## 2023-07-10 ENCOUNTER — Inpatient Hospital Stay: Payer: BC Managed Care – PPO

## 2023-07-10 ENCOUNTER — Inpatient Hospital Stay: Payer: BC Managed Care – PPO | Attending: Hematology and Oncology | Admitting: Hematology and Oncology

## 2023-07-10 ENCOUNTER — Encounter: Payer: Self-pay | Admitting: Hematology and Oncology

## 2023-07-10 ENCOUNTER — Other Ambulatory Visit: Payer: Self-pay

## 2023-07-10 VITALS — BP 107/78 | HR 96 | Temp 98.7°F | Resp 18 | Ht 73.0 in | Wt 222.0 lb

## 2023-07-10 DIAGNOSIS — Z720 Tobacco use: Secondary | ICD-10-CM | POA: Diagnosis not present

## 2023-07-10 DIAGNOSIS — F17210 Nicotine dependence, cigarettes, uncomplicated: Secondary | ICD-10-CM | POA: Diagnosis not present

## 2023-07-10 DIAGNOSIS — R0902 Hypoxemia: Secondary | ICD-10-CM | POA: Insufficient documentation

## 2023-07-10 DIAGNOSIS — D751 Secondary polycythemia: Secondary | ICD-10-CM | POA: Diagnosis not present

## 2023-07-10 DIAGNOSIS — Z79899 Other long term (current) drug therapy: Secondary | ICD-10-CM | POA: Insufficient documentation

## 2023-07-10 LAB — CBC WITH DIFFERENTIAL/PLATELET
Abs Immature Granulocytes: 0.03 10*3/uL (ref 0.00–0.07)
Basophils Absolute: 0.1 10*3/uL (ref 0.0–0.1)
Basophils Relative: 1 %
Eosinophils Absolute: 0.2 10*3/uL (ref 0.0–0.5)
Eosinophils Relative: 2 %
HCT: 49.1 % (ref 39.0–52.0)
Hemoglobin: 15.9 g/dL (ref 13.0–17.0)
Immature Granulocytes: 0 %
Lymphocytes Relative: 30 %
Lymphs Abs: 3.3 10*3/uL (ref 0.7–4.0)
MCH: 28.5 pg (ref 26.0–34.0)
MCHC: 32.4 g/dL (ref 30.0–36.0)
MCV: 88.2 fL (ref 80.0–100.0)
Monocytes Absolute: 1 10*3/uL (ref 0.1–1.0)
Monocytes Relative: 10 %
Neutro Abs: 6.2 10*3/uL (ref 1.7–7.7)
Neutrophils Relative %: 57 %
Platelets: 204 10*3/uL (ref 150–400)
RBC: 5.57 MIL/uL (ref 4.22–5.81)
RDW: 14.9 % (ref 11.5–15.5)
WBC: 10.8 10*3/uL — ABNORMAL HIGH (ref 4.0–10.5)
nRBC: 0 % (ref 0.0–0.2)

## 2023-07-10 NOTE — Progress Notes (Signed)
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  He is still not ready to quit   No orders of the defined types were placed in this encounter.   The total time spent in the appointment was 20 minutes encounter with patients including review of chart and various tests results, discussions about plan of care and coordination of care plan   All questions were answered. The patient knows to call the clinic with any problems, questions or concerns. No barriers to learning was detected.    Artis Delay, MD 8/16/202412:20 PM  INTERVAL HISTORY: Chris Long 62 y.o. male returns for further follow-up for secondary erythrocytosis requiring phlebotomy Since last time I saw him, he was able to donate blood at [facility] without difficulties.  He has been donating blood every other month Unfortunately, he continues to smoke 10 to 15 cigarettes/day  SUMMARY OF HEMATOLOGIC HISTORY:  This is a patient seen by another hematologist in December 2013 for severe erythrocytosis with associated leukocytosis. Peripheral blood for JAK 2 mutation was negative. The patient was advised to stop smoking. He also underwent sleep study and was told he had mild obstructive sleep apnea but declined treatment for now. Since 2015 he had multiple phlebotomy sessions. CT lung screening in April 2017 was negative He has 1 unit of blood removed every time hemoglobin is greater than 15  I have reviewed the past medical history, past surgical history, social history and  family history with the patient and they are unchanged from previous note.  ALLERGIES:  has No Known Allergies.  MEDICATIONS:  Current Outpatient Medications  Medication Sig Dispense Refill   Apple Cider Vinegar 188 MG CAPS Take 2 capsules by mouth daily at 6 (six) AM.     aspirin EC 81 MG tablet Take 1 tablet (81 mg total) by mouth daily. Swallow whole. 30 tablet 12   Black Pepper-Turmeric (TURMERIC CURCUMIN) 03-999 MG CAPS Take 2 Capfuls by mouth daily at 6 (six) AM.     Calcium Polycarbophil (FIBER-CAPS PO) Take by mouth.     CINNAMON PO Take 2 capsules by mouth daily.     co-enzyme Q-10 30 MG capsule Take 1 capsule by mouth daily.     dapagliflozin propanediol (FARXIGA) 10 MG TABS tablet      ezetimibe (ZETIA) 10 MG tablet Take by mouth.     levothyroxine (SYNTHROID, LEVOTHROID) 88 MCG tablet Take 112 mcg by mouth daily.     losartan (COZAAR) 25 MG tablet Take 12.5 mg by mouth daily.     metFORMIN (GLUCOPHAGE-XR) 500 MG 24 hr tablet Take 500 mg by mouth daily.  6   metoprolol (LOPRESSOR) 50 MG tablet Take 0.5 tablets (25 mg total) by mouth 2 (two) times daily. 90 tablet 3   OVER THE COUNTER MEDICATION Take 1 capsule by mouth daily. [place] Tail Mushrooms     OZEMPIC, 0.25 OR 0.5 MG/DOSE, 2 MG/3ML SOPN Inject 1 mg into the skin once a week.     Rosuvastatin Calcium 40 MG CPSP Take 40 mg  by mouth daily.     No current facility-administered medications for this visit.     REVIEW OF SYSTEMS:   Constitutional: Denies fevers, chills or night sweats Eyes: Denies blurriness of vision Ears, nose, mouth, throat, and face: Denies mucositis or sore throat Respiratory: Denies cough, dyspnea or wheezes Cardiovascular: Denies palpitation, chest discomfort or lower extremity swelling Gastrointestinal:  Denies nausea, heartburn or change in bowel habits Skin: Denies abnormal skin rashes Lymphatics: Denies new lymphadenopathy or easy bruising Neurological:Denies numbness, tingling or new  weaknesses Behavioral/Psych: Mood is stable, no new changes  All other systems were reviewed with the patient and are negative.  PHYSICAL EXAMINATION: ECOG PERFORMANCE STATUS: 0 - Asymptomatic  Vitals:   07/10/23 1212  BP: 107/78  Pulse: 96  Resp: 18  Temp: 98.7 F (37.1 C)  SpO2: 97%   Filed Weights   07/10/23 1212  Weight: 222 lb (100.7 kg)    GENERAL:alert, no distress and comfortable, he looks plethoric NEURO: alert & oriented x 3 with fluent speech, no focal motor/sensory deficits  LABORATORY DATA:  I have reviewed the data as listed     Component Value Date/Time   NA 139 09/13/2014 1315   K 4.7 09/13/2014 1315   CL 102 09/13/2014 1315   CO2 23 09/13/2014 1315   GLUCOSE 114 (H) 09/13/2014 1315   BUN 15 09/13/2014 1315   CREATININE 0.90 09/13/2014 1315   CALCIUM 9.7 09/13/2014 1315   PROT 7.2 09/13/2014 1315   ALBUMIN 3.9 09/13/2014 1315   AST 21 09/13/2014 1315   ALT 32 09/13/2014 1315   ALKPHOS 60 09/13/2014 1315   BILITOT 0.3 09/13/2014 1315   GFRNONAA >90 09/13/2014 1315   GFRAA >90 09/13/2014 1315    No results found for: "SPEP", "UPEP"  Lab Results  Component Value Date   WBC 10.8 (H) 07/10/2023   NEUTROABS 6.2 07/10/2023   HGB 15.9 07/10/2023   HCT 49.1 07/10/2023   MCV 88.2 07/10/2023   PLT 204 07/10/2023      Chemistry      Component Value Date/Time   NA 139 09/13/2014 1315   K 4.7 09/13/2014 1315   CL 102 09/13/2014 1315   CO2 23 09/13/2014 1315   BUN 15 09/13/2014 1315   CREATININE 0.90 09/13/2014 1315      Component Value Date/Time   CALCIUM 9.7 09/13/2014 1315   ALKPHOS 60 09/13/2014 1315   AST 21 09/13/2014 1315   ALT 32 09/13/2014 1315   BILITOT 0.3 09/13/2014 1315

## 2023-07-10 NOTE — Assessment & Plan Note (Signed)
I encouraged the patient to quit smoking.  He is still not ready to quit

## 2023-07-10 NOTE — Assessment & Plan Note (Signed)
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

## 2023-07-14 ENCOUNTER — Ambulatory Visit: Payer: BC Managed Care – PPO | Admitting: Neurology

## 2023-07-14 DIAGNOSIS — G4736 Sleep related hypoventilation in conditions classified elsewhere: Secondary | ICD-10-CM

## 2023-07-14 DIAGNOSIS — G4733 Obstructive sleep apnea (adult) (pediatric): Secondary | ICD-10-CM | POA: Diagnosis not present

## 2023-07-14 DIAGNOSIS — D751 Secondary polycythemia: Secondary | ICD-10-CM

## 2023-07-24 NOTE — Procedures (Signed)
[facility] POLYSOMNOGRAPHY  INTERPRETATION REPORT   STUDY DATE:  07/14/2023     PATIENT NAME:  Chris Long         DATE OF BIRTH:  Mar 01, 1961  PATIENT ID:  [id number]    TYPE OF STUDY:  SPLIT  READING PHYSICIAN: Melvyn Novas, MD REFERRED BY: Dr Timothy Lasso, MD  SCORING TECHNICIAN: Domingo Cocking, RPSGT   HISTORY:  Chris Long is a 62 year old male patient, referred by Dr Timothy Lasso based on a concern of sleep hypoxia. This concern was raised after he was found to have high red blood cell numbers, hemoglobin, and hematocrit elevation. He is a smoker.  Has ASD, CAD, persistent cough, HTN, Hypothyroidism, Prediabetes. Family history of Lynch syndrome- he had genetic testing.   ADDITIONAL INFORMATION:  The Epworth Sleepiness Scale endorsed at 12 /24 points (scores above or equal to 10 are suggestive of hypersomnolence). FSS endorsed at   /63 points.  Height: 73 in Weight: 223 lbs (BMI 29) Neck Size: 17 in  MEDICATIONS: Aspirin, apple cider vinegar, Turmeric Curcumin, Fiber caps, Co-Q10, Farxiga, Zetia  TECHNICAL DESCRIPTION: A registered sleep technologist ( RPSGT)  was in attendance for the duration of the recording.  Data collection, scoring, video monitoring, and reporting were performed in compliance with the AASM Manual for the Scoring of Sleep and Associated Events; (Hypopnea is scored based on the criteria listed in Section VIII D. 1b in the AASM Manual V2.6 using a 4% oxygen desaturation rule or Hypopnea is scored based on the criteria listed in Section VIII D. 1a in the AASM Manual V2.6 using 3% oxygen desaturation and /or arousal rule).   SLEEP CONTINUITY AND SLEEP ARCHITECTURE:  Lights-out was at 20:44: and lights-on at  03:06:, with  6.4 hours of recording time . Total sleep time ( TST) was 274.0 minutes with a decreased sleep efficiency at 71.6%.  Sleep latency was 34.5 minutes.  REM sleep latency was 97.5 minutes. Of the total sleep time, the  percentage of stage N1 sleep was 15.3%, stage N2 sleep was 61%, stage N3 sleep was 0.0%, and REM sleep was 23.9%. Wake after sleep onset (WASO) time accounted for 74 minutes.  There were 3 Stage R periods observed on this study night, 28 awakenings (i.e. transitions to Stage W from any sleep stage), and 99 total stage transitions  BODY POSITION:  TST was divided between the following sleep positions: supine 52 minutes (19%), non-supine 222 minutes (81%); right 37 minutes (14%), left 184 minutes (67%), and prone 00 minutes (0%).   RESPIRATORY MONITORING:  Based on AASM criteria (using a 3% oxygen desaturation and /or arousal rule for scoring hypopneas), there were 0 apneas (0 obstructive; 0 central; 0 mixed), and 6 hypopneas. Apnea index was 0.0. Hypopnea index was 1.3. The apnea-hypopnea index was 1.3 overall (1.1 supine, 5 non-supine; 4.6 REM, 0.0 supine REM).  There were 0 respiratory effort-related arousals (RERAs).   OXIMETRY: Oxyhemoglobin Saturation Nadir during sleep was at  85% with a mean of 92%.  Of the Total sleep time (TST), hypoxemia (<89%) was present for 37.9 minutes, or 13.8% of total sleep time.  There were 0 occurrences of Cheyne Stokes breathing.   EEG:  PSG EEG was of normal amplitude and frequency, with symmetric manifestation of sleep stages. EKG: NSR. The average heart rate during sleep was 81 bpm.  The heart rate during sleep varied between a minimum of 75 and  a maximum of  90 bpm. LIMB MOVEMENTS: There  were 0 periodic limb movements of sleep (0.0/h), of which 0 (0.0/h) were associated with an arousal. AROUSAL: There were 51 arousals in total, for an arousal index of 11 arousals/hour.  Of these, 3 were identified as respiratory-related arousals (1 /h), 0 were PLM-related arousals (0 /h), and 53 were non-specific arousals (12 /h).  IMPRESSION: Sleep disordered breathing was not present, the AHI after 2 hours of sleep was 1/h. Sleep hypoxemia was noted and severe enough to  justify supplementation of oxygen at 1 L/m starting at 22.03 hours. The oximeter had displayed 86% saturation until then for several minutes.  The oxygen saturation rose to a new nadir at 91 %.   Total sleep time was reduced at 274.0 minutes- the study ended at 3 AM.  Sleep efficiency was decreased at 71.6%. Snoring was noted while in supine position.   RECOMMENDATIONS: This patient suffers from sleep hypoxia independent of sleep apnea.  Supplementing oxygen increased sleep efficiency and allowed REM sleep rebounding.    Based on this sleep study, I cannot state the cause of hypoxia (I suspect pulmonary smoking related disease and ASD) but can rule out that apnea is a factor.  I want for this patient to receive a prescription of 1 l/m of oxygen per nasal canula and will need to refer to pulmonology for further work up.   Melvyn Novas, MD          General Information  Name: Chris Long, Chris Long BMI: 13.08 Physician: Melvyn Novas, MD  ID: [id number] Height: 73.0 in Technician: Domingo Cocking, RPSGT  Sex: Male Weight: 223.0 lb Record: [id number]  Age: 33 [[birth year]] Date: 07/14/2023    Medical & Medication History    Chris Long is a 62 y.o. Caucasian male patient seen here as a referral on 12/22/2022 from Dr Timothy Lasso for a sleep hypoxemia concerns . Chief concern according to patient : " I still smoke and I have high red blood cells" I have the pleasure of seeing Chris Long today, a right-handed White or Caucasian male with a possible sleep disorder who has a past medical history of ASD (atrial septal defect), Coronary artery disease, Cough, persistent (02/22/2016), Family history of Lynch syndrome, Genetic testing (04/22/2018), Hyperlipidemia, Hypertension, Hypothyroidism, Obesity, Polycythemia, Prediabetes, Status post patch closure of ASD (02/09/2014), and Tobacco abuse.  Aspirin, apple cider vinegar, Turmeric Curcumin, Fiber caps, Co-Q10, Farxiga, Zetia   Sleep  Disorder      Comments   Patient arrived for a SPLIT (at 10 AHI) polysomnogram. Procedure explained and all questions answered. Patient shown CPAP at 5cm with a large Eson 2 nasal mask, a S/M AirFit F40 full face mask and a medium Vitera full face mask in preparation for SPLIT night protocol. Standard paste setup without complications. Patient slept left, right, and supine. Oxygen was started at 1/LPM at 22:03. Occasional mild snoring was heard. Mild respiratory events observed, primarily while supine. After two hours total sleep time, AHI = 1. No obvious cardiac arrhythmias observed. Patient has a known cardiac history. No significant PLMS observed. One restroom visit.      Lights out: 08:44:12 PM Lights on: 03:06:16 AM   Time Total Supine Side Prone Upright  Recording (TRT) 6h 22.19m 1h 44.35m 4h 38.65m 0h 0.39m 0h 0.9m  Sleep (TST) 4h 34.57m 0h 52.36m 3h 41.26m 0h 0.1m 0h 0.52m   Latency N1 N2 N3 REM Onset Per. Slp. Eff.  Actual 0h 34.69m 0h 38.72m 0h 0.76m 1h 37.48m 0h 34.75m 0h 43.78m 71.63%  Stg Dur Wake N1 N2 N3 REM  Total 59.5 42.0 166.5 0.0 65.5  Supine 17.5 13.0 39.5 0.0 0.0  Side 42.0 29.0 127.0 0.0 65.5  Prone 0.0 0.0 0.0 0.0 0.0  Upright 0.0 0.0 0.0 0.0 0.0   Stg % Wake N1 N2 N3 REM  Total 17.8 15.3 60.8 0.0 23.9  Supine 5.2 4.7 14.4 0.0 0.0  Side 12.6 10.6 46.4 0.0 23.9  Prone 0.0 0.0 0.0 0.0 0.0  Upright 0.0 0.0 0.0 0.0 0.0     Apnea Summary Sub Supine Side Prone Upright  Total 0 Total 0 0 0 0 0    REM 0 0 0 0 0    NREM 0 0 0 0 0  Obs 0 REM 0 0 0 0 0    NREM 0 0 0 0 0  Mix 0 REM 0 0 0 0 0    NREM 0 0 0 0 0  Cen 0 REM 0 0 0 0 0    NREM 0 0 0 0 0   Rera Summary Sub Supine Side Prone Upright  Total 0 Total 0 0 0 0 0    REM 0 0 0 0 0    NREM 0 0 0 0 0   Hypopnea Summary Sub Supine Side Prone Upright  Total 6 Total 6 1 5  0 0    REM 5 0 5 0 0    NREM 1 1 0 0 0   4% Hypopnea Summary Sub Supine Side Prone Upright  Total (4%) 1 Total 1 0 1 0 0    REM 1 0 1 0 0    NREM 0  0 0 0 0     AHI Total Obs Mix Cen  1.31 Apnea 0.00 0.00 0.00 0.00   Hypopnea 1.31 -- -- --  0.22 Hypopnea (4%) 0.22 -- -- --   3% AASM Total Supine Side Prone Upright  Position AHI 1.31 1.14 1.35 0.00 0.00  REM AHI 4.58   NREM AHI 0.29   Position RDI 1.31 1.14 1.35 0.00 0.00  REM RDI 4.58   NREM RDI 0.29    4% CMS Total Supine Side Prone Upright  Position AHI (4%) 0.22 0.00 0.27 0.00 0.00  REM AHI (4%) 0.92   NREM AHI (4%) 0.00   Position RDI (4%) 0.22 0.00 0.27 0.00 0.00  REM RDI (4%) 0.92   NREM RDI (4%) 0.00    Desaturation Information Threshold: 2% <100% <90% <80% <70% <60% <50% <40%  Supine 30.0 5.0 0.0 0.0 0.0 0.0 0.0  Side 62.0 4.0 0.0 0.0 0.0 0.0 0.0  Prone 0.0 0.0 0.0 0.0 0.0 0.0 0.0  Upright 0.0 0.0 0.0 0.0 0.0 0.0 0.0  Total 92.0 9.0 0.0 0.0 0.0 0.0 0.0  Index 16.6 1.6 0.0 0.0 0.0 0.0 0.0   Threshold: 3% <100% <90% <80% <70% <60% <50% <40%  Supine 8.0 2.0 0.0 0.0 0.0 0.0 0.0  Side 16.0 4.0 0.0 0.0 0.0 0.0 0.0  Prone 0.0 0.0 0.0 0.0 0.0 0.0 0.0  Upright 0.0 0.0 0.0 0.0 0.0 0.0 0.0  Total 24.0 6.0 0.0 0.0 0.0 0.0 0.0  Index 4.3 1.1 0.0 0.0 0.0 0.0 0.0   Threshold: 4% <100% <90% <80% <70% <60% <50% <40%  Supine 5.0 2.0 0.0 0.0 0.0 0.0 0.0  Side 6.0 2.0 0.0 0.0 0.0 0.0 0.0  Prone 0.0 0.0 0.0 0.0 0.0 0.0 0.0  Upright 0.0 0.0 0.0 0.0 0.0 0.0 0.0  Total 11.0 4.0 0.0  0.0 0.0 0.0 0.0  Index 2.0 0.7 0.0 0.0 0.0 0.0 0.0   Threshold: 4% <100% <90% <80% <70% <60% <50% <40%  Supine 5 2 0 0 0 0 0  Side 6 2 0 0 0 0 0  Prone 0 0 0 0 0 0 0  Upright 0 0 0 0 0 0 0  Total 11 4 0 0 0 0 0   Awakening/Arousal Information # of Awakenings 28  Wake after sleep onset 74.66m  Wake after persistent sleep 72.9m   Arousal Assoc. Arousals Index  Apneas 0 0.0  Hypopneas 3 0.7  Leg Movements 13 2.8  Snore 0 0.0  PTT Arousals 0 0.0  Spontaneous 53 11.6  Total 69 15.1  Leg Movement Information PLMS LMs Index  Total LMs during PLMS 0 0.0  LMs w/ Microarousals 0 0.0   LM  LMs Index  w/ Microarousal 13 2.8  w/ Awakening 5 1.1  w/ Resp Event 0 0.0  Spontaneous 6 1.3  Total 19 4.2     Desaturation threshold setting: 4% Minimum desaturation setting: 10 seconds SaO2 nadir: 85% . before oxygen was given at 1 L/minute.  The longest event was a 25 sec obstructive Hypopnea with a minimum SaO2 of 90%. The lowest SaO2 was 90% associated with a 12 sec obstructive Hypopnea. EKG ;NSR  EKG Avg Max Min  Awake 82 99 76  Asleep 81 90 75

## 2023-07-30 ENCOUNTER — Telehealth: Payer: Self-pay

## 2023-07-30 NOTE — Telephone Encounter (Signed)
I spoke with the patient and provided the results of the split night study. He verbalized understanding of the findings and expressed appreciation for the call.   He has requested that his results be sent to his Cardiologist, Sunit Victorino Sparrow, DO at [facility].

## 2023-07-30 NOTE — Telephone Encounter (Signed)
-----   Message from Victor Dohmeier sent at 07/24/2023  7:08 PM EDT ----- Dear Dr. Timothy Lasso, This sleep study was negative for sleep apnea, showed only mild snoring in supine and documented severe , prolonged sleep hypoxia. I can for certain rule out that sleep apnea is a cause of erythrocytosis.   1 liter 02 was given per nasal canula and improved the sleep fragmentation, allowed REM sleep to break through.  My dilemma: In a sleep clinic, we can't prescribe oxygen without treating an underlying cause - we ruled out sleep apnea. If we would have found apnea, we would not be giving 02 without apnea therapy( PAP).   The cause of hypoxia needs to be established. Therefore, we need to defer further work-up to you , pulmonology /cardiology .  Melvyn Novas, MD

## 2023-08-05 DIAGNOSIS — G4736 Sleep related hypoventilation in conditions classified elsewhere: Secondary | ICD-10-CM | POA: Diagnosis not present

## 2023-09-28 DIAGNOSIS — D3132 Benign neoplasm of left choroid: Secondary | ICD-10-CM | POA: Diagnosis not present

## 2023-09-28 DIAGNOSIS — E119 Type 2 diabetes mellitus without complications: Secondary | ICD-10-CM | POA: Diagnosis not present

## 2023-09-28 DIAGNOSIS — H524 Presbyopia: Secondary | ICD-10-CM | POA: Diagnosis not present

## 2023-09-28 DIAGNOSIS — Z961 Presence of intraocular lens: Secondary | ICD-10-CM | POA: Diagnosis not present

## 2023-10-02 DIAGNOSIS — Z1212 Encounter for screening for malignant neoplasm of rectum: Secondary | ICD-10-CM | POA: Diagnosis not present

## 2023-10-02 DIAGNOSIS — E1122 Type 2 diabetes mellitus with diabetic chronic kidney disease: Secondary | ICD-10-CM | POA: Diagnosis not present

## 2023-10-02 DIAGNOSIS — Z0000 Encounter for general adult medical examination without abnormal findings: Secondary | ICD-10-CM | POA: Diagnosis not present

## 2023-10-09 DIAGNOSIS — Z0000 Encounter for general adult medical examination without abnormal findings: Secondary | ICD-10-CM | POA: Diagnosis not present

## 2023-10-09 DIAGNOSIS — Z1389 Encounter for screening for other disorder: Secondary | ICD-10-CM | POA: Diagnosis not present

## 2023-10-09 DIAGNOSIS — Z1331 Encounter for screening for depression: Secondary | ICD-10-CM | POA: Diagnosis not present

## 2023-10-09 DIAGNOSIS — E1122 Type 2 diabetes mellitus with diabetic chronic kidney disease: Secondary | ICD-10-CM | POA: Diagnosis not present

## 2023-10-09 DIAGNOSIS — R82998 Other abnormal findings in urine: Secondary | ICD-10-CM | POA: Diagnosis not present

## 2023-11-05 ENCOUNTER — Ambulatory Visit: Payer: Self-pay | Admitting: Cardiology

## 2023-11-06 ENCOUNTER — Ambulatory Visit: Payer: BC Managed Care – PPO | Attending: Cardiology | Admitting: Cardiology

## 2023-11-06 ENCOUNTER — Encounter: Payer: Self-pay | Admitting: Cardiology

## 2023-11-06 VITALS — BP 110/72 | HR 89 | Resp 16 | Ht 73.0 in | Wt 215.8 lb

## 2023-11-06 DIAGNOSIS — E782 Mixed hyperlipidemia: Secondary | ICD-10-CM | POA: Diagnosis not present

## 2023-11-06 DIAGNOSIS — E119 Type 2 diabetes mellitus without complications: Secondary | ICD-10-CM

## 2023-11-06 DIAGNOSIS — F17210 Nicotine dependence, cigarettes, uncomplicated: Secondary | ICD-10-CM

## 2023-11-06 DIAGNOSIS — I2510 Atherosclerotic heart disease of native coronary artery without angina pectoris: Secondary | ICD-10-CM

## 2023-11-06 DIAGNOSIS — Z8774 Personal history of (corrected) congenital malformations of heart and circulatory system: Secondary | ICD-10-CM

## 2023-11-06 MED ORDER — FENOFIBRATE 145 MG PO TABS: 145.0000 mg | ORAL_TABLET | Freq: Every day | ORAL | 3 refills | Status: DC

## 2023-11-06 MED ORDER — METOPROLOL TARTRATE 25 MG PO TABS: 25.0000 mg | ORAL_TABLET | Freq: Two times a day (BID) | ORAL | 3 refills | Status: DC

## 2023-11-06 NOTE — Patient Instructions (Addendum)
Medication Instructions:  Your physician has recommended you make the following change in your medication:  STOP Zetia  START Fenofibrate 145 mg once daily   **Ordering a new prescription for Metoprolol Tartrate (Lopressor) for 25 mg tablets**   *If you need a refill on your cardiac medications before your next appointment, please call your pharmacy*  Lab Work: To be completed in 6 weeks: FASTING lipid panel, direct LDL, and CMP  If you have labs (blood work) drawn today and your tests are completely normal, you will receive your results only by: MyChart Message (if you have MyChart) OR A paper copy in the mail If you have any lab test that is abnormal or we need to change your treatment, we will call you to review the results.  Testing/Procedures: None ordered today.  Follow-Up: At [facility], you and your health needs are our priority.  As part of our continuing mission to provide you with exceptional heart care, we have created designated Provider Care Teams.  These Care Teams include your primary Cardiologist (physician) and Advanced Practice Providers (APPs -  Physician Assistants and Nurse Practitioners) who all work together to provide you with the care you need, when you need it.   Your next appointment:   1 year(s)  The format for your next appointment:   In Person  Provider:   Tessa Lerner, DO {

## 2023-11-06 NOTE — Progress Notes (Signed)
Cardiology Office Note:  .   Date:  11/06/2023  ID:  Chris Long, DOB 1961-01-11, MRN [id number] PCP:  [name], MD  Former Cardiology Providers: Dr. [place] [facility] Providers Cardiologist:  Tessa Lerner, DO , [facility] (established care 07/04/2022) Electrophysiologist:  None  Click to update primary MD,subspecialty MD or APP then REFRESH:1}    Chief Complaint  Patient presents with   Follow-up    1 year - hx of ASD repair and mild CAD    History of Present Illness: .   Chris Long is a 62 y.o.  male whose past medical history and cardiovascular risk factors includes: Non-insulin-dependent diabetes mellitus type 2, hypertension, hyperlipidemia, mild CAD per cath 2015, history of ASD repair 1972, cigarette smoking.   Referred to the office back in August 2023 given his history of ASD repair and mild CAD.  Patient underwent ASD repair in 1972 and noted to have mild CAD based on angiography back in 2015.  Since then clinically he has done well.  Since establishing care he has undergone ischemic workup and echocardiogram with bubble study was negative for PFO or ASD.  He presents today for 1 year follow-up visit.  Doing well over the last 1 year denies anginal chest pain or heart failure symptoms.  Overall physical endurance remained stable he still enjoys walking 2 miles at least 4-5 times a week.  Unfortunately he continues to smoke 0.5 packs/day.  Currently on Chantix and is motivated to stop.  He has tried nicotine patches in the past but as had skin rash.  He was also diagnosed with nocturnal hypoxia and currently on 2 L nasal cannula oxygen at bedtime.  Mother had a myocardial infarction at the age of 35 and also had a ASD.  Review of Systems: .   Review of Systems  Cardiovascular:  Negative for chest pain, claudication, irregular heartbeat, leg swelling, near-syncope, orthopnea, palpitations, paroxysmal nocturnal dyspnea and syncope.  Respiratory:  Negative  for shortness of breath.   Hematologic/Lymphatic: Negative for bleeding problem.    Studies Reviewed:   EKG: EKG Interpretation Date/Time:  Friday November 06 2023 08:33:38 EST Ventricular Rate:  89 PR Interval:  196 QRS Duration:  92 QT Interval:  368 QTC Calculation: 447 R Axis:   28  Text Interpretation: Normal sinus rhythm Normal ECG When compared with ECG of 13-Sep-2014 12:16, Premature atrial complexes NO LONGER PRESENT Confirmed by Tessa Lerner ([id number]) on 11/06/2023 8:45:35 AM  Echocardiogram: November 2023: LVEF 53%, grade 1 diastolic dysfunction, mild TR, bubble study negative for PFO.  See report for additional details  Stress Testing: Exercise treadmill stress test November 2023: Achieved 8.5 METS, 91% of APMHR, stress ECG no ischemic changes per report, lower study  RADIOLOGY: N/A  Risk Assessment/Calculations:   NA   Labs:    External Labs: Collected: 06/24/2022 provided by referring physician. Hemoglobin A1c 7.2   External Labs: Collected: 09/17/2021 provided by patient. Total cholesterol 172, triglycerides 221, HDL 37, LDL 91, non-HDL 135.   External Labs: Collected: 09/29/2022 provided by the patient BUN 19, creatinine 1. eGFR 76 mL/min. Sodium 139, potassium 4.6, chloride 104, bicarb 25 AST 22, ALT 31, alkaline phosphatase 56. TSH 3.79 A1c 6.8 Total cholesterol 97, triglycerides 180, HDL 34, LDL 27, non-HDL 63  External Labs: Collected: November 2024 available in Care Everywhere. A1c 7.2 Hemoglobin 15.2 Total cholesterol 101, triglycerides 223, HDL 34, LDL calculated 32 BUN 24, creatinine 1. Sodium 137, potassium 4.8, chloride 101, bicarb 22, AST,  ALT, alkaline phosphatase within normal limits.  Physical Exam:    Today's Vitals   11/06/23 0829  BP: 110/72  Pulse: 89  Resp: 16  SpO2: 97%  Weight: 215 lb 12.8 oz (97.9 kg)  Height: 6\' 1"  (1.854 m)   Body mass index is 28.47 kg/m. Wt Readings from Last 3 Encounters:  11/06/23 215 lb  12.8 oz (97.9 kg)  07/10/23 222 lb (100.7 kg)  12/22/22 223 lb 12.8 oz (101.5 kg)    Physical Exam  Constitutional: No distress.  hemodynamically stable  Neck: No JVD present.  Cardiovascular: Normal rate, regular rhythm, S1 normal and S2 normal. Exam reveals no gallop, no S3 and no S4.  No murmur heard. Pulmonary/Chest: Effort normal and breath sounds normal. No stridor. He has no wheezes. He has no rales.  Abdominal: Soft. Bowel sounds are normal. He exhibits no distension. There is no abdominal tenderness.  Musculoskeletal:        General: No edema.     Cervical back: Neck supple.  Neurological: He is alert and oriented to person, place, and time. He has intact cranial nerves (2-12).  Skin: Skin is warm.     Impression & Recommendation(s):  Impression:   ICD-10-CM   1. Coronary artery disease involving native coronary artery of native heart without angina pectoris  I25.10 EKG 12-Lead    2. H/O congenital atrial septal defect (ASD) repair  Z87.74     3. Non-insulin dependent type 2 diabetes mellitus (HCC)  E11.9     4. Mixed hyperlipidemia  E78.2 fenofibrate (TRICOR) 145 MG tablet    Comprehensive metabolic panel    Lipid panel    LDL cholesterol, direct    LDL cholesterol, direct    Lipid panel    Comprehensive metabolic panel    5. Cigarette smoker  F17.210        Recommendation(s):  Coronary artery disease involving native coronary artery of native heart without angina pectoris Noted to have mild CAD as per his prior angiogram in 2015. Denies anginal chest pain or heart failure symptoms. EKG today independently reviewed, sinus rhythm without ischemia. Prior GXT low risk. Prior echocardiogram reviewed as part of today's office visit noted preserved LVEF, and bubble study negative for PFO or ASD. Most recent lipid profile from Care Everywhere independently reviewed-LDL excellent control but triglycerides still remain above goal.  Patient states that this has been a  long-term issue for him.  I reviewed his lipids dating back to 2022 and his triglyceride levels have predominantly stayed above 150 mg/dL. Shared decision was to discontinue Zetia. Start fenofibrate. Recently started Chantix for smoking cessation. Reemphasized the importance of secondary prevention with focus on improving her modifiable cardiovascular risk factors such as glycemic control, lipid management, blood pressure control, weight loss.  H/O congenital atrial septal defect (ASD) repair Repair in 1972. Recent echo noted no flow across the repair as per the bubble study. Monitor for now  Non-insulin dependent type 2 diabetes mellitus (HCC) A1c acceptable but not at goal. Reemphasized importance of glycemic control  Mixed hyperlipidemia Currently on rosuvastatin 40 mg p.o. daily and Zetia 10 mg p.o. daily As discussed above we will discontinue Zetia 10 mg p.o. daily He denies myalgia or other side effects. Most recent lipids dated November 2024, independently reviewed as noted above.  Cigarette smoker Tobacco cessation counseling: Currently smoking 0.75 packs/day   Recently started on Chantix. In the past Wellbutrin has not worked and patches caused him to have a rash He is  informed of the dangers of tobacco abuse including stroke, cancer, and MI, as well as benefits of tobacco cessation. He is willing to quit at this time. 5 mins were spent counseling patient cessation techniques. We discussed various methods to help quit smoking, including deciding on a date to quit, joining a support group, pharmacological agents- nicotine gum/patch/lozenges.  I will reassess his progress at the next follow-up visit    Orders Placed:  Orders Placed This Encounter  Procedures   Comprehensive metabolic panel    Standing Status:   Future    Number of Occurrences:   1    Expected Date:   12/18/2023    Expiration Date:   11/05/2024   Lipid panel    Standing Status:   Future    Number of  Occurrences:   1    Expected Date:   12/18/2023    Expiration Date:   11/05/2024   LDL cholesterol, direct    Standing Status:   Future    Number of Occurrences:   1    Expected Date:   12/18/2023    Expiration Date:   11/05/2024   EKG 12-Lead    Final Medication List:    Meds ordered this encounter  Medications   fenofibrate (TRICOR) 145 MG tablet    Sig: Take 1 tablet (145 mg total) by mouth daily.    Dispense:  90 tablet    Refill:  3   metoprolol tartrate (LOPRESSOR) 25 MG tablet    Sig: Take 1 tablet (25 mg total) by mouth 2 (two) times daily.    Dispense:  180 tablet    Refill:  3    Adjusting where pt only has to take 1 tablet and not continue to cut his 50 mg tablets in half    Medications Discontinued During This Encounter  Medication Reason   ezetimibe (ZETIA) 10 MG tablet Discontinued by provider   metoprolol (LOPRESSOR) 50 MG tablet      Current Outpatient Medications:    Apple Cider Vinegar 188 MG CAPS, Take 2 capsules by mouth daily at 6 (six) AM., Disp: , Rfl:    aspirin EC 81 MG tablet, Take 1 tablet (81 mg total) by mouth daily. Swallow whole., Disp: 30 tablet, Rfl: 12   Black Pepper-Turmeric (TURMERIC CURCUMIN) 03-999 MG CAPS, Take 2 Capfuls by mouth daily at 6 (six) AM., Disp: , Rfl:    Calcium Polycarbophil (FIBER-CAPS PO), Take by mouth., Disp: , Rfl:    CINNAMON PO, Take 2 capsules by mouth daily., Disp: , Rfl:    co-enzyme Q-10 30 MG capsule, Take 1 capsule by mouth daily., Disp: , Rfl:    dapagliflozin propanediol (FARXIGA) 10 MG TABS tablet, , Disp: , Rfl:    fenofibrate (TRICOR) 145 MG tablet, Take 1 tablet (145 mg total) by mouth daily., Disp: 90 tablet, Rfl: 3   levothyroxine (SYNTHROID, LEVOTHROID) 88 MCG tablet, Take 112 mcg by mouth daily., Disp: , Rfl:    losartan (COZAAR) 25 MG tablet, Take 12.5 mg by mouth daily., Disp: , Rfl:    metFORMIN (GLUCOPHAGE-XR) 500 MG 24 hr tablet, Take 500 mg by mouth daily., Disp: , Rfl: 6   OVER THE COUNTER  MEDICATION, Take 1 capsule by mouth daily. [place] Tail Mushrooms, Disp: , Rfl:    OZEMPIC, 0.25 OR 0.5 MG/DOSE, 2 MG/3ML SOPN, Inject 1 mg into the skin once a week., Disp: , Rfl:    Rosuvastatin Calcium 40 MG CPSP, Take 40 mg by mouth  daily., Disp: , Rfl:    metoprolol tartrate (LOPRESSOR) 25 MG tablet, Take 1 tablet (25 mg total) by mouth 2 (two) times daily., Disp: 180 tablet, Rfl: 3  Consent:   NA  Disposition:   1 year sooner if needed Patient may be asked to follow-up sooner based on the results of the above-mentioned testing.  His questions and concerns were addressed to his satisfaction. He voices understanding of the recommendations provided during this encounter.    Signed, Tessa Lerner, DO, [facility]  [facility]  [address] [place], [place] [address] 11/06/2023 11:44 AM

## 2023-11-13 ENCOUNTER — Other Ambulatory Visit (HOSPITAL_COMMUNITY): Payer: Self-pay

## 2023-11-16 ENCOUNTER — Other Ambulatory Visit: Payer: Self-pay | Admitting: Cardiology

## 2023-11-16 DIAGNOSIS — E782 Mixed hyperlipidemia: Secondary | ICD-10-CM

## 2023-11-17 NOTE — Telephone Encounter (Signed)
Pt of [facility]. His insurance isn't covering this dosage. Can [facility] please send in a new RX with covered dosage? Please advise.

## 2023-11-19 DIAGNOSIS — F17210 Nicotine dependence, cigarettes, uncomplicated: Secondary | ICD-10-CM | POA: Diagnosis not present

## 2023-11-21 DIAGNOSIS — R051 Acute cough: Secondary | ICD-10-CM | POA: Diagnosis not present

## 2023-11-21 DIAGNOSIS — R52 Pain, unspecified: Secondary | ICD-10-CM | POA: Diagnosis not present

## 2023-11-21 DIAGNOSIS — B9689 Other specified bacterial agents as the cause of diseases classified elsewhere: Secondary | ICD-10-CM | POA: Diagnosis not present

## 2023-11-21 DIAGNOSIS — J208 Acute bronchitis due to other specified organisms: Secondary | ICD-10-CM | POA: Diagnosis not present

## 2023-11-23 ENCOUNTER — Other Ambulatory Visit: Payer: Self-pay

## 2023-11-23 DIAGNOSIS — E782 Mixed hyperlipidemia: Secondary | ICD-10-CM

## 2023-11-23 MED ORDER — FENOFIBRATE 134 MG PO CAPS: 134.0000 mg | ORAL_CAPSULE | Freq: Every day | ORAL | 3 refills | Status: DC

## 2023-11-23 NOTE — Progress Notes (Signed)
New dosing for Fenofibrate ordered (134 mg).

## 2023-11-23 NOTE — Telephone Encounter (Signed)
Yes - dose of 134mg  is fine.   Chris Turrubiates [place], DO, [facility]

## 2023-12-28 DIAGNOSIS — R3989 Other symptoms and signs involving the genitourinary system: Secondary | ICD-10-CM | POA: Diagnosis not present

## 2023-12-30 DIAGNOSIS — E782 Mixed hyperlipidemia: Secondary | ICD-10-CM | POA: Diagnosis not present

## 2023-12-31 LAB — COMPREHENSIVE METABOLIC PANEL
ALT: 12 [IU]/L (ref 0–44)
AST: 17 [IU]/L (ref 0–40)
Albumin: 4.5 g/dL (ref 3.9–4.9)
Alkaline Phosphatase: 60 [IU]/L (ref 44–121)
BUN/Creatinine Ratio: 18 (ref 10–24)
BUN: 19 mg/dL (ref 8–27)
Bilirubin Total: 0.2 mg/dL (ref 0.0–1.2)
CO2: 19 mmol/L — ABNORMAL LOW (ref 20–29)
Calcium: 9.7 mg/dL (ref 8.6–10.2)
Chloride: 103 mmol/L (ref 96–106)
Creatinine, Ser: 1.04 mg/dL (ref 0.76–1.27)
Globulin, Total: 2.5 g/dL (ref 1.5–4.5)
Glucose: 151 mg/dL — ABNORMAL HIGH (ref 70–99)
Potassium: 4.4 mmol/L (ref 3.5–5.2)
Sodium: 142 mmol/L (ref 134–144)
Total Protein: 7 g/dL (ref 6.0–8.5)
eGFR: 81 mL/min/{1.73_m2} (ref >59)

## 2023-12-31 LAB — LIPID PANEL
Chol/HDL Ratio: 4 {ratio} (ref 0.0–5.0)
Cholesterol, Total: 148 mg/dL (ref 100–199)
HDL: 37 mg/dL — ABNORMAL LOW (ref >39)
LDL Chol Calc (NIH): 65 mg/dL (ref 0–99)
Triglycerides: 286 mg/dL — ABNORMAL HIGH (ref 0–149)
VLDL Cholesterol Cal: 46 mg/dL — ABNORMAL HIGH (ref 5–40)

## 2023-12-31 LAB — LDL CHOLESTEROL, DIRECT: LDL Direct: 74 mg/dL (ref 0–99)

## 2024-01-04 DIAGNOSIS — R3129 Other microscopic hematuria: Secondary | ICD-10-CM | POA: Diagnosis not present

## 2024-01-06 ENCOUNTER — Other Ambulatory Visit: Payer: Self-pay

## 2024-01-06 DIAGNOSIS — E782 Mixed hyperlipidemia: Secondary | ICD-10-CM

## 2024-01-28 DIAGNOSIS — D1803 Hemangioma of intra-abdominal structures: Secondary | ICD-10-CM | POA: Diagnosis not present

## 2024-01-28 DIAGNOSIS — N400 Enlarged prostate without lower urinary tract symptoms: Secondary | ICD-10-CM | POA: Diagnosis not present

## 2024-01-28 DIAGNOSIS — R3129 Other microscopic hematuria: Secondary | ICD-10-CM | POA: Diagnosis not present

## 2024-01-28 DIAGNOSIS — K4090 Unilateral inguinal hernia, without obstruction or gangrene, not specified as recurrent: Secondary | ICD-10-CM | POA: Diagnosis not present

## 2024-01-28 DIAGNOSIS — K5750 Diverticulosis of both small and large intestine without perforation or abscess without bleeding: Secondary | ICD-10-CM | POA: Diagnosis not present

## 2024-02-05 DIAGNOSIS — E1122 Type 2 diabetes mellitus with diabetic chronic kidney disease: Secondary | ICD-10-CM | POA: Diagnosis not present

## 2024-02-10 DIAGNOSIS — R3129 Other microscopic hematuria: Secondary | ICD-10-CM | POA: Diagnosis not present

## 2024-02-11 ENCOUNTER — Other Ambulatory Visit: Payer: Self-pay | Admitting: Urology

## 2024-03-01 ENCOUNTER — Telehealth: Payer: Self-pay | Admitting: Pharmacist

## 2024-03-01 ENCOUNTER — Encounter: Payer: Self-pay | Admitting: Pharmacist

## 2024-03-01 ENCOUNTER — Ambulatory Visit: Payer: BC Managed Care – PPO | Attending: Cardiology | Admitting: Pharmacist

## 2024-03-01 ENCOUNTER — Telehealth: Payer: Self-pay

## 2024-03-01 ENCOUNTER — Other Ambulatory Visit (HOSPITAL_COMMUNITY): Payer: Self-pay

## 2024-03-01 DIAGNOSIS — E7849 Other hyperlipidemia: Secondary | ICD-10-CM | POA: Diagnosis not present

## 2024-03-01 DIAGNOSIS — E785 Hyperlipidemia, unspecified: Secondary | ICD-10-CM | POA: Insufficient documentation

## 2024-03-01 MED ORDER — OZEMPIC (2 MG/DOSE) 8 MG/3ML ~~LOC~~ SOPN: 2.0000 mg | PEN_INJECTOR | SUBCUTANEOUS | 0 refills | Status: DC

## 2024-03-01 MED ORDER — ICOSAPENT ETHYL 1 G PO CAPS
2.0000 g | ORAL_CAPSULE | Freq: Two times a day (BID) | ORAL | 11 refills | Status: DC
  Filled 2024-04-25: qty 120, 30d supply, fill #0
  Filled 2024-05-16 – 2024-05-18 (×2): qty 120, 30d supply, fill #1
  Filled 2024-06-19: qty 120, 30d supply, fill #2
  Filled 2024-07-19: qty 120, 30d supply, fill #3
  Filled 2024-08-18: qty 120, 30d supply, fill #4
  Filled 2024-09-25: qty 120, 30d supply, fill #5
  Filled 2024-11-10: qty 120, 30d supply, fill #6

## 2024-03-01 NOTE — Patient Instructions (Signed)
 Your Results:             Your most recent labs Goal  Total Cholesterol 148 < 200  Triglycerides 286 < 150  HDL (happy/good cholesterol) 37 > 40  LDL (lousy/bad cholesterol 74 < 70   Medication changes: continue taking fenofibrate 145 mg daily and Crestor 40 mg daily  We will start the process to get Vascepa covered by your insurance.  Once the prior authorization is complete, we will call you to let you know and confirm pharmacy information.      Lab orders: We want to repeat labs after 2-3 months.  We will send you a lab order to remind you once we get closer to that time.

## 2024-03-01 NOTE — Telephone Encounter (Signed)
 Pharmacy Patient Advocate Encounter   Received notification from Physician's Office that prior authorization for VASCEPA is required/requested.   Insurance verification completed.   The patient is insured through [facility] .   Per test claim: PA required; PA submitted to above mentioned insurance via CoverMyMeds Key/confirmation #/EOC [id number] Status is pending

## 2024-03-01 NOTE — Assessment & Plan Note (Addendum)
 Patient TG was elevated, fenofibrate was added to high intensity statins Dec 2024, and Zetia was d/c'd f/u lab in feb TG went up patient reported being compliant to fenofibrate.  November 2024 available in Care Everywhere-A1c 7.2 Total cholesterol 101, triglycerides 223, HDL 34, LDL calculated 32,Feb lipid lab TG went up 286 mg/dl direct LDL 74 and TC 295, HDL 37.We discussed TG goal (<150 mg/dl)  and how lifestyle affects TG and educated pt that it is recommended 1st step to lower TG. Dicussed fibrates and statins role in lowering TG level and how Vascepa will fit in the regimen to lower TG further. Recently Ozempic dose was increased to 1 mg once week PCP asked him to wait for further dose titration. However patient is ready for dose titration. Will send prescription for 2 mg dose so will start taking 2 mg dose in 3 weeks which will help to improve  BG and TG   Vascepa PA is approved and patient informed about he approval  Follow fasting lipid lab lab due in 2-3 months

## 2024-03-01 NOTE — Telephone Encounter (Signed)
 PA request has been Submitted. New Encounter has been or will be created for follow up. For additional info see Pharmacy Prior Auth telephone encounter from 03/01/24.

## 2024-03-01 NOTE — Progress Notes (Signed)
 Patient ID: Chris Long                 DOB: 1961-10-22                    MRN: [id number]      HPI: Chris Long is a 63 y.o. male patient referred to lipid clinic by Dr.Tolia. PMH is significant for CAD per cath 2015, T2DM, tobacco abuse, polycythemia vera, history of ASD repair 1972   Patient TG was elevated, fenofibrate was added to high intensity statins Dec 2024, and Zetia was d/c'd f/u lab in feb TG went up patient reported being compliant to fenofibrate.  November 2024 available in Care Everywhere. A1c 7.2 Total cholesterol 101, triglycerides 223, HDL 34, LDL calculated 32  Feb lipid lab TG went up 286 mg/dl direct LDL 74 and TC 409, HDL 37 Patient presented today for lipid clinic. Follows healthy diet and takes metformin and Farxiga for BG management. He exercise regularly. We discussed TG goal and how lifestyle affects TG and educated pt that it is recommended 1st step to lower TG. Dicussed fibrates and statins role in lowering TG level and how Vascepa will fit in the regimen to lower TG further. Recently Ozempic dose was increased to 1 mg once week PCP asked him to wait for further dose titration. However patient is ready for dose titration. Will send prescription for 2 mg dose so will start taking 2 mg dose in 3 weeks which will help to improve  BG and TG    Current Medications: fenofibrate 145 mg daily and Crestor 40 mg daily  Intolerances: none Risk Factors: CAD, HLD,  family hx of premature CAD ( mother MI at age 40)  LDL goal: <55 mg TG <150 mg   Diet: portion control, eat   Exercise: walks 2 miles 5 days a week Bike ride several miles 4-5 days a week   Family History:  Relation Problem Comments  Mother (Deceased) Heart attack X2  Heart failure     Father (Deceased) COPD   Hypertension   Lung cancer deceased 78; smoker    Sister Metallurgist)   Sister (Alive)   Maternal Uncle Prostate cancer (Age: 39) deceased 57    Paternal Uncle (Deceased) Cancer (Age:  74) GI cancer; deceased 69    Paternal Uncle (Deceased) Stomach cancer deceased 7    Cousin Breast cancer daughter of a unaffected maternal aunt; deceased 68    Other Breast cancer mother's paternal half-sister      Social History:  Alcohol:rarely 2 drinks every 2 months  Smoking: 1/2 pack per day   Labs:  Lipid Panel     Component Value Date/Time   CHOL 148 12/30/2023 1112   TRIG 286 (H) 12/30/2023 1112   HDL 37 (L) 12/30/2023 1112   CHOLHDL 4.0 12/30/2023 1112   CHOLHDL 5.9 08/28/2009 0355   VLDL 47 (H) 08/28/2009 0355   LDLCALC 65 12/30/2023 1112   LDLDIRECT 74 12/30/2023 1112   LABVLDL 46 (H) 12/30/2023 1112    Past Medical History:  Diagnosis Date   ASD (atrial septal defect)    with repair in 1972   Coronary artery disease    Mild CAD per cath in 2010   Cough, persistent 02/22/2016   Family history of Lynch syndrome    maternal relatives with a mutation in PMS2 gene   Genetic testing 04/22/2018   Multi-Cancer panel (83 genes) @ [facility] - No pathogenic mutations detected  Hyperlipidemia    Hypertension    Hypothyroidism    Obesity    Polycythemia    Prediabetes    Status post patch closure of ASD 02/09/2014   Tobacco abuse     Current Outpatient Medications on File Prior to Visit  Medication Sig Dispense Refill   Apple Cider Vinegar 188 MG CAPS Take 2 capsules by mouth daily at 6 (six) AM.     aspirin EC 81 MG tablet Take 1 tablet (81 mg total) by mouth daily. Swallow whole. 30 tablet 12   Black Pepper-Turmeric (TURMERIC CURCUMIN) 03-999 MG CAPS Take 2 Capfuls by mouth daily at 6 (six) AM.     Calcium Polycarbophil (FIBER-CAPS PO) Take by mouth.     CINNAMON PO Take 2 capsules by mouth daily.     co-enzyme Q-10 30 MG capsule Take 1 capsule by mouth daily.     dapagliflozin propanediol (FARXIGA) 10 MG TABS tablet      fenofibrate micronized (LOFIBRA) 134 MG capsule Take 1 capsule (134 mg total) by mouth daily before breakfast. 90 capsule 3    levothyroxine (SYNTHROID, LEVOTHROID) 88 MCG tablet Take 112 mcg by mouth daily.     losartan (COZAAR) 25 MG tablet Take 12.5 mg by mouth daily.     metFORMIN (GLUCOPHAGE-XR) 500 MG 24 hr tablet Take 500 mg by mouth daily.  6   metoprolol tartrate (LOPRESSOR) 25 MG tablet Take 1 tablet (25 mg total) by mouth 2 (two) times daily. 180 tablet 3   OVER THE COUNTER MEDICATION Take 1 capsule by mouth daily. [place] Tail Mushrooms     Rosuvastatin Calcium 40 MG CPSP Take 40 mg by mouth daily.     No current facility-administered medications on file prior to visit.    No Known Allergies  Assessment/Plan:  1. Hyperlipidemia -  Problem  Hyperlipidemia   Current Medications: fenofibrate 145 mg daily and Crestor 40 mg daily  Intolerances: none Risk Factors: CAD, HLD,  family hx of premature CAD ( mother MI at age 59)  LDL goal: <55 mg TG <150 mg     Hyperlipidemia Patient TG was elevated, fenofibrate was added to high intensity statins Dec 2024, and Zetia was d/c'd f/u lab in feb TG went up patient reported being compliant to fenofibrate.  November 2024 available in Care Everywhere-A1c 7.2 Total cholesterol 101, triglycerides 223, HDL 34, LDL calculated 32,Feb lipid lab TG went up 286 mg/dl direct LDL 74 and TC 409, HDL 37.We discussed TG goal (<150 mg/dl)  and how lifestyle affects TG and educated pt that it is recommended 1st step to lower TG. Dicussed fibrates and statins role in lowering TG level and how Vascepa will fit in the regimen to lower TG further. Recently Ozempic dose was increased to 1 mg once week PCP asked him to wait for further dose titration. However patient is ready for dose titration. Will send prescription for 2 mg dose so will start taking 2 mg dose in 3 weeks which will help to improve  BG and TG   Vascepa PA is approved and patient informed about he approval  Follow fasting lipid lab lab due in 2-3 months     Thank you,  Carmela Hurt, Pharm.D [facility]  Division of [facility] [address], [place], [place] [address]  Phone: [phone]; Fax: [phone]

## 2024-03-01 NOTE — Telephone Encounter (Signed)
 Pharmacy Patient Advocate Encounter  Received notification from [facility] that Prior Authorization for VASCEPA has been APPROVED from 03/01/24 to 03/01/25

## 2024-03-04 NOTE — Telephone Encounter (Signed)
 Patient made aware see office visit notes from 04/08 for more detail.

## 2024-03-16 ENCOUNTER — Encounter: Payer: Self-pay | Admitting: Cardiology

## 2024-03-16 MED ORDER — OZEMPIC (2 MG/DOSE) 8 MG/3ML ~~LOC~~ SOPN: 2.0000 mg | PEN_INJECTOR | SUBCUTANEOUS | 0 refills | Status: DC

## 2024-03-18 NOTE — Progress Notes (Addendum)
   PCP - Margarete Sharps, MD Cardiologist - Albert Huff, Eden Goodpasture, DO LOV 11-06-23  PPM/ICD -  Device Orders -  Rep Notified -   Chest x-ray - CT Chest 11-10-22 epic EKG - 11-06-23 epic Stress Test - 2023 epic ECHO -w/ bubble study  11-02-22 epic Cardiac Cath -   Sleep Study -  CPAP -   Fasting Blood Sugar -  Checks Blood Sugar _____ times a day  Blood Thinner Instructions: 81 mg asa stopped 4-29 Aspirin  Instructions:  ERAS Protcol - PRE-SURGERY  Ozempic  last dose - 03-21-24 FAXIGA- Last dose 03-25-24  COVID vaccine -yes  Activity--Able to climb a flight of stairs with no CP or SOB Anesthesia review: CAD, pre Dm, HTN, Polycythemia, Hypoxia at hs wears  o2 / 2 L at hs  Patient denies shortness of breath, fever, cough and chest pain at PAT appointment   All instructions explained to the patient, with a verbal understanding of the material. Patient agrees to go over the instructions while at home for a better understanding. Patient also instructed to self quarantine after being tested for COVID-19. The opportunity to ask questions was provided.

## 2024-03-18 NOTE — Patient Instructions (Addendum)
 SURGICAL WAITING ROOM VISITATION  Patients having surgery or a procedure may have no more than 2 support people in the waiting area - these visitors may rotate.    Children under the age of 23 must have an adult with them who is not the patient.  Due to an increase in RSV and influenza rates and associated hospitalizations, children ages 81 and under may not visit patients in [facility] hospitals.  Visitors with respiratory illnesses are discouraged from visiting and should remain at home.  If the patient needs to stay at the hospital during part of their recovery, the visitor guidelines for inpatient rooms apply. Pre-op nurse will coordinate an appropriate time for 1 support person to accompany patient in pre-op.  This support person may not rotate.    Please refer to the [facility] website for the visitor guidelines for Inpatients (after your surgery is over and you are in a regular room).       Your procedure is scheduled on: 03-29-24   Report to [facility] Main Entrance    Report to admitting at        0700  AM   Call this number if you have problems the morning of surgery [phone]   Do not eat food  OR DRINK LIQUIDS :After Midnight.            If you have questions, please contact your surgeon's office.   FOLLOW  ANY ADDITIONAL PRE OP INSTRUCTIONS YOU RECEIVED FROM YOUR SURGEON'S OFFICE!!!     Oral Hygiene is also important to reduce your risk of infection.                                    Remember - BRUSH YOUR TEETH THE MORNING OF SURGERY WITH YOUR REGULAR TOOTHPASTE  DENTURES WILL BE REMOVED PRIOR TO SURGERY PLEASE DO NOT APPLY "Poly grip" OR ADHESIVES!!!   Do NOT smoke after Midnight   Stop all vitamins and herbal supplements 7 days before surgery.   Take these medicines the morning of surgery with A SIP OF WATER: Rosuvastatin, metoprolol , levothyroxine  How to Manage Your Diabetes Before and After Surgery  Why is it important to control my blood  sugar before and after surgery? Improving blood sugar levels before and after surgery helps healing and can limit problems. A way of improving blood sugar control is eating a healthy diet by:  Eating less sugar and carbohydrates  Increasing activity/exercise  Talking with your doctor about reaching your blood sugar goals High blood sugars (greater than 180 mg/dL) can raise your risk of infections and slow your recovery, so you will need to focus on controlling your diabetes during the weeks before surgery. Make sure that the doctor who takes care of your diabetes knows about your planned surgery including the date and location.  How do I manage my blood sugar before surgery? Check your blood sugar at least 4 times a day, starting 2 days before surgery, to make sure that the level is not too high or low. Check your blood sugar the morning of your surgery when you wake up and every 2 hours until you get to the Short Stay unit. If your blood sugar is less than 70 mg/dL, you will need to treat for low blood sugar: Do not take insulin. Treat a low blood sugar (less than 70 mg/dL) with  cup of clear juice (cranberry or  apple), 4 glucose tablets, OR glucose gel. Recheck blood sugar in 15 minutes after treatment (to make sure it is greater than 70 mg/dL). If your blood sugar is not greater than 70 mg/dL on recheck, call [phone] for further instructions. Report your blood sugar to the short stay nurse when you get to Short Stay.  If you are admitted to the hospital after surgery: Your blood sugar will be checked by the staff and you will probably be given insulin after surgery (instead of oral diabetes medicines) to make sure you have good blood sugar levels. The goal for blood sugar control after surgery is 80-180 mg/dL.   WHAT DO I DO ABOUT MY DIABETES MEDICATION?  Do not take oral diabetes medicines (pills) the morning of surgery.  DO NOT TAKE THE FOLLOWING 7 DAYS PRIOR TO SURGERY:  Ozempic ,        HOLD FARXIGA 72 HOURS PRIOR TO  SURGERY   LAST DOSE 03-25-24      DO NOT TAKE ANY ORAL DIABETIC MEDICATIONS DAY OF YOUR SURGERY  Bring CPAP mask and tubing day of surgery.                              You may not have any metal on your body including hair pins, jewelry, and body piercing                Men may shave face and neck.   Do not bring valuables to the hospital. [facility] IS NOT             RESPONSIBLE   FOR VALUABLES.   Contacts, glasses, dentures or bridgework may not be worn into surgery.   Bring small overnight bag day of surgery.   DO NOT BRING YOUR HOME MEDICATIONS TO THE HOSPITAL. PHARMACY WILL DISPENSE MEDICATIONS LISTED ON YOUR MEDICATION LIST TO YOU DURING YOUR ADMISSION IN THE HOSPITAL!    Patients discharged on the day of surgery will not be allowed to drive home.  Someone NEEDS to stay with you for the first 24 hours after anesthesia.   Special Instructions: Bring a copy of your healthcare power of attorney and living will documents the day of surgery if you haven't scanned them before.              Please read over the following fact sheets you were given: IF YOU HAVE QUESTIONS ABOUT YOUR PRE-OP INSTRUCTIONS PLEASE CALL [phone]    If you test positive for Covid or have been in contact with anyone that has tested positive in the last 10 days please notify you surgeon.     - Preparing for Surgery Before surgery, you can play an important role.  Because skin is not sterile, your skin needs to be as free of germs as possible.  You can reduce the number of germs on your skin by washing with CHG (chlorahexidine gluconate) soap before surgery.  CHG is an antiseptic cleaner which kills germs and bonds with the skin to continue killing germs even after washing. Please DO NOT use if you have an allergy to CHG or antibacterial soaps.  If your skin becomes reddened/irritated stop using the CHG and inform your nurse when you arrive at  Short Stay. Do not shave (including legs and underarms) for at least 48 hours prior to the first CHG shower.  You may shave your face/neck. Please follow these instructions carefully:  1.  Shower with  CHG Soap the night before surgery and the  morning of Surgery.  2.  If you choose to wash your hair, wash your hair first as usual with your  normal  shampoo.  3.  After you shampoo, rinse your hair and body thoroughly to remove the  shampoo.                           4.  Use CHG as you would any other liquid soap.  You can apply chg directly  to the skin and wash                       Gently with a scrungie or clean washcloth.  5.  Apply the CHG Soap to your body ONLY FROM THE NECK DOWN.   Do not use on face/ open                           Wound or open sores. Avoid contact with eyes, ears mouth and genitals (private parts).                       Wash face,  Genitals (private parts) with your normal soap.             6.  Wash thoroughly, paying special attention to the area where your surgery  will be performed.  7.  Thoroughly rinse your body with warm water from the neck down.  8.  DO NOT shower/wash with your normal soap after using and rinsing off  the CHG Soap.                9.  Pat yourself dry with a clean towel.            10.  Wear clean pajamas.            11.  Place clean sheets on your bed the night of your first shower and do not  sleep with pets. Day of Surgery : Do not apply any lotions/deodorants the morning of surgery.  Please wear clean clothes to the hospital/surgery center.  FAILURE TO FOLLOW THESE INSTRUCTIONS MAY RESULT IN THE CANCELLATION OF YOUR SURGERY PATIENT SIGNATURE_________________________________  NURSE SIGNATURE__________________________________  ________________________________________________________________________

## 2024-03-25 ENCOUNTER — Other Ambulatory Visit: Payer: Self-pay

## 2024-03-25 ENCOUNTER — Encounter (HOSPITAL_COMMUNITY): Payer: Self-pay

## 2024-03-25 ENCOUNTER — Encounter (HOSPITAL_COMMUNITY)
Admission: RE | Admit: 2024-03-25 | Discharge: 2024-03-25 | Disposition: A | Discharge status: Home / Self Care | Source: Ambulatory Visit | Attending: Urology | Admitting: Urology

## 2024-03-25 VITALS — BP 118/69 | HR 83 | Temp 98.1°F | Resp 16 | Ht 73.0 in | Wt 213.0 lb

## 2024-03-25 DIAGNOSIS — E119 Type 2 diabetes mellitus without complications: Secondary | ICD-10-CM | POA: Insufficient documentation

## 2024-03-25 DIAGNOSIS — Z01812 Encounter for preprocedural laboratory examination: Secondary | ICD-10-CM | POA: Insufficient documentation

## 2024-03-25 DIAGNOSIS — I10 Essential (primary) hypertension: Secondary | ICD-10-CM

## 2024-03-25 HISTORY — DX: Type 2 diabetes mellitus without complications: E11.9

## 2024-03-25 HISTORY — DX: Chronic obstructive pulmonary disease, unspecified: J44.9

## 2024-03-25 HISTORY — DX: Hypoxemia: R09.02

## 2024-03-25 HISTORY — DX: Ventricular premature depolarization: I49.3

## 2024-03-25 HISTORY — DX: Unspecified osteoarthritis, unspecified site: M19.90

## 2024-03-25 HISTORY — DX: Personal history of other diseases of the digestive system: Z87.19

## 2024-03-25 HISTORY — DX: Pneumonia, unspecified organism: J18.9

## 2024-03-25 LAB — BASIC METABOLIC PANEL WITH GFR
Anion gap: 10 (ref 5–15)
BUN: 23 mg/dL (ref 8–23)
CO2: 23 mmol/L (ref 22–32)
Calcium: 9.4 mg/dL (ref 8.9–10.3)
Chloride: 103 mmol/L (ref 98–111)
Creatinine, Ser: 1.2 mg/dL (ref 0.61–1.24)
GFR, Estimated: >60 mL/min (ref >60)
Glucose, Bld: 113 mg/dL — ABNORMAL HIGH (ref 70–99)
Potassium: 3.9 mmol/L (ref 3.5–5.1)
Sodium: 136 mmol/L (ref 135–145)

## 2024-03-25 LAB — HEMOGLOBIN A1C
Hgb A1c MFr Bld: 7.1 % — ABNORMAL HIGH (ref 4.8–5.6)
Mean Plasma Glucose: 157.07 mg/dL

## 2024-03-25 LAB — CBC
HCT: 48 % (ref 39.0–52.0)
Hemoglobin: 14.4 g/dL (ref 13.0–17.0)
MCH: 26.3 pg (ref 26.0–34.0)
MCHC: 30 g/dL (ref 30.0–36.0)
MCV: 87.8 fL (ref 80.0–100.0)
Platelets: 247 10*3/uL (ref 150–400)
RBC: 5.47 MIL/uL (ref 4.22–5.81)
RDW: 16.4 % — ABNORMAL HIGH (ref 11.5–15.5)
WBC: 9.7 10*3/uL (ref 4.0–10.5)
nRBC: 0 % (ref 0.0–0.2)

## 2024-03-25 LAB — GLUCOSE, CAPILLARY: Glucose-Capillary: 126 mg/dL — ABNORMAL HIGH (ref 70–99)

## 2024-03-28 ENCOUNTER — Encounter (HOSPITAL_COMMUNITY): Payer: Self-pay

## 2024-03-28 NOTE — Progress Notes (Signed)
 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 PMH of every day smoking, HTN, CAD, ASD s/p repair (1972), COPD, nocturnal hypoxia (uses 2L at bedtime), hiatal hernia, T2DM, hypothyroidism, arthritis.  Patient follows with Cardiology for hx of mild CAD (by cath in 2010), and hx of ASD repair in 1972. Last seen on 11/06/23. He has undergone ischemic workup and echocardiogram with bubble study in 2023 which was negative for PFO or ASD. 1 year follow up planned.  Patient had a sleep study by Neurology in 06/2023. Determined he does not have sleep apnea but needs supplemental O2 for nocturnal hypoxia.  Last seen by PCP on 02/05/24. BP controlled. A1c 7.1. Uses inhalers for COPD.   Ozempic  last dose - 03-21-24 FAXIGA- Last dose 03-25-24  VS: BP 118/69   Pulse 83   Temp 36.7 C (Oral)   Resp 16   Ht 6\' 1"  (1.854 m)   Wt 96.6 kg   SpO2 98%   BMI 28.10 kg/m   PROVIDERS: Margarete Sharps, MD   LABS: Labs reviewed: Acceptable for surgery. (all labs ordered are listed, but only abnormal results are displayed)  Labs Reviewed  GLUCOSE, CAPILLARY - Abnormal; Notable for the following components:      Result Value   Glucose-Capillary 126 (*)    All other components within normal limits  HEMOGLOBIN A1C - Abnormal; Notable for the following components:   Hgb A1c MFr Bld 7.1 (*)    All other components within normal limits  BASIC METABOLIC PANEL WITH GFR - Abnormal; Notable for the following components:   Glucose, Bld 113 (*)    All other components within normal limits  CBC - Abnormal; Notable for the following components:   RDW 16.4 (*)     All other components within normal limits     IMAGES: CT Chest 11/19/23 (CareEverywhere):  FINDINGS:  LUNG NODULES: No suspicious pulmonary nodules.  LUNGS: Layering secretions in right mainstem bronchus intermedius. Scattered debris in the right middle lobe subsegmental bronchi (series 6, image 176) and left lower lobe subsegmental bronchi (6, 199). Linear opacity in the right middle lobe, likely atelectasis or scar.  PLEURA: No pleural thickening or effusion.  HEART: Heart size normal. No pericardial effusion.  CORONARY ARTERY CALCIFICATION: Moderate.  MEDIASTINUM/HILUM/AXILLA: Scattered subcentimeter mediastinal and bilateral axillary lymph nodes without suspicious features.  OTHER FINDINGS: Symmetric bilateral gynecomastia. Polyarticular degenerative changes.   EKG 11/06/23:  NSR, rate 89  CV:  Echocardiogram 10/08/2022: 1. Left ventricle cavity is normal in size and wall thickness. Normal global wall motion. Normal LV systolic function with EF 53%. Doppler evidence of grade I diastolic dysfunction, normal LAP. 2. Mild tricuspid regurgitation. 3. No evidence of pulmonary hypertension. 4. Agitated saline contrast was injected into a peripheral vein. No intracardiac shunt was seen. A patient foramen ovale was not seen.  Exercise treadmill stress test 10/03/2022: Exercise treadmill stress test performed using Bruce protocol.  Patient reached 8.5 METS, and 91% of age predicted maximum heart rate.  Exercise capacity was fair.  No chest pain reported.  Normal heart rate and hemodynamic response. Stress EKG revealed no ischemic changes. Only occasional  PVC. Low risk study.   Past Medical History:  Diagnosis Date   Arthritis    hands   ASD (atrial septal defect)    with repair in 1972   COPD (chronic obstructive pulmonary disease) (HCC)    mild   Coronary artery disease    Mild CAD per cath in 2010   Cough, persistent 02/22/2016   Diabetes mellitus without  complication (HCC)    type 2   Family history of Lynch syndrome    maternal relatives with a mutation in PMS2 gene   Genetic testing 04/22/2018   Multi-Cancer panel (83 genes) @ [facility] - No pathogenic mutations detected   History of hiatal hernia    Hyperlipidemia    Hypertension    Hypothyroidism    Hypoxia    At night  uses 2L o2 oxygen  concentrator at night   Obesity    Pneumonia    Polycythemia    PVC (premature ventricular contraction)    Status post patch closure of ASD 02/09/2014   Tobacco abuse     Past Surgical History:  Procedure Laterality Date   ASD REPAIR  11/24/1970   CARDIAC CATHETERIZATION  08/28/2009   EF 60%; Mild CAD with normal LV function   COLONOSCOPY  11/24/2009   outlaw; 5 polyps    lipoma removed     right back of neck  2018   STRESS ECHO TEST  11/24/2004   NO ISCHEMIA, NORMAL   TONSILLECTOMY      MEDICATIONS:  aspirin  EC 81 MG tablet   Carboxymethylcellulose Sod PF (THERATEARS) 1 % GEL   COENZYME Q10 PO   dapagliflozin propanediol (FARXIGA) 10 MG TABS tablet   fenofibrate  micronized (LOFIBRA) 134 MG capsule   icosapent  Ethyl (VASCEPA ) 1 g capsule   levothyroxine (SYNTHROID) 112 MCG tablet   losartan (COZAAR) 25 MG tablet   metFORMIN (GLUCOPHAGE-XR) 500 MG 24 hr tablet   metoprolol  tartrate (LOPRESSOR ) 25 MG tablet   OVER THE COUNTER MEDICATION   PSYLLIUM PO   rosuvastatin (CRESTOR) 40 MG tablet   Semaglutide , 2 MG/DOSE, (OZEMPIC , 2 MG/DOSE,) 8 MG/3ML SOPN   No current facility-administered medications for this encounter.   Antoinette Kirschner [facility] Surgical Short Stay/Anesthesiology [facility] Phone [phone] 03/28/2024 10:54 AM

## 2024-03-28 NOTE — Anesthesia Preprocedure Evaluation (Signed)
 Anesthesia Evaluation  Patient identified by MRN, date of birth, ID band Patient awake    Reviewed: Allergy & Precautions, NPO status , Patient's Chart, lab work & pertinent test results  Airway Mallampati: II  TM Distance: >3 FB Neck ROM: Full    Dental no notable dental hx.    Pulmonary COPD,  oxygen  dependent, Current SmokerPatient did not abstain from smoking.   Pulmonary exam normal        Cardiovascular hypertension, Pt. on medications and Pt. on home beta blockers + CAD  Normal cardiovascular exam  S/p ASD repair    Neuro/Psych negative neurological ROS  negative psych ROS   GI/Hepatic Neg liver ROS, hiatal hernia,,,  Endo/Other  diabetes, Oral Hypoglycemic AgentsHypothyroidism  Patient on GLP-1 Agonist  Renal/GU negative Renal ROS     Musculoskeletal  (+) Arthritis ,    Abdominal   Peds  Hematology negative hematology ROS (+)   Anesthesia Other Findings BLADDER NEOPLASM  Reproductive/Obstetrics                             Anesthesia Physical Anesthesia Plan  ASA: 3  Anesthesia Plan: General   Post-op Pain Management:    Induction: Intravenous  PONV Risk Score and Plan: 1 and Ondansetron, Dexamethasone , Midazolam and Treatment may vary due to age or medical condition  Airway Management Planned: LMA  Additional Equipment:   Intra-op Plan:   Post-operative Plan: Extubation in OR  Informed Consent: I have reviewed the patients History and Physical, chart, labs and discussed the procedure including the risks, benefits and alternatives for the proposed anesthesia with the patient or authorized representative who has indicated his/her understanding and acceptance.     Dental advisory given  Plan Discussed with: CRNA  Anesthesia Plan Comments: (PAT note from 5/2)        Anesthesia Quick Evaluation

## 2024-03-29 ENCOUNTER — Encounter (HOSPITAL_COMMUNITY)
Admission: RE | Disposition: A | Discharge status: Home / Self Care | Payer: Self-pay | Source: Ambulatory Visit | Attending: Urology

## 2024-03-29 ENCOUNTER — Ambulatory Visit (HOSPITAL_COMMUNITY): Payer: Self-pay | Admitting: Medical

## 2024-03-29 ENCOUNTER — Ambulatory Visit (HOSPITAL_COMMUNITY)
Admission: RE | Admit: 2024-03-29 | Discharge: 2024-03-29 | Disposition: A | Discharge status: Home / Self Care | Source: Ambulatory Visit | Attending: Urology | Admitting: Urology

## 2024-03-29 ENCOUNTER — Encounter (HOSPITAL_COMMUNITY): Payer: Self-pay | Admitting: Urology

## 2024-03-29 ENCOUNTER — Ambulatory Visit (HOSPITAL_COMMUNITY)

## 2024-03-29 DIAGNOSIS — Z9981 Dependence on supplemental oxygen: Secondary | ICD-10-CM | POA: Diagnosis not present

## 2024-03-29 DIAGNOSIS — I2510 Atherosclerotic heart disease of native coronary artery without angina pectoris: Secondary | ICD-10-CM | POA: Diagnosis not present

## 2024-03-29 DIAGNOSIS — K449 Diaphragmatic hernia without obstruction or gangrene: Secondary | ICD-10-CM | POA: Insufficient documentation

## 2024-03-29 DIAGNOSIS — M1990 Unspecified osteoarthritis, unspecified site: Secondary | ICD-10-CM | POA: Diagnosis not present

## 2024-03-29 DIAGNOSIS — R3129 Other microscopic hematuria: Secondary | ICD-10-CM | POA: Diagnosis not present

## 2024-03-29 DIAGNOSIS — Z9889 Other specified postprocedural states: Secondary | ICD-10-CM | POA: Insufficient documentation

## 2024-03-29 DIAGNOSIS — D414 Neoplasm of uncertain behavior of bladder: Secondary | ICD-10-CM

## 2024-03-29 DIAGNOSIS — I10 Essential (primary) hypertension: Secondary | ICD-10-CM | POA: Insufficient documentation

## 2024-03-29 DIAGNOSIS — C675 Malignant neoplasm of bladder neck: Secondary | ICD-10-CM | POA: Insufficient documentation

## 2024-03-29 DIAGNOSIS — Z79899 Other long term (current) drug therapy: Secondary | ICD-10-CM | POA: Insufficient documentation

## 2024-03-29 DIAGNOSIS — Z7985 Long-term (current) use of injectable non-insulin antidiabetic drugs: Secondary | ICD-10-CM | POA: Insufficient documentation

## 2024-03-29 DIAGNOSIS — F17210 Nicotine dependence, cigarettes, uncomplicated: Secondary | ICD-10-CM | POA: Insufficient documentation

## 2024-03-29 DIAGNOSIS — E039 Hypothyroidism, unspecified: Secondary | ICD-10-CM | POA: Insufficient documentation

## 2024-03-29 DIAGNOSIS — Z7984 Long term (current) use of oral hypoglycemic drugs: Secondary | ICD-10-CM | POA: Insufficient documentation

## 2024-03-29 DIAGNOSIS — D494 Neoplasm of unspecified behavior of bladder: Secondary | ICD-10-CM | POA: Diagnosis not present

## 2024-03-29 DIAGNOSIS — E119 Type 2 diabetes mellitus without complications: Secondary | ICD-10-CM | POA: Diagnosis not present

## 2024-03-29 DIAGNOSIS — J449 Chronic obstructive pulmonary disease, unspecified: Secondary | ICD-10-CM | POA: Diagnosis not present

## 2024-03-29 DIAGNOSIS — D090 Carcinoma in situ of bladder: Secondary | ICD-10-CM | POA: Diagnosis not present

## 2024-03-29 HISTORY — PX: CYSTOSCOPY WITH BIOPSY: SHX5122

## 2024-03-29 LAB — GLUCOSE, CAPILLARY
Glucose-Capillary: 123 mg/dL — ABNORMAL HIGH (ref 70–99)
Glucose-Capillary: 128 mg/dL — ABNORMAL HIGH (ref 70–99)
Glucose-Capillary: 104 mg/dL — ABNORMAL HIGH (ref 70–99)

## 2024-03-29 SURGERY — CYSTOSCOPY, WITH BIOPSY
Anesthesia: General | Site: Pelvis

## 2024-03-29 MED ORDER — PROPOFOL 10 MG/ML IV BOLUS
INTRAVENOUS | Status: DC | PRN
  Administered 2024-03-29: 200 mg via INTRAVENOUS

## 2024-03-29 MED ORDER — OXYCODONE HCL 5 MG PO TABS: 5.0000 mg | ORAL_TABLET | Freq: Once | ORAL | Status: DC | PRN

## 2024-03-29 MED ORDER — PHENYLEPHRINE 80 MCG/ML (10ML) SYRINGE FOR IV PUSH (FOR BLOOD PRESSURE SUPPORT)
PREFILLED_SYRINGE | INTRAVENOUS | Status: AC
  Filled 2024-03-29: qty 10

## 2024-03-29 MED ORDER — KETOROLAC TROMETHAMINE 30 MG/ML IJ SOLN
30.0000 mg | Freq: Once | INTRAMUSCULAR | Status: AC | PRN
  Administered 2024-03-29: 30 mg via INTRAVENOUS

## 2024-03-29 MED ORDER — PROPOFOL 10 MG/ML IV BOLUS
INTRAVENOUS | Status: AC
  Filled 2024-03-29: qty 20

## 2024-03-29 MED ORDER — SODIUM CHLORIDE 0.9 % IR SOLN
Status: DC | PRN
  Administered 2024-03-29: 3000 mL

## 2024-03-29 MED ORDER — INSULIN ASPART 100 UNIT/ML IJ SOLN: 0.0000 [IU] | INTRAMUSCULAR | Status: DC | PRN

## 2024-03-29 MED ORDER — FENTANYL CITRATE (PF) 100 MCG/2ML IJ SOLN
INTRAMUSCULAR | Status: AC
  Filled 2024-03-29: qty 2

## 2024-03-29 MED ORDER — DEXAMETHASONE SODIUM PHOSPHATE 10 MG/ML IJ SOLN
INTRAMUSCULAR | Status: AC
  Filled 2024-03-29: qty 1

## 2024-03-29 MED ORDER — MIDAZOLAM HCL 2 MG/2ML IJ SOLN
INTRAMUSCULAR | Status: AC
  Filled 2024-03-29: qty 2

## 2024-03-29 MED ORDER — KETOROLAC TROMETHAMINE 30 MG/ML IJ SOLN
INTRAMUSCULAR | Status: AC
  Filled 2024-03-29: qty 1

## 2024-03-29 MED ORDER — AMISULPRIDE (ANTIEMETIC) 5 MG/2ML IV SOLN: 10.0000 mg | Freq: Once | INTRAVENOUS | Status: DC | PRN

## 2024-03-29 MED ORDER — CEFAZOLIN SODIUM-DEXTROSE 2-4 GM/100ML-% IV SOLN
2.0000 g | INTRAVENOUS | Status: AC
  Administered 2024-03-29: 2 g via INTRAVENOUS
  Filled 2024-03-29: qty 100

## 2024-03-29 MED ORDER — PROPOFOL 500 MG/50ML IV EMUL
INTRAVENOUS | Status: AC
  Filled 2024-03-29: qty 50

## 2024-03-29 MED ORDER — DEXAMETHASONE SODIUM PHOSPHATE 4 MG/ML IJ SOLN
INTRAMUSCULAR | Status: DC | PRN
Start: 2024-03-29 — End: 2024-03-29
  Administered 2024-03-29: 4 mg via INTRAVENOUS

## 2024-03-29 MED ORDER — MIDAZOLAM HCL 5 MG/5ML IJ SOLN
INTRAMUSCULAR | Status: DC | PRN
  Administered 2024-03-29: 2 mg via INTRAVENOUS

## 2024-03-29 MED ORDER — ONDANSETRON HCL 4 MG/2ML IJ SOLN
INTRAMUSCULAR | Status: AC
  Filled 2024-03-29: qty 2

## 2024-03-29 MED ORDER — ORAL CARE MOUTH RINSE: 15.0000 mL | Freq: Once | OROMUCOSAL | Status: AC

## 2024-03-29 MED ORDER — LIDOCAINE HCL (PF) 2 % IJ SOLN
INTRAMUSCULAR | Status: AC
  Filled 2024-03-29: qty 5

## 2024-03-29 MED ORDER — ONDANSETRON HCL 4 MG/2ML IJ SOLN
INTRAMUSCULAR | Status: DC | PRN
  Administered 2024-03-29: 4 mg via INTRAVENOUS

## 2024-03-29 MED ORDER — ACETAMINOPHEN 10 MG/ML IV SOLN: 1000.0000 mg | Freq: Once | INTRAVENOUS | Status: DC | PRN

## 2024-03-29 MED ORDER — PROPOFOL 500 MG/50ML IV EMUL
INTRAVENOUS | Status: DC | PRN
  Administered 2024-03-29: 75 ug/kg/min via INTRAVENOUS

## 2024-03-29 MED ORDER — FENTANYL CITRATE PF 50 MCG/ML IJ SOSY: 25.0000 ug | PREFILLED_SYRINGE | INTRAMUSCULAR | Status: DC | PRN

## 2024-03-29 MED ORDER — EPHEDRINE 5 MG/ML INJ
INTRAVENOUS | Status: AC
  Filled 2024-03-29: qty 5

## 2024-03-29 MED ORDER — LACTATED RINGERS IV SOLN: INTRAVENOUS | Status: DC

## 2024-03-29 MED ORDER — CHLORHEXIDINE GLUCONATE 0.12 % MT SOLN
15.0000 mL | Freq: Once | OROMUCOSAL | Status: AC
  Administered 2024-03-29: 15 mL via OROMUCOSAL

## 2024-03-29 MED ORDER — OXYCODONE HCL 5 MG/5ML PO SOLN: 5.0000 mg | Freq: Once | ORAL | Status: DC | PRN

## 2024-03-29 MED ORDER — LIDOCAINE HCL (CARDIAC) PF 100 MG/5ML IV SOSY
PREFILLED_SYRINGE | INTRAVENOUS | Status: DC | PRN
  Administered 2024-03-29: 60 mg via INTRAVENOUS

## 2024-03-29 MED ORDER — FENTANYL CITRATE (PF) 100 MCG/2ML IJ SOLN
INTRAMUSCULAR | Status: DC | PRN
  Administered 2024-03-29 (×2): 25 ug via INTRAVENOUS
  Administered 2024-03-29: 50 ug via INTRAVENOUS

## 2024-03-29 MED ORDER — PHENYLEPHRINE 80 MCG/ML (10ML) SYRINGE FOR IV PUSH (FOR BLOOD PRESSURE SUPPORT)
PREFILLED_SYRINGE | INTRAVENOUS | Status: DC | PRN
Start: 2024-03-29 — End: 2024-03-29
  Administered 2024-03-29 (×6): 80 ug via INTRAVENOUS

## 2024-03-29 MED ORDER — EPHEDRINE SULFATE-NACL 50-0.9 MG/10ML-% IV SOSY
PREFILLED_SYRINGE | INTRAVENOUS | Status: DC | PRN
  Administered 2024-03-29: 5 mg via INTRAVENOUS

## 2024-03-29 SURGICAL SUPPLY — 13 items
BAG URINE DRAIN 2000ML AR STRL (UROLOGICAL SUPPLIES) IMPLANT
BAG URO CATCHER STRL LF (MISCELLANEOUS) ×1 IMPLANT
DRAPE FOOT SWITCH (DRAPES) ×1 IMPLANT
GLOVE SURG LX STRL 7.5 STRW (GLOVE) ×1 IMPLANT
GOWN STRL REUS W/ TWL XL LVL3 (GOWN DISPOSABLE) ×1 IMPLANT
GUIDEWIRE ZIPWRE .038 STRAIGHT (WIRE) IMPLANT
KIT TURNOVER KIT A (KITS) IMPLANT
LOOP CUT BIPOLAR 24F LRG (ELECTROSURGICAL) IMPLANT
MANIFOLD NEPTUNE II (INSTRUMENTS) ×1 IMPLANT
PACK CYSTO (CUSTOM PROCEDURE TRAY) ×1 IMPLANT
STENT URET 6FRX26 CONTOUR (STENTS) IMPLANT
TUBING CONNECTING 10 (TUBING) ×1 IMPLANT
TUBING UROLOGY SET (TUBING) ×1 IMPLANT

## 2024-03-29 NOTE — Transfer of Care (Signed)
 Immediate Anesthesia Transfer of Care Note  Patient: Chris Long  Procedure(s) Performed: CYSTOSCOPY, WITH BIOPSY (Pelvis)  Patient Location: PACU  Anesthesia Type:General  Level of Consciousness: drowsy  Airway & Oxygen  Therapy: Patient Spontanous Breathing and Patient connected to face mask oxygen   Post-op Assessment: Report given to RN and Post -op Vital signs reviewed and stable  Post vital signs: Reviewed and stable  Last Vitals:  Vitals Value Taken Time  BP 112/74 03/29/24 1104  Temp    Pulse 86 03/29/24 1105  Resp 21 03/29/24 1105  SpO2 96 % 03/29/24 1105  Vitals shown include unfiled device data.  Last Pain:  Vitals:   03/29/24 0748  TempSrc:   PainSc: 0-No pain      Patients Stated Pain Goal: 4 (03/29/24 0748)  Complications: No notable events documented.

## 2024-03-29 NOTE — Anesthesia Procedure Notes (Signed)
 Procedure Name: LMA Insertion Date/Time: 03/29/2024 9:58 AM  Performed by: Elaina Graver, CRNAPre-anesthesia Checklist: Patient identified, Emergency Drugs available, Suction available and Patient being monitored Patient Re-evaluated:Patient Re-evaluated prior to induction Oxygen  Delivery Method: Circle System Utilized Preoxygenation: Pre-oxygenation with 100% oxygen  Induction Type: IV induction Ventilation: Mask ventilation without difficulty LMA: LMA inserted LMA Size: 4.0 Number of attempts: 1 Airway Equipment and Method: Bite block Placement Confirmation: positive ETCO2 Tube secured with: Tape Dental Injury: Teeth and Oropharynx as per pre-operative assessment

## 2024-03-29 NOTE — H&P (Signed)
 H&P  Chief Complaint: Bladder neoplasm of uncertain malignant potential.  History of Present Illness: Chris Long is a 63 year old male who underwent a microscopic hematuria evaluation.  CT scan from March 2025 was benign with about a 60 g prostate.  He underwent office cystoscopy which revealed some papillary changes behind the left ureteral orifice.  He was brought today for bladder biopsy versus TUR with the possible need for a stent.  He has been well without dysuria or gross hematuria.  No fever.  Past Medical History:  Diagnosis Date   Arthritis    hands   ASD (atrial septal defect)    with repair in 1972   COPD (chronic obstructive pulmonary disease) (HCC)    mild   Coronary artery disease    Mild CAD per cath in 2010   Cough, persistent 02/22/2016   Diabetes mellitus without complication (HCC)    type 2   Family history of Lynch syndrome    maternal relatives with a mutation in PMS2 gene   Genetic testing 04/22/2018   Multi-Cancer panel (83 genes) @ [facility] - No pathogenic mutations detected   History of hiatal hernia    Hyperlipidemia    Hypertension    Hypothyroidism    Hypoxia    At night  uses 2L o2 oxygen  concentrator at night   Obesity    Pneumonia    Polycythemia    PVC (premature ventricular contraction)    Status post patch closure of ASD 02/09/2014   Tobacco abuse    Past Surgical History:  Procedure Laterality Date   ASD REPAIR  11/24/1970   CARDIAC CATHETERIZATION  08/28/2009   EF 60%; Mild CAD with normal LV function   COLONOSCOPY  11/24/2009   outlaw; 5 polyps    lipoma removed     right back of neck  2018   STRESS ECHO TEST  11/24/2004   NO ISCHEMIA, NORMAL   TONSILLECTOMY      Home Medications:  Medications Prior to Admission  Medication Sig Dispense Refill Last Dose/Taking   aspirin  EC 81 MG tablet Take 1 tablet (81 mg total) by mouth daily. Swallow whole. (Patient taking differently: Take 81 mg by mouth every evening. Swallow whole.) 30 tablet  12 Taking Differently   Carboxymethylcellulose Sod PF (THERATEARS) 1 % GEL Place 1 drop into both eyes in the morning.   Taking   COENZYME Q10 PO Take 1 capsule by mouth in the morning.   Taking   dapagliflozin propanediol (FARXIGA) 10 MG TABS tablet Take 10 mg by mouth in the morning.   Taking   fenofibrate  micronized (LOFIBRA) 134 MG capsule Take 1 capsule (134 mg total) by mouth daily before breakfast. (Patient taking differently: Take 134 mg by mouth every evening.) 90 capsule 3 Taking Differently   icosapent  Ethyl (VASCEPA ) 1 g capsule Take 2 capsules (2 g total) by mouth 2 (two) times daily. 120 capsule 11 Taking   levothyroxine (SYNTHROID) 112 MCG tablet Take 112 mcg by mouth in the morning.   Taking   losartan (COZAAR) 25 MG tablet Take 12.5 mg by mouth every evening.   Taking   metFORMIN (GLUCOPHAGE-XR) 500 MG 24 hr tablet Take 500 mg by mouth in the morning and at bedtime.  6 Taking   metoprolol  tartrate (LOPRESSOR ) 25 MG tablet Take 1 tablet (25 mg total) by mouth 2 (two) times daily. 180 tablet 3 Taking   OVER THE COUNTER MEDICATION Take 2 capsules by mouth in the morning. [place] Mushrooms  Taking   PSYLLIUM PO Take 2 capsules by mouth in the morning.   Taking   rosuvastatin (CRESTOR) 40 MG tablet Take 40 mg by mouth in the morning.   Taking   Semaglutide , 2 MG/DOSE, (OZEMPIC , 2 MG/DOSE,) 8 MG/3ML SOPN Inject 2 mg into the skin once a week. (Patient taking differently: Inject 2 mg into the skin every Monday.) 3 mL 0 Taking Differently   Allergies: No Known Allergies  Family History  Problem Relation Age of Onset   Heart attack Mother        X2   Heart failure Mother    Hypertension Father    COPD Father    Lung cancer Father        deceased 69; smoker   Cancer Paternal Uncle 58       GI cancer; deceased 34   Prostate cancer Maternal Uncle 60       deceased 6   Stomach cancer Paternal Uncle        deceased 28   Breast cancer Cousin        daughter of a unaffected  maternal aunt; deceased 30   Breast cancer Other        mother's paternal half-sister   Social History:  reports that he has been smoking cigarettes. He has a 15 pack-year smoking history. He has never used smokeless tobacco. He reports that he does not drink alcohol  and does not use drugs.  ROS: A complete review of systems was performed.  All systems are negative except for pertinent findings as noted. Review of Systems  All other systems reviewed and are negative.    Physical Exam:  Vital signs in last 24 hours: Temp:  [98.3 F (36.8 C)] 98.3 F (36.8 C) (05/06 0709) Pulse Rate:  [91] 91 (05/06 0709) Resp:  [16] 16 (05/06 0709) BP: (112)/(87) 112/87 (05/06 0709) SpO2:  [98 %] 98 % (05/06 0709) General:  Alert and oriented, No acute distress HEENT: Normocephalic, atraumatic Cardiovascular: Regular rate and rhythm Lungs: Regular rate and effort Abdomen: Soft, nontender, nondistended, no abdominal masses Back: No CVA tenderness Extremities: No edema Neurologic: Grossly intact  Laboratory Data:  No results found for this or any previous visit (from the past 24 hours). No results found for this or any previous visit (from the past 240 hours). Creatinine: Recent Labs    03/25/24 1012  CREATININE 1.20    Impression/Assessment:  Bladder neoplasm of uncertain malignant potential, microscopic hematuria-  Plan:  I discussed with Hilarie Lovely and John the nature, potential benefits, risks and alternatives to cystoscopy with bladder biopsy and fulguration, possible transurethral resection, possible ureteral stent, including side effects of the proposed treatment, the likelihood of the patient achieving the goals of the procedure, and any potential problems that might occur during the procedure or recuperation.  Discussed again he may need a stent or a Foley catheter.  All questions answered. Patient elects to proceed.   Christina Coyer 03/29/2024

## 2024-03-29 NOTE — Anesthesia Postprocedure Evaluation (Signed)
 Anesthesia Post Note  Patient: Chris Long  Procedure(s) Performed: CYSTOSCOPY, WITH BIOPSY (Pelvis)     Patient location during evaluation: PACU Anesthesia Type: General Level of consciousness: awake Pain management: pain level controlled Vital Signs Assessment: post-procedure vital signs reviewed and stable Respiratory status: spontaneous breathing, nonlabored ventilation and respiratory function stable Cardiovascular status: blood pressure returned to baseline and stable Postop Assessment: no apparent nausea or vomiting Anesthetic complications: no   No notable events documented.  Last Vitals:  Vitals:   03/29/24 1130 03/29/24 1217  BP: 116/76 109/74  Pulse: 84 81  Resp: 19 14  Temp:  (!) 36.4 C  SpO2: 96% 94%    Last Pain:  Vitals:   03/29/24 1217  TempSrc:   PainSc: 2                  Griselda Tosh P Beula Joyner

## 2024-03-29 NOTE — Discharge Instructions (Signed)
 Removal of the stent: Remove the stent on Monday morning, Apr 04, 2024 by gently pulling the string as instructed

## 2024-03-29 NOTE — Op Note (Signed)
 Preoperative diagnosis: Bladder neoplasm of uncertain potential, microscopic hematuria Postoperative diagnosis: Same  Procedure: TURBT 2 to 5 cm, diagnostic left ureteroscopy, left ureteral stent placement  Surgeon: Derrick Fling  Anesthesia: General  Indication for procedure: Chris Long is a 63 year old male with a history of microscopic hematuria.  Cystoscopy revealed papillary changes behind the left ureteral orifice and he was brought today for biopsy and/or resection.  Findings: On exam under anesthesia the penis was circumcised without mass or lesion.  He had distal penile hypospadias.  Otherwise the glans and meatus appeared normal.  Scrotum appeared normal.  Testicles were descended bilaterally and palpably normal.  On DRE prostate was about 40 g and smooth without hard area or nodule.  On cystoscopy the urethra was unremarkable, prostate with moderate BPH borderline obstruction slightly high bladder neck.  Once in the bladder the trigone and ureteral orifices were in the normal orthotopic position.  There was some papillary and almost ulcerative change posterior and lateral to the left ureteral orifice.  Some early papillary changes came around the UO and then came toward the left bladder neck.  The left ureteral orifice was not involved.  Some resection of fulguration came right up to the lateral edge of the left ureteral orifice.  It did E flux but it seems sluggish so I placed a stent for a few days no resection of the UO necessary.  Because the left intramural tunnel looked full almost like a stone and on the CT contrast did not come through the tunnel I wanted to make sure there was no tumor in the intramural tunnel or the distal left ureter.  On left ureteroscopy there was no tumor in the intramural tunnel and no tumor in the left distal ureter.    Description of procedure: After consent was obtained patient brought to the operating room.  After adequate anesthesia he is placed lithotomy position  and prepped and draped in the usual sterile fashion.  Timeout was performed to confirm the patient and procedure.  The cystoscope was passed per urethra and the bladder carefully inspected with a 30 and 70 degree scope.  I then passed a long single semirigid ureteroscope easily into the left ureter to inspect the intramural tunnel in the left distal ureter.  All this was clear of tumor and appeared normal.  I then filled the bladder and took a loop and resected the abnormal area posterior lateral to the left ureteral orifice and carefully came up and did some ablation and fulguration just lateral to the UO and then another slight resection at the left bladder neck.  Areas were all fulgurated with excellent hemostasis under low pressure.  No other obvious abnormal mucosa. Total area resected and fulgurated was approximately 3 cm. I observed the left ureteral orifice for a time and there was some E flux but it was sluggish.  There was not a good jet.  Therefore I switched out to the cystoscope and passed a zip wire under direct vision up the left ureter until it met the usual resistance up in the kidney.  We then passed a 626 cm stent which passed normally without any resistance.  The wire was removed with a good coil seen in the kidney.  I left a string on the stent.  The bladder was drained and the scope removed.  The string was taped with the patient.  He was then awakened taken the cover room in stable condition.  Complications: None  Blood loss: Minimal  Specimens to pathology:  Left bladder tumor biopsy/resection  Drains: 6 x 26 cm left ureteral stent with string  Disposition: Patient stable to PACU.  I discussed the procedure, postop care and follow-up with Chris Long

## 2024-03-30 ENCOUNTER — Encounter (HOSPITAL_COMMUNITY): Payer: Self-pay | Admitting: Urology

## 2024-03-30 LAB — SURGICAL PATHOLOGY

## 2024-04-06 DIAGNOSIS — C672 Malignant neoplasm of lateral wall of bladder: Secondary | ICD-10-CM | POA: Diagnosis not present

## 2024-04-20 ENCOUNTER — Other Ambulatory Visit: Payer: Self-pay | Admitting: Cardiology

## 2024-04-20 DIAGNOSIS — L723 Sebaceous cyst: Secondary | ICD-10-CM | POA: Diagnosis not present

## 2024-04-20 DIAGNOSIS — L821 Other seborrheic keratosis: Secondary | ICD-10-CM | POA: Diagnosis not present

## 2024-04-25 ENCOUNTER — Other Ambulatory Visit (HOSPITAL_COMMUNITY): Payer: Self-pay

## 2024-04-25 ENCOUNTER — Other Ambulatory Visit: Payer: Self-pay | Admitting: Cardiology

## 2024-04-27 DIAGNOSIS — C672 Malignant neoplasm of lateral wall of bladder: Secondary | ICD-10-CM | POA: Diagnosis not present

## 2024-04-27 DIAGNOSIS — Z5111 Encounter for antineoplastic chemotherapy: Secondary | ICD-10-CM | POA: Diagnosis not present

## 2024-05-04 DIAGNOSIS — C672 Malignant neoplasm of lateral wall of bladder: Secondary | ICD-10-CM | POA: Diagnosis not present

## 2024-05-04 DIAGNOSIS — B958 Unspecified staphylococcus as the cause of diseases classified elsewhere: Secondary | ICD-10-CM | POA: Diagnosis not present

## 2024-05-04 DIAGNOSIS — N390 Urinary tract infection, site not specified: Secondary | ICD-10-CM | POA: Diagnosis not present

## 2024-05-11 DIAGNOSIS — C672 Malignant neoplasm of lateral wall of bladder: Secondary | ICD-10-CM | POA: Diagnosis not present

## 2024-05-16 ENCOUNTER — Telehealth: Payer: Self-pay | Admitting: Pharmacist

## 2024-05-16 ENCOUNTER — Other Ambulatory Visit (HOSPITAL_COMMUNITY): Payer: Self-pay

## 2024-05-16 DIAGNOSIS — E7849 Other hyperlipidemia: Secondary | ICD-10-CM

## 2024-05-16 NOTE — Telephone Encounter (Signed)
 Call pt to remind about f/u lipid lab. Tolerates fenofibrate , statin and Vascepa  well without side effects. Will go for f/u lipid lab June 24.

## 2024-05-17 ENCOUNTER — Other Ambulatory Visit (HOSPITAL_COMMUNITY): Payer: Self-pay

## 2024-05-17 DIAGNOSIS — E7849 Other hyperlipidemia: Secondary | ICD-10-CM | POA: Diagnosis not present

## 2024-05-18 DIAGNOSIS — C672 Malignant neoplasm of lateral wall of bladder: Secondary | ICD-10-CM | POA: Diagnosis not present

## 2024-05-18 LAB — LIPID PANEL
Chol/HDL Ratio: 3.7 ratio (ref 0.0–5.0)
Cholesterol, Total: 149 mg/dL (ref 100–199)
HDL: 40 mg/dL (ref >39)
LDL Chol Calc (NIH): 79 mg/dL (ref 0–99)
Triglycerides: 177 mg/dL — ABNORMAL HIGH (ref 0–149)
VLDL Cholesterol Cal: 30 mg/dL (ref 5–40)

## 2024-05-19 ENCOUNTER — Ambulatory Visit: Payer: Self-pay | Admitting: Pharmacist

## 2024-05-19 DIAGNOSIS — E7849 Other hyperlipidemia: Secondary | ICD-10-CM

## 2024-05-19 NOTE — Telephone Encounter (Signed)
 Call to discuss lipid lab results: Triglycerides (TG) and LDL cholesterol remain above goal, with current levels at 177 mg/dL and 79 mg/dL, respectively. Goals are <150 mg/dL for TG and <44 mg/dL for LDL. Patient is already on maximal triglyceride-lowering therapy.  For triglycerides, lifestyle interventions were discussed, including dietary changes and increased physical activity.   For LDL management, suggested starting a PCSK9 inhibitor; however, the patient prefers to try lifestyle changes first and reassess.  Plan: Repeat lipid panel in 3 months to evaluate progress.

## 2024-06-08 DIAGNOSIS — Z5111 Encounter for antineoplastic chemotherapy: Secondary | ICD-10-CM | POA: Diagnosis not present

## 2024-06-08 DIAGNOSIS — C672 Malignant neoplasm of lateral wall of bladder: Secondary | ICD-10-CM | POA: Diagnosis not present

## 2024-06-08 DIAGNOSIS — R8271 Bacteriuria: Secondary | ICD-10-CM | POA: Diagnosis not present

## 2024-06-15 DIAGNOSIS — C672 Malignant neoplasm of lateral wall of bladder: Secondary | ICD-10-CM | POA: Diagnosis not present

## 2024-06-15 DIAGNOSIS — B951 Streptococcus, group B, as the cause of diseases classified elsewhere: Secondary | ICD-10-CM | POA: Diagnosis not present

## 2024-06-15 DIAGNOSIS — N390 Urinary tract infection, site not specified: Secondary | ICD-10-CM | POA: Diagnosis not present

## 2024-06-17 ENCOUNTER — Encounter: Payer: Self-pay | Admitting: Cardiology

## 2024-06-17 DIAGNOSIS — D751 Secondary polycythemia: Secondary | ICD-10-CM | POA: Diagnosis not present

## 2024-06-17 DIAGNOSIS — E039 Hypothyroidism, unspecified: Secondary | ICD-10-CM | POA: Diagnosis not present

## 2024-06-17 DIAGNOSIS — N182 Chronic kidney disease, stage 2 (mild): Secondary | ICD-10-CM | POA: Diagnosis not present

## 2024-06-17 DIAGNOSIS — C679 Malignant neoplasm of bladder, unspecified: Secondary | ICD-10-CM | POA: Diagnosis not present

## 2024-06-17 DIAGNOSIS — E1122 Type 2 diabetes mellitus with diabetic chronic kidney disease: Secondary | ICD-10-CM | POA: Diagnosis not present

## 2024-06-22 ENCOUNTER — Other Ambulatory Visit (HOSPITAL_COMMUNITY): Payer: Self-pay

## 2024-06-22 ENCOUNTER — Telehealth: Payer: Self-pay | Admitting: Pharmacy Technician

## 2024-06-22 ENCOUNTER — Encounter: Payer: Self-pay | Admitting: Cardiology

## 2024-06-22 NOTE — Telephone Encounter (Signed)
 Spoke to the patient over the phone personally.  Patient is willing to be on Repatha . Also coordinated care with Pharm.D. who will reach out to him shortly to coordinate care.  Dr. Beau Vanduzer

## 2024-06-22 NOTE — Telephone Encounter (Signed)
 Advice req    Pharmacy Patient Advocate Encounter   Received notification from Patient Advice Request messages that prior authorization for REPATHA  is required/requested.   Insurance verification completed.   The patient is insured through [employer] .   Per test claim: PA required; PA submitted to above mentioned insurance via LATENT Key/confirmation #/EOC [id number] Status is pending

## 2024-06-23 DIAGNOSIS — C672 Malignant neoplasm of lateral wall of bladder: Secondary | ICD-10-CM | POA: Diagnosis not present

## 2024-06-23 DIAGNOSIS — Z5111 Encounter for antineoplastic chemotherapy: Secondary | ICD-10-CM | POA: Diagnosis not present

## 2024-06-23 MED ORDER — REPATHA SURECLICK 140 MG/ML ~~LOC~~ SOAJ: 140.0000 mg | SUBCUTANEOUS | 3 refills | Status: AC

## 2024-06-23 NOTE — Telephone Encounter (Signed)
 I sent the patient a message saying it was approved before realizing a prescription had not been sent in. Patient aware approved and asking for this to go to [facility]. Thank you

## 2024-06-23 NOTE — Telephone Encounter (Signed)
 Updated plan according to recent lipid lab  discussed with the pt.   Start taking Repatha  140 mg SQ Q14D, continue taking Crestor 40 mg daily, Vascepa  2 gm twice daily and stop taking fenofibrate . Will repeat lab in 3 months

## 2024-06-23 NOTE — Addendum Note (Signed)
 Addended by: Ameliah Baskins K on: 06/23/2024 02:39 PM   Modules accepted: Orders

## 2024-06-23 NOTE — Telephone Encounter (Signed)
 Pharmacy Patient Advocate Encounter  Received notification from [facility] that Prior Authorization for repatha  has been APPROVED from 06/22/24 to 06/22/25   PA #/Case ID/Reference #: [id number]

## 2024-06-28 NOTE — Telephone Encounter (Signed)
 Concern addressed - see encounter from June 22, 2024 for more info

## 2024-08-22 ENCOUNTER — Other Ambulatory Visit (HOSPITAL_COMMUNITY): Payer: Self-pay

## 2024-08-22 MED ORDER — FLUZONE 0.5 ML IM SUSY
0.5000 mL | PREFILLED_SYRINGE | Freq: Once | INTRAMUSCULAR | 0 refills | Status: AC
Start: 2024-08-22 — End: 2024-08-23
  Filled 2024-08-22: qty 0.5, 1d supply, fill #0

## 2024-08-22 MED ORDER — COVID-19 MRNA VAC-TRIS(PFIZER) 30 MCG/0.3ML IM SUSY
0.3000 mL | PREFILLED_SYRINGE | Freq: Once | INTRAMUSCULAR | 0 refills | Status: AC
  Filled 2024-08-22: qty 0.3, 1d supply, fill #0

## 2024-09-01 NOTE — Telephone Encounter (Signed)
 Call to remind patient for lipid lab. N/A LVM

## 2024-09-06 DIAGNOSIS — E7849 Other hyperlipidemia: Secondary | ICD-10-CM | POA: Diagnosis not present

## 2024-09-06 LAB — LIPID PANEL

## 2024-09-07 LAB — LIPID PANEL
Cholesterol, Total: 86 mg/dL — ABNORMAL LOW (ref 100–199)
HDL: 39 mg/dL — AB (ref >39)
LDL CALC COMMENT:: 2.2 ratio (ref 0.0–5.0)
LDL Chol Calc (NIH): 24 mg/dL (ref 0–99)
Triglycerides: 132 mg/dL (ref 0–149)
VLDL Cholesterol Cal: 23 mg/dL (ref 5–40)

## 2024-09-09 ENCOUNTER — Telehealth: Payer: Self-pay

## 2024-09-09 ENCOUNTER — Other Ambulatory Visit: Payer: Self-pay | Admitting: Hematology and Oncology

## 2024-09-09 DIAGNOSIS — D751 Secondary polycythemia: Secondary | ICD-10-CM

## 2024-09-09 NOTE — Telephone Encounter (Signed)
 Called to offer appt today. He declined, he is out of town and will be back in town on 10/22. He will be available for appt starting on 09/15/24.

## 2024-09-09 NOTE — Telephone Encounter (Signed)
 10/23: labs, see me at 320 pm and phlebotomy after?

## 2024-09-09 NOTE — Telephone Encounter (Signed)
 Message sent to scheduler and appts scheduled.

## 2024-09-09 NOTE — Telephone Encounter (Signed)
 I will try to see him today

## 2024-09-09 NOTE — Telephone Encounter (Signed)
 He called and left a message requesting appt. He can no longer donate blood due to having bladder cancer. He did a home test and HGB is over 18. Requesting appts for lab and phlebotomy.

## 2024-09-15 ENCOUNTER — Inpatient Hospital Stay (HOSPITAL_BASED_OUTPATIENT_CLINIC_OR_DEPARTMENT_OTHER): Admitting: Hematology and Oncology

## 2024-09-15 ENCOUNTER — Inpatient Hospital Stay

## 2024-09-15 ENCOUNTER — Inpatient Hospital Stay: Attending: Hematology and Oncology

## 2024-09-15 ENCOUNTER — Encounter: Payer: Self-pay | Admitting: Hematology and Oncology

## 2024-09-15 VITALS — BP 114/82 | HR 103 | Temp 99.2°F | Resp 18 | Ht 73.0 in | Wt 221.0 lb

## 2024-09-15 DIAGNOSIS — C672 Malignant neoplasm of lateral wall of bladder: Secondary | ICD-10-CM | POA: Diagnosis not present

## 2024-09-15 DIAGNOSIS — Z8551 Personal history of malignant neoplasm of bladder: Secondary | ICD-10-CM | POA: Insufficient documentation

## 2024-09-15 DIAGNOSIS — F17210 Nicotine dependence, cigarettes, uncomplicated: Secondary | ICD-10-CM | POA: Insufficient documentation

## 2024-09-15 DIAGNOSIS — D751 Secondary polycythemia: Secondary | ICD-10-CM | POA: Diagnosis not present

## 2024-09-15 DIAGNOSIS — C679 Malignant neoplasm of bladder, unspecified: Secondary | ICD-10-CM | POA: Insufficient documentation

## 2024-09-15 DIAGNOSIS — Z720 Tobacco use: Secondary | ICD-10-CM | POA: Diagnosis not present

## 2024-09-15 DIAGNOSIS — R319 Hematuria, unspecified: Secondary | ICD-10-CM | POA: Diagnosis not present

## 2024-09-15 LAB — CBC WITH DIFFERENTIAL/PLATELET
Abs Immature Granulocytes: 0.02 K/uL (ref 0.00–0.07)
Basophils Absolute: 0.1 K/uL (ref 0.0–0.1)
Basophils Relative: 1 %
Eosinophils Absolute: 0.2 K/uL (ref 0.0–0.5)
Eosinophils Relative: 2 %
HCT: 46 % (ref 39.0–52.0)
Hemoglobin: 15.3 g/dL (ref 13.0–17.0)
Immature Granulocytes: 0 %
Lymphocytes Relative: 28 %
Lymphs Abs: 2.4 K/uL (ref 0.7–4.0)
MCH: 28.4 pg (ref 26.0–34.0)
MCHC: 33.3 g/dL (ref 30.0–36.0)
MCV: 85.5 fL (ref 80.0–100.0)
Monocytes Absolute: 0.9 K/uL (ref 0.1–1.0)
Monocytes Relative: 10 %
Neutro Abs: 5.2 K/uL (ref 1.7–7.7)
Neutrophils Relative %: 59 %
Platelets: 190 K/uL (ref 150–400)
RBC: 5.38 MIL/uL (ref 4.22–5.81)
RDW: 16.9 % — ABNORMAL HIGH (ref 11.5–15.5)
WBC: 8.8 K/uL (ref 4.0–10.5)
nRBC: 0 % (ref 0.0–0.2)

## 2024-09-15 NOTE — Progress Notes (Signed)
 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 and has been donating blood intermittently Unfortunately, he was recently diagnosed with superficial bladder cancer and is not able to donate blood Repeat CBC today showed stable blood count with hematocrit less than 50 and total hemoglobin of 15.3 He does not need phlebotomy today He is getting labs done frequently at his primary care doctor's office every 3 months I recommend the patient to get CBC checked at the same time; he will notify me if hemoglobin is greater than 18 or hematocrit is greater than 50 In the situation, I will order phlebotomy The goal would be to keep his hemoglobin just within normal range of 17 Tobacco abuse I encouraged the patient to quit smoking.  He is still not ready to quit   No orders of the defined types were placed in this encounter.    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

## 2024-09-15 NOTE — Assessment & Plan Note (Addendum)
I encouraged the patient to quit smoking.  He is still not ready to quit

## 2024-09-15 NOTE — Assessment & Plan Note (Addendum)
 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 and has been donating blood intermittently Unfortunately, he was recently diagnosed with superficial bladder cancer and is not able to donate blood Repeat CBC today showed stable blood count with hematocrit less than 50 and total hemoglobin of 15.3 He does not need phlebotomy today He is getting labs done frequently at his primary care doctor's office every 3 months I recommend the patient to get CBC checked at the same time; he will notify me if hemoglobin is greater than 18 or hematocrit is greater than 50 In the situation, I will order phlebotomy The goal would be to keep his hemoglobin just within normal range of 17

## 2024-09-15 NOTE — Assessment & Plan Note (Addendum)
 The patient has noninvasive bladder cancer He will continue follow up with urologist

## 2024-10-03 DIAGNOSIS — E119 Type 2 diabetes mellitus without complications: Secondary | ICD-10-CM | POA: Diagnosis not present

## 2024-10-03 DIAGNOSIS — Z961 Presence of intraocular lens: Secondary | ICD-10-CM | POA: Diagnosis not present

## 2024-10-03 DIAGNOSIS — H524 Presbyopia: Secondary | ICD-10-CM | POA: Diagnosis not present

## 2024-10-04 DIAGNOSIS — M65331 Trigger finger, right middle finger: Secondary | ICD-10-CM | POA: Diagnosis not present

## 2024-10-05 DIAGNOSIS — Z8551 Personal history of malignant neoplasm of bladder: Secondary | ICD-10-CM | POA: Diagnosis not present

## 2024-10-07 DIAGNOSIS — Z1212 Encounter for screening for malignant neoplasm of rectum: Secondary | ICD-10-CM | POA: Diagnosis not present

## 2024-10-12 DIAGNOSIS — Z8551 Personal history of malignant neoplasm of bladder: Secondary | ICD-10-CM | POA: Diagnosis not present

## 2024-10-14 DIAGNOSIS — E1122 Type 2 diabetes mellitus with diabetic chronic kidney disease: Secondary | ICD-10-CM | POA: Diagnosis not present

## 2024-10-14 DIAGNOSIS — Z0000 Encounter for general adult medical examination without abnormal findings: Secondary | ICD-10-CM | POA: Diagnosis not present

## 2024-10-14 DIAGNOSIS — R82998 Other abnormal findings in urine: Secondary | ICD-10-CM | POA: Diagnosis not present

## 2024-10-14 DIAGNOSIS — C679 Malignant neoplasm of bladder, unspecified: Secondary | ICD-10-CM | POA: Diagnosis not present

## 2024-10-26 DIAGNOSIS — C679 Malignant neoplasm of bladder, unspecified: Secondary | ICD-10-CM | POA: Diagnosis not present

## 2024-10-26 DIAGNOSIS — Z8551 Personal history of malignant neoplasm of bladder: Secondary | ICD-10-CM | POA: Diagnosis not present

## 2024-10-29 ENCOUNTER — Other Ambulatory Visit: Payer: Self-pay | Admitting: Cardiology

## 2024-11-02 DIAGNOSIS — M65331 Trigger finger, right middle finger: Secondary | ICD-10-CM | POA: Diagnosis not present

## 2024-11-07 DIAGNOSIS — Z8551 Personal history of malignant neoplasm of bladder: Secondary | ICD-10-CM | POA: Diagnosis not present

## 2024-11-08 ENCOUNTER — Other Ambulatory Visit: Payer: Self-pay | Admitting: Cardiology

## 2024-11-09 ENCOUNTER — Other Ambulatory Visit: Payer: Self-pay | Admitting: Cardiology

## 2024-11-10 ENCOUNTER — Encounter: Payer: Self-pay | Admitting: Cardiology

## 2024-11-12 ENCOUNTER — Other Ambulatory Visit (HOSPITAL_COMMUNITY): Payer: Self-pay

## 2024-12-01 ENCOUNTER — Other Ambulatory Visit: Payer: Self-pay | Admitting: Cardiology

## 2024-12-05 ENCOUNTER — Encounter: Payer: Self-pay | Admitting: Cardiology

## 2024-12-05 ENCOUNTER — Ambulatory Visit: Attending: Cardiology | Admitting: Cardiology

## 2024-12-05 VITALS — BP 110/82 | HR 89 | Ht 73.0 in | Wt 214.0 lb

## 2024-12-05 DIAGNOSIS — E782 Mixed hyperlipidemia: Secondary | ICD-10-CM

## 2024-12-05 DIAGNOSIS — F17210 Nicotine dependence, cigarettes, uncomplicated: Secondary | ICD-10-CM

## 2024-12-05 DIAGNOSIS — I2510 Atherosclerotic heart disease of native coronary artery without angina pectoris: Secondary | ICD-10-CM

## 2024-12-05 DIAGNOSIS — E119 Type 2 diabetes mellitus without complications: Secondary | ICD-10-CM

## 2024-12-05 DIAGNOSIS — Z8774 Personal history of (corrected) congenital malformations of heart and circulatory system: Secondary | ICD-10-CM

## 2024-12-05 MED ORDER — ICOSAPENT ETHYL 1 G PO CAPS: 2.0000 g | ORAL_CAPSULE | Freq: Two times a day (BID) | ORAL | 3 refills | Status: AC

## 2024-12-05 MED ORDER — METOPROLOL SUCCINATE ER 50 MG PO TB24: 50.0000 mg | ORAL_TABLET | Freq: Every day | ORAL | 0 refills | Status: AC

## 2024-12-05 MED ORDER — ROSUVASTATIN CALCIUM 20 MG PO TABS: 20.0000 mg | ORAL_TABLET | Freq: Every day | ORAL | 3 refills | Status: AC

## 2024-12-05 NOTE — Patient Instructions (Signed)
 Medication Instructions:  STOP taking Metoprolol  TARTRATE (Lopressor )  START taking Metoprolol  SUCCINATE (Toprol  XL) 50 mg. Take one (1) tablet by mouth once daily in the morning.   DECREASE  Rosuvastatin  (Crestor ) to 20 mg. Take one (1) tablet by mouth once daily.  *If you need a refill on your cardiac medications before your next appointment, please call your pharmacy*  Lab Work: None ordered If you have labs (blood work) drawn today and your tests are completely normal, you will receive your results only by: MyChart Message (if you have MyChart) OR A paper copy in the mail If you have any lab test that is abnormal or we need to change your treatment, we will call you to review the results.  Testing/Procedures: None ordered  Follow-Up: At [facility], you and your health needs are our priority.  As part of our continuing mission to provide you with exceptional heart care, our providers are all part of one team.  This team includes your primary Cardiologist (physician) and Advanced Practice Providers or APPs (Physician Assistants and Nurse Practitioners) who all work together to provide you with the care you need, when you need it.  Your next appointment:   1 year(s)  Provider:   Madonna Large, DO    We recommend signing up for the patient portal called MyChart.  Sign up information is provided on this After Visit Summary.  MyChart is used to connect with patients for Virtual Visits (Telemedicine).  Patients are able to view lab/test results, encounter notes, upcoming appointments, etc.  Non-urgent messages can be sent to your provider as well.   To learn more about what you can do with MyChart, go to [email or web address].

## 2024-12-05 NOTE — Progress Notes (Signed)
 " Cardiology Office Note:  .   Date:  12/05/2024  ID:  Chris Long, DOB 04/23/1961, MRN [id number] PCP:  Onita Rush, MD  Former Cardiology Providers: Dr. [facility] Providers Cardiologist:  Madonna Large, DO , [facility] (established care 07/04/2022) Electrophysiologist:  None  Click to update primary MD,subspecialty MD or APP then REFRESH:1}    Chief Complaint  Patient presents with   Follow-up    1 year - CAC and Lipids     History of Present Illness: .   Chris Long is a 64 y.o.  male whose past medical history and cardiovascular risk factors includes:Bladder Cancer, Non-insulin -dependent diabetes mellitus type 2, hypertension, hyperlipidemia, mild CAD per cath 2015, history of ASD repair 1972, cigarette smoking.   Referred to the office back in August 2023 given his history of ASD repair and mild CAD.  Patient underwent ASD repair in 1972 and noted to have mild CAD based on angiography back in 2015.  Since then clinically he has done well.  Since establishing care he has undergone ischemic workup and echocardiogram with bubble study was negative for PFO or ASD.  Since last office visit Chris Long denies any anginal chest pain or heart failure symptoms.   No hospitalizations or urgent care visits for cardiovascular reasons.   He has been compliant with his medical therapy and endorses no concerns.  Physical endurance remains stable -enjoys walking 2 miles at least 5 times a week.  Unfortunately he continues to smoke 0.75 packs/day.  Tried Chantix , Wellbutrin, and Nicotine patches.  Since the last visit he was dx with bladder cancer and currently undergoing treatments.  He also had a lung cancer screening at [facility] since the last visit.   Mother had a myocardial infarction at the age of 41 and also had a ASD.  Review of Systems: .   Review of Systems  Cardiovascular:  Negative for chest pain, claudication, irregular heartbeat, leg swelling, near-syncope,  orthopnea, palpitations, paroxysmal nocturnal dyspnea and syncope.  Respiratory:  Negative for shortness of breath.   Hematologic/Lymphatic: Negative for bleeding problem.    Studies Reviewed:   EKG: EKG Interpretation Date/Time:  Monday December 05 2024 08:16:11 EST Ventricular Rate:  89 PR Interval:  192 QRS Duration:  96 QT Interval:  372 QTC Calculation: 452 R Axis:   26  Text Interpretation: Normal sinus rhythm Normal ECG When compared with ECG of 06-Nov-2023 08:33, No significant change was found Confirmed by Large Madonna [phone]) on 12/05/2024 8:33:28 AM  Echocardiogram: November 2023: LVEF 53%, grade 1 diastolic dysfunction, mild TR, bubble study negative for PFO.  See report for additional details  Stress Testing: Exercise treadmill stress test November 2023: Achieved 8.5 METS, 91% of APMHR, stress ECG no ischemic changes per report, lower study  RADIOLOGY: N/A  Risk Assessment/Calculations:   NA   Labs:    External Labs: Collected: 06/24/2022 provided by referring physician. Hemoglobin A1c 7.2   External Labs: Collected: 09/17/2021 provided by patient. Total cholesterol 172, triglycerides 221, HDL 37, LDL 91, non-HDL 135.   External Labs: Collected: 09/29/2022 provided by the patient BUN 19, creatinine 1. eGFR 76 mL/min. Sodium 139, potassium 4.6, chloride 104, bicarb 25 AST 22, ALT 31, alkaline phosphatase 56. TSH 3.79 A1c 6.8 Total cholesterol 97, triglycerides 180, HDL 34, LDL 27, non-HDL 63  External Labs: Collected: November 2024 available in Care Everywhere. A1c 7.2 Hemoglobin 15.2 Total cholesterol 101, triglycerides 223, HDL 34, LDL calculated 32 BUN 24, creatinine 1. Sodium 137, potassium  4.8, chloride 101, bicarb 22, AST, ALT, alkaline phosphatase within normal limits.     Latest Ref Rng & Units 03/25/2024   10:12 AM 12/30/2023   11:12 AM 09/13/2014    1:15 PM  CMP  Glucose 70 - 99 mg/dL 886  848  885   BUN 8 - 23 mg/dL 23  19  15     Creatinine 0.61 - 1.24 mg/dL 8.79  8.95  9.09   Sodium 135 - 145 mmol/L 136  142  139   Potassium 3.5 - 5.1 mmol/L 3.9  4.4  4.7   Chloride 98 - 111 mmol/L 103  103  102   CO2 22 - 32 mmol/L 23  19  23    Calcium  8.9 - 10.3 mg/dL 9.4  9.7  9.7   Total Protein 6.0 - 8.5 g/dL  7.0  7.2   Total Bilirubin 0.0 - 1.2 mg/dL  0.2  0.3   Alkaline Phos 44 - 121 IU/L  60  60   AST 0 - 40 IU/L  17  21   ALT 0 - 44 IU/L  12  32    Lab Results  Component Value Date   CHOL 86 (L) 09/06/2024   HDL 39 (L) 09/06/2024   LDLCALC 24 09/06/2024   LDLDIRECT 74 12/30/2023   TRIG 132 09/06/2024   CHOLHDL 2.2 09/06/2024     Physical Exam:    Today's Vitals   12/05/24 0819  BP: 110/82  Pulse: 89  SpO2: 98%  Weight: 214 lb (97.1 kg)  Height: 6' 1 (1.854 m)   Body mass index is 28.23 kg/m. Wt Readings from Last 3 Encounters:  12/05/24 214 lb (97.1 kg)  09/15/24 221 lb (100.2 kg)  03/25/24 213 lb (96.6 kg)    Physical Exam  Constitutional: No distress.  hemodynamically stable  Neck: No JVD present.  Cardiovascular: Normal rate, regular rhythm, S1 normal and S2 normal. Exam reveals no gallop, no S3 and no S4.  No murmur heard. Pulmonary/Chest: Effort normal and breath sounds normal. No stridor. He has no wheezes. He has no rales.  Abdominal: Soft. Bowel sounds are normal. He exhibits no distension. There is no abdominal tenderness.  Musculoskeletal:        General: No edema.     Cervical back: Neck supple.  Neurological: He is alert and oriented to person, place, and time. He has intact cranial nerves (2-12).  Skin: Skin is warm.     Impression & Recommendation(s):  Impression:   ICD-10-CM   1. Coronary artery disease, unspecified vessel or lesion type, unspecified whether angina present, unspecified whether native or transplanted heart  I25.10 EKG 12-Lead    2. H/O congenital atrial septal defect (ASD) repair  Z87.74     3. Non-insulin  dependent type 2 diabetes mellitus (HCC)   E11.9     4. Mixed hyperlipidemia  E78.2     5. Cigarette smoker  F17.210        Recommendation(s):  Coronary artery disease involving native coronary artery of native heart without angina pectoris Noted to have mild CAD as per his prior angiogram in 2015. Denies anginal chest pain or heart failure symptoms. EKG today independently reviewed, nonischemic Prior GXT low risk. Prior echo noted preserved LVEF, and bubble study negative for PFO or ASD. Recent lipid profile from October 2025 independently reviewed lipids and triglycerides are well-managed No additional testing warranted at this time Patient requesting refill on Lopressor .  Will transition him to Toprol -XL 50 mg p.o.  every morning Reemphasized importance of improving modifiable cardiovascular risk factors, including blood pressure, glycemic control, complete smoking cessation, and lipid management.  H/O congenital atrial septal defect (ASD) repair Repair in 1972. Recent echo noted no flow across the repair as per the bubble study. Monitor for now  Non-insulin  dependent type 2 diabetes mellitus (HCC) Currently on ARB, Farxiga, Vascepa , statin therapy, Ozempic , Repatha  Reemphasized importance of glycemic control  Mixed hyperlipidemia Currently on rosuvastatin  40 mg p.o. daily, Vascepa , Repatha  Lipids from October 2025 reviewed, LDL 24 mg/dL and triglycerides 867 mg/dL Will decrease Crestor  to 20 mg p.o. daily Patient requesting refill on Vascepa  for 90-day supply Patient is currently comanaged with our Pharm.D. clinic. Most recent lipids dated October 2025, independently reviewed as noted above.  Cigarette smoker Tobacco cessation counseling: Currently smoking 0.75 packs/day   Has tried Chantix , Wellbutrin, nicotine patches (caused a rash)  We discussed other modalities that can be utilized for smoking cessation; however, patient states that it is difficult but will discuss with PCP  7 mins were spent counseling  patient cessation techniques.  I will reassess his progress at the next follow-up visit   Orders Placed:  Orders Placed This Encounter  Procedures   EKG 12-Lead    Final Medication List:    Meds ordered this encounter  Medications   icosapent  Ethyl (VASCEPA ) 1 g capsule    Sig: Take 2 capsules (2 g total) by mouth 2 (two) times daily.    Dispense:  360 capsule    Refill:  3   rosuvastatin  (CRESTOR ) 20 MG tablet    Sig: Take 1 tablet (20 mg total) by mouth daily.    Dispense:  90 tablet    Refill:  3   metoprolol  succinate (TOPROL -XL) 50 MG 24 hr tablet    Sig: Take 1 tablet (50 mg total) by mouth daily. Take with or immediately following a meal.    Dispense:  90 tablet    Refill:  0    Medications Discontinued During This Encounter  Medication Reason   rosuvastatin  (CRESTOR ) 40 MG tablet Dose change   metoprolol  tartrate (LOPRESSOR ) 25 MG tablet Change in therapy   icosapent  Ethyl (VASCEPA ) 1 g capsule Reorder      Current Outpatient Medications:    aspirin  EC 81 MG tablet, Take 1 tablet (81 mg total) by mouth daily. Swallow whole. (Patient taking differently: Take 81 mg by mouth every evening. Swallow whole.), Disp: 30 tablet, Rfl: 12   Carboxymethylcellulose Sod PF (THERATEARS) 1 % GEL, Place 1 drop into both eyes in the morning., Disp: , Rfl:    COENZYME Q10 PO, Take 1 capsule by mouth in the morning., Disp: , Rfl:    dapagliflozin propanediol (FARXIGA) 10 MG TABS tablet, Take 10 mg by mouth in the morning., Disp: , Rfl:    Evolocumab  (REPATHA  SURECLICK) 140 MG/ML SOAJ, Inject 140 mg into the skin every 14 (fourteen) days., Disp: 6 mL, Rfl: 3   levothyroxine (SYNTHROID) 112 MCG tablet, Take 112 mcg by mouth in the morning., Disp: , Rfl:    losartan (COZAAR) 25 MG tablet, Take 12.5 mg by mouth every evening., Disp: , Rfl:    metFORMIN (GLUCOPHAGE-XR) 500 MG 24 hr tablet, Take 500 mg by mouth in the morning and at bedtime., Disp: , Rfl: 6   metoprolol  succinate (TOPROL -XL)  50 MG 24 hr tablet, Take 1 tablet (50 mg total) by mouth daily. Take with or immediately following a meal., Disp: 90 tablet, Rfl: 0  OVER THE COUNTER MEDICATION, Take 2 capsules by mouth in the morning. Turkey Tail Mushrooms, Disp: , Rfl:    OZEMPIC , 2 MG/DOSE, 8 MG/3ML SOPN, Inject 2mg  into the skin once a week., Disp: 3 mL, Rfl: 5   PSYLLIUM PO, Take 2 capsules by mouth in the morning., Disp: , Rfl:    rosuvastatin  (CRESTOR ) 20 MG tablet, Take 1 tablet (20 mg total) by mouth daily., Disp: 90 tablet, Rfl: 3   icosapent  Ethyl (VASCEPA ) 1 g capsule, Take 2 capsules (2 g total) by mouth 2 (two) times daily., Disp: 360 capsule, Rfl: 3  Consent:   NA  Disposition:   1 year follow-up  His questions and concerns were addressed to his satisfaction. He voices understanding of the recommendations provided during this encounter.    Signed, Madonna Michele HAS, [facility]  A Division of [facility] [address], [place], [place] [address]   12/05/2024 8:56 AM "
